# Patient Record
Sex: Female | Born: 1950 | Race: White | Hispanic: No | Smoking: Former smoker
Health system: Southern US, Community
[De-identification: ages and names within clinical notes are randomized; demographics above are authoritative.]

## PROBLEM LIST (undated history)

## (undated) DIAGNOSIS — K219 Gastro-esophageal reflux disease without esophagitis: Secondary | ICD-10-CM

## (undated) DIAGNOSIS — R569 Unspecified convulsions: Secondary | ICD-10-CM

## (undated) DIAGNOSIS — M519 Unspecified thoracic, thoracolumbar and lumbosacral intervertebral disc disorder: Secondary | ICD-10-CM

## (undated) DIAGNOSIS — E039 Hypothyroidism, unspecified: Secondary | ICD-10-CM

## (undated) DIAGNOSIS — Z8669 Personal history of other diseases of the nervous system and sense organs: Secondary | ICD-10-CM

## (undated) DIAGNOSIS — E785 Hyperlipidemia, unspecified: Secondary | ICD-10-CM

## (undated) DIAGNOSIS — R748 Abnormal levels of other serum enzymes: Secondary | ICD-10-CM

## (undated) DIAGNOSIS — M810 Age-related osteoporosis without current pathological fracture: Secondary | ICD-10-CM

## (undated) DIAGNOSIS — F17200 Nicotine dependence, unspecified, uncomplicated: Secondary | ICD-10-CM

## (undated) HISTORY — DX: Gastro-esophageal reflux disease without esophagitis: K21.9

## (undated) HISTORY — DX: Abnormal levels of other serum enzymes: R74.8

## (undated) HISTORY — DX: Hyperlipidemia, unspecified: E78.5

## (undated) HISTORY — DX: Age-related osteoporosis without current pathological fracture: M81.0

## (undated) HISTORY — DX: Unspecified thoracic, thoracolumbar and lumbosacral intervertebral disc disorder: M51.9

## (undated) HISTORY — PX: BREAST EXCISIONAL BIOPSY: SUR124

## (undated) HISTORY — PX: EYE SURGERY: SHX253

## (undated) HISTORY — DX: Hypothyroidism, unspecified: E03.9

## (undated) HISTORY — DX: Nicotine dependence, unspecified, uncomplicated: F17.200

## (undated) HISTORY — PX: REDUCTION MAMMAPLASTY: SUR839

## (undated) HISTORY — DX: Unspecified convulsions: R56.9

## (undated) HISTORY — PX: COLONOSCOPY: SHX174

## (undated) HISTORY — DX: Personal history of other diseases of the nervous system and sense organs: Z86.69

---

## 1998-07-24 ENCOUNTER — Emergency Department (HOSPITAL_COMMUNITY): Admission: EM | Admit: 1998-07-24 | Discharge: 1998-07-24 | Payer: Self-pay | Admitting: Emergency Medicine

## 2002-02-09 ENCOUNTER — Encounter: Payer: Self-pay | Admitting: Orthopedic Surgery

## 2002-02-09 ENCOUNTER — Ambulatory Visit (HOSPITAL_COMMUNITY): Admission: RE | Admit: 2002-02-09 | Discharge: 2002-02-09 | Payer: Self-pay | Admitting: Orthopedic Surgery

## 2002-02-15 ENCOUNTER — Emergency Department (HOSPITAL_COMMUNITY): Admission: EM | Admit: 2002-02-15 | Discharge: 2002-02-15 | Payer: Self-pay | Admitting: Emergency Medicine

## 2006-06-14 ENCOUNTER — Emergency Department (HOSPITAL_COMMUNITY): Admission: EM | Admit: 2006-06-14 | Discharge: 2006-06-15 | Payer: Self-pay | Admitting: Emergency Medicine

## 2009-11-20 HISTORY — PX: CHOLECYSTECTOMY: SHX55

## 2010-11-12 ENCOUNTER — Observation Stay (HOSPITAL_COMMUNITY): Admission: EM | Admit: 2010-11-12 | Discharge: 2010-11-12 | Payer: Self-pay | Source: Home / Self Care

## 2010-11-12 ENCOUNTER — Encounter (INDEPENDENT_AMBULATORY_CARE_PROVIDER_SITE_OTHER): Payer: Self-pay

## 2011-01-30 LAB — URINALYSIS, ROUTINE W REFLEX MICROSCOPIC
Bilirubin Urine: NEGATIVE
Glucose, UA: NEGATIVE mg/dL
Ketones, ur: NEGATIVE mg/dL
Leukocytes, UA: NEGATIVE
Nitrite: NEGATIVE
Protein, ur: NEGATIVE mg/dL
Specific Gravity, Urine: 1.013 (ref 1.005–1.030)
Urobilinogen, UA: 0.2 mg/dL (ref 0.0–1.0)
pH: 6 (ref 5.0–8.0)

## 2011-01-30 LAB — COMPREHENSIVE METABOLIC PANEL
AST: 29 U/L (ref 0–37)
Albumin: 4.2 g/dL (ref 3.5–5.2)
Alkaline Phosphatase: 113 U/L (ref 39–117)
BUN: 7 mg/dL (ref 6–23)
GFR calc Af Amer: >60 mL/min (ref >60)
Potassium: 3.8 mEq/L (ref 3.5–5.1)
Total Protein: 7.3 g/dL (ref 6.0–8.3)

## 2011-01-30 LAB — DIFFERENTIAL
Basophils Relative: 0 % (ref 0–1)
Eosinophils Relative: 4 % (ref 0–5)
Lymphocytes Relative: 22 % (ref 12–46)
Monocytes Absolute: 1.2 10*3/uL — ABNORMAL HIGH (ref 0.1–1.0)
Monocytes Relative: 11 % (ref 3–12)
Neutro Abs: 6.8 10*3/uL (ref 1.7–7.7)

## 2011-01-30 LAB — CBC
MCV: 93.4 fL (ref 78.0–100.0)
Platelets: 265 10*3/uL (ref 150–400)
RBC: 4.38 MIL/uL (ref 3.87–5.11)
RDW: 13.6 % (ref 11.5–15.5)
WBC: 10.9 10*3/uL — ABNORMAL HIGH (ref 4.0–10.5)

## 2011-01-30 LAB — URINE MICROSCOPIC-ADD ON

## 2011-02-21 NOTE — Discharge Summary (Signed)
  NAME:  Lori Montoya, BUCHANAN                 ACCOUNT NO.:  [id number]  MEDICAL RECORD NO.:  [id number]          PATIENT TYPE:  OBV  LOCATION:  5120                         FACILITY:  [facility]  PHYSICIAN:  Abigail Miyamoto, M.D. DATE OF BIRTH:  04/24/1951  DATE OF ADMISSION:  11/12/2010 DATE OF DISCHARGE:  11/13/2010                              DISCHARGE SUMMARY   DISCHARGE DIAGNOSIS:  Acute cholecystitis with cholelithiasis.  SUMMARY OF HISTORY:  This is a 60 year old female who presents with right upper quadrant abdominal pain.  She was found on ultrasound to have findings consistent with acute cholecystitis and cholelithiasis. Decision was made to admit to the hospital.  HOSPITAL COURSE:  The patient was admitted and taken to the operating room on November 12, 2010.  She underwent a laparoscopic cholecystectomy.  She tolerated this well and was taken to the regular surgical floor.  She had an uneventful postoperative recovery and on postoperative day #1, was doing quite well.  Her incisions were healing well and the decision was made to discharge the patient home.  DISCHARGE DIET:  Regular.  DISCHARGE ACTIVITY:  As tolerated.  She is to do no heavy lifting for 2 weeks after discharge.  She may shower.  She will follow up with [facility] in 2 weeks post discharge.     Abigail Miyamoto, M.D.     DB/MEDQ  D:  12/22/2010  T:  12/23/2010  Job:  [id number]  Electronically Signed by Abigail Miyamoto M.D. on 01/04/2011 07:28:35 PM

## 2014-05-18 ENCOUNTER — Institutional Professional Consult (permissible substitution): Payer: Self-pay | Admitting: Cardiovascular Disease

## 2014-05-20 ENCOUNTER — Encounter: Payer: Self-pay | Admitting: Physician Assistant

## 2014-06-01 LAB — HM COLONOSCOPY

## 2014-06-12 ENCOUNTER — Other Ambulatory Visit: Payer: Self-pay | Admitting: Physician Assistant

## 2014-06-12 DIAGNOSIS — N951 Menopausal and female climacteric states: Secondary | ICD-10-CM

## 2014-06-12 DIAGNOSIS — Z1231 Encounter for screening mammogram for malignant neoplasm of breast: Secondary | ICD-10-CM

## 2014-07-02 ENCOUNTER — Ambulatory Visit
Admission: RE | Admit: 2014-07-02 | Discharge: 2014-07-02 | Disposition: A | Discharge status: Home / Self Care | Payer: BC Managed Care – PPO | Source: Ambulatory Visit | Attending: Physician Assistant | Admitting: Physician Assistant

## 2014-07-02 DIAGNOSIS — Z1231 Encounter for screening mammogram for malignant neoplasm of breast: Secondary | ICD-10-CM

## 2014-07-02 DIAGNOSIS — N951 Menopausal and female climacteric states: Secondary | ICD-10-CM

## 2014-07-02 LAB — HM DEXA SCAN: HM DEXA SCAN: ABNORMAL

## 2014-07-20 ENCOUNTER — Encounter: Payer: Self-pay | Admitting: Cardiology

## 2014-07-20 ENCOUNTER — Ambulatory Visit (INDEPENDENT_AMBULATORY_CARE_PROVIDER_SITE_OTHER): Payer: BC Managed Care – PPO | Admitting: Cardiology

## 2014-07-20 VITALS — BP 118/80 | HR 66 | Ht 67.0 in | Wt 140.0 lb

## 2014-07-20 DIAGNOSIS — I4949 Other premature depolarization: Secondary | ICD-10-CM

## 2014-07-20 DIAGNOSIS — R002 Palpitations: Secondary | ICD-10-CM | POA: Insufficient documentation

## 2014-07-20 DIAGNOSIS — I493 Ventricular premature depolarization: Secondary | ICD-10-CM

## 2014-07-20 NOTE — Patient Instructions (Signed)
Your physician recommends that you continue on your current medications as directed. Please refer to the Current Medication list given to you today.  Your physician has requested that you have an echocardiogram. Echocardiography is a painless test that uses sound waves to create images of your heart. It provides your doctor with information about the size and shape of your heart and how well your heart's chambers and valves are working. This procedure takes approximately one hour. There are no restrictions for this procedure.  Your physician has recommended that you wear a holter monitor. Holter monitors are medical devices that record the heart's electrical activity. Doctors most often use these monitors to diagnose arrhythmias. Arrhythmias are problems with the speed or rhythm of the heartbeat. The monitor is a small, portable device. You can wear one while you do your normal daily activities. This is usually used to diagnose what is causing palpitations/syncope (passing out). 48 hour  Follow up as needed

## 2014-07-20 NOTE — Progress Notes (Signed)
Lori Montoya Date of Birth:  1951-08-28 [place] [address] [place], [place]  [address] [phone]        Fax   [phone]   History of Present Illness: This pleasant 63 year old woman is seen at the request of Dr. Lisbeth Ply for evaluation of an irregular pulse.  The patient has no history of any known heart disease.  On the morning of June 29 the patient was aware of her heart missing multiple beats.  She called her PCP and obtained an appointment for that afternoon.  By the time she was seen in the office however her heart rhythm had returned to normal and her EKG was unremarkable.  The patient had a colonoscopy on July 13.  She was being monitored for her heart during the procedure.  The monitor showed normal sinus rhythm with bigeminy PVCs.  Since then the patient has not been aware of any subsequent cardiac arrhythmias.  She has been evaluated for hoarseness by Dr. Wilburn Cornelia of the ENT Department who has advised her to cut way back on caffeine intake.  He also suspected acid reflux as a cause of her hoarseness and recommended that she elevate the head of her bed.  Prior to his advice, the patient states that she was drinking coffee "all day long".  Now she has just one large cup of regular coffee in the morning and thereafter drinks decaf the rest of the day. The patient has not been experiencing any chest pain or shortness of breath.  She has not had any dizziness or syncope. Her social history reveals that she has been a widow since 2010.  Her husband was a Software engineer.  The patient is busy taking care of her mother who is an invalid and she also helps to look after her mother-in-law who lives next door. The patient does not drink alcohol.  She quit smoking 3 weeks ago when she began to have problems with hoarseness.  Current Outpatient Prescriptions  Medication Sig Dispense Refill  . Calcium Carbonate ([employer] CALCIUM) 1500 MG TABS Take by mouth.      .  Cholecalciferol (VITAMIN D3) 1000 UNITS CAPS Take by mouth 2 (two) times daily.      Marland Kitchen levothyroxine (SYNTHROID, LEVOTHROID) 88 MCG tablet       . omeprazole (PRILOSEC) 40 MG capsule       . alendronate (FOSAMAX) 70 MG tablet        No current facility-administered medications for this visit.    Not on File  Patient Active Problem List   Diagnosis Date Noted  . Intermittent palpitations 07/20/2014  . 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 Current Some Day Smoker  Smokeless tobacco  . Not on file    Comment: havent smoked in three weeks    History  Alcohol Use: Not on file    Family History  Problem Relation Age of Onset  . Hyperlipidemia Mother   . Hypertension Mother   . CVA Mother   . Cancer - Other Father     Review of Systems: Constitutional: no fever chills diaphoresis or fatigue or change in weight.  Head and neck: no hearing loss, no epistaxis, no photophobia or visual disturbance. Respiratory: No cough, shortness of breath or wheezing. Cardiovascular: No chest pain peripheral edema, palpitations. Gastrointestinal: No abdominal distention, no abdominal pain, no change in bowel habits hematochezia or melena. Genitourinary: No dysuria, no frequency, no urgency, no  nocturia. Musculoskeletal:No arthralgias, no back pain, no gait disturbance or myalgias. Neurological: No dizziness, no headaches, no numbness, no seizures, no syncope, no weakness, no tremors. Hematologic: No lymphadenopathy, no easy bruising. Psychiatric: No confusion, no hallucinations, no sleep disturbance.    Physical Exam: Filed Vitals:   07/20/14 1447  BP: 118/80  Pulse: 66   this is a well-developed well-nourished healthy-appearing woman in no distress.  Voice is slightly hoarse.  Head and neck exam reveals that the pupils are equal and reactive.  The extraocular movements are full.  There is no scleral icterus.  Mouth and pharynx are benign.  No lymphadenopathy.   No carotid bruits.  The jugular venous pressure is normal.  Thyroid is not enlarged or tender.  Chest is clear to percussion and auscultation.  No rales or rhonchi.  Expansion of the chest is symmetrical.  Heart reveals no abnormal lift or heave.  First and second heart sounds are normal.  There is no murmur gallop rub or click.  The abdomen is soft and nontender.  Bowel sounds are normoactive.  There is no hepatosplenomegaly or mass.  There are no abdominal bruits.  Extremities reveal no phlebitis or edema.  Pedal pulses are good.  There is no cyanosis or clubbing.  Neurologic exam is normal strength and no lateralizing weakness.  No sensory deficits.  Integument reveals no rash  EKG today shows normal sinus rhythm with sinus arrhythmia and left axis deviation and incomplete right bundle branch block. Review of her rhythm strips which she brought with her from her July 13 colonoscopy showed normal sinus rhythm with long runs of bigeminy PVCs  Assessment / Plan: 1. palpitations secondary to probable bigeminy PVCs.  Her PVCs have decreased since she cut back on caffeine intake. 2.  Hypothyroidism 3. tobacco abuse.  Nonsmoker for the past 3 weeks. 4. hoarseness possibly secondary to esophageal reflux  Recommendation: We will have the patient return for a two-dimensional echocardiogram.  We will also have her wear a 48 hour Holter monitor to assure that she is not having any worse arrhythmias than just the bigeminy PVCs. No medications were prescribed today. Many thanks for the opportunity to see this pleasant woman with you.  I will be in touch regarding the results of her studies.

## 2014-07-21 ENCOUNTER — Other Ambulatory Visit: Payer: Self-pay | Admitting: *Deleted

## 2014-07-22 LAB — HEPATIC FUNCTION PANEL
ALT: 38 U/L — AB (ref 7–35)
AST: 38 U/L — AB (ref 13–35)
Alkaline Phosphatase: 201 U/L — AB (ref 25–125)
BILIRUBIN, TOTAL: 0.3 mg/dL

## 2014-07-22 LAB — TSH: TSH: 0.49 u[IU]/mL (ref <=5.90)

## 2014-07-22 LAB — HM PAP SMEAR

## 2014-07-22 LAB — CBC AND DIFFERENTIAL
HCT: 42 % (ref 36–46)
HEMOGLOBIN: 13.2 g/dL (ref 12.0–16.0)
Platelets: 266 10*3/uL (ref 150–399)
WBC: 7.7 10^3/mL

## 2014-07-22 LAB — BASIC METABOLIC PANEL
BUN: 8 mg/dL (ref 4–21)
Creatinine: 0.6 mg/dL (ref <=1.1)
GLUCOSE: 84 mg/dL
POTASSIUM: 4.5 mmol/L (ref 3.4–5.3)
Sodium: 138 mmol/L (ref 137–147)

## 2014-07-22 LAB — LIPID PANEL
Cholesterol: 232 mg/dL — AB (ref 0–200)
HDL: 34 mg/dL — AB (ref 35–70)
LDL Cholesterol: 165 mg/dL
TRIGLYCERIDES: 163 mg/dL — AB (ref 40–160)

## 2014-07-22 LAB — VITAMIN D 25 HYDROXY (VIT D DEFICIENCY, FRACTURES): Vit D, 25-Hydroxy: 32.4

## 2014-08-10 ENCOUNTER — Ambulatory Visit (HOSPITAL_COMMUNITY): Payer: BC Managed Care – PPO | Attending: Cardiology

## 2014-08-10 ENCOUNTER — Encounter: Payer: Self-pay | Admitting: *Deleted

## 2014-08-10 ENCOUNTER — Encounter (INDEPENDENT_AMBULATORY_CARE_PROVIDER_SITE_OTHER): Payer: BC Managed Care – PPO

## 2014-08-10 DIAGNOSIS — I080 Rheumatic disorders of both mitral and aortic valves: Secondary | ICD-10-CM | POA: Diagnosis not present

## 2014-08-10 DIAGNOSIS — I4949 Other premature depolarization: Secondary | ICD-10-CM

## 2014-08-10 DIAGNOSIS — I493 Ventricular premature depolarization: Secondary | ICD-10-CM

## 2014-08-10 DIAGNOSIS — Z87891 Personal history of nicotine dependence: Secondary | ICD-10-CM | POA: Insufficient documentation

## 2014-08-10 DIAGNOSIS — R002 Palpitations: Secondary | ICD-10-CM

## 2014-08-10 NOTE — Progress Notes (Signed)
2D Echo completed. 08/10/2014

## 2014-08-10 NOTE — Progress Notes (Signed)
Patient ID: Lori Montoya, female   DOB: April 24, 1951, 63 y.o.   MRN: [id number] [employer] 48 hour holter monitor applied to patient.

## 2014-08-19 ENCOUNTER — Telehealth: Payer: Self-pay | Admitting: *Deleted

## 2014-08-19 NOTE — Telephone Encounter (Signed)
Advised patient of results.  

## 2014-08-19 NOTE — Telephone Encounter (Signed)
Left message to call back   48 hour monitored reviewed by  Dr. Mare Ferrari, interpretation:  Rare PVC's and PAC's, no dangerous arrhythmia

## 2014-08-31 ENCOUNTER — Other Ambulatory Visit: Payer: Self-pay | Admitting: Family Medicine

## 2014-08-31 DIAGNOSIS — R1011 Right upper quadrant pain: Secondary | ICD-10-CM

## 2014-09-08 ENCOUNTER — Ambulatory Visit
Admission: RE | Admit: 2014-09-08 | Discharge: 2014-09-08 | Disposition: A | Discharge status: Home / Self Care | Payer: BC Managed Care – PPO | Source: Ambulatory Visit | Attending: Family Medicine | Admitting: Family Medicine

## 2014-09-08 DIAGNOSIS — R1011 Right upper quadrant pain: Secondary | ICD-10-CM

## 2014-12-30 ENCOUNTER — Encounter (HOSPITAL_BASED_OUTPATIENT_CLINIC_OR_DEPARTMENT_OTHER): Payer: Self-pay | Admitting: *Deleted

## 2014-12-30 NOTE — Progress Notes (Signed)
No labs needed Saw dr Mare Ferrari 9/15 for pvc-no meds started

## 2015-01-01 ENCOUNTER — Encounter (HOSPITAL_BASED_OUTPATIENT_CLINIC_OR_DEPARTMENT_OTHER): Payer: Self-pay | Admitting: *Deleted

## 2015-01-01 ENCOUNTER — Ambulatory Visit (HOSPITAL_BASED_OUTPATIENT_CLINIC_OR_DEPARTMENT_OTHER): Payer: BLUE CROSS/BLUE SHIELD | Admitting: Anesthesiology

## 2015-01-01 ENCOUNTER — Encounter (HOSPITAL_BASED_OUTPATIENT_CLINIC_OR_DEPARTMENT_OTHER)
Admission: RE | Disposition: A | Discharge status: Home / Self Care | Payer: Self-pay | Source: Ambulatory Visit | Attending: Otolaryngology

## 2015-01-01 ENCOUNTER — Ambulatory Visit (HOSPITAL_BASED_OUTPATIENT_CLINIC_OR_DEPARTMENT_OTHER)
Admission: RE | Admit: 2015-01-01 | Discharge: 2015-01-01 | Disposition: A | Discharge status: Home / Self Care | Payer: BLUE CROSS/BLUE SHIELD | Source: Ambulatory Visit | Attending: Otolaryngology | Admitting: Otolaryngology

## 2015-01-01 DIAGNOSIS — J382 Nodules of vocal cords: Secondary | ICD-10-CM

## 2015-01-01 DIAGNOSIS — D141 Benign neoplasm of larynx: Secondary | ICD-10-CM | POA: Diagnosis not present

## 2015-01-01 DIAGNOSIS — J449 Chronic obstructive pulmonary disease, unspecified: Secondary | ICD-10-CM | POA: Diagnosis not present

## 2015-01-01 DIAGNOSIS — E039 Hypothyroidism, unspecified: Secondary | ICD-10-CM | POA: Insufficient documentation

## 2015-01-01 DIAGNOSIS — Z87891 Personal history of nicotine dependence: Secondary | ICD-10-CM | POA: Diagnosis not present

## 2015-01-01 HISTORY — PX: MICROLARYNGOSCOPY: SHX5208

## 2015-01-01 LAB — POCT HEMOGLOBIN-HEMACUE: HEMOGLOBIN: 10.6 g/dL — AB (ref 12.0–15.0)

## 2015-01-01 SURGERY — MICROLARYNGOSCOPY
Anesthesia: General | Site: Mouth | Laterality: Left

## 2015-01-01 MED ORDER — FENTANYL CITRATE 0.05 MG/ML IJ SOLN
INTRAMUSCULAR | Status: DC | PRN
  Administered 2015-01-01 (×2): 50 ug via INTRAVENOUS
  Administered 2015-01-01: 25 ug via INTRAVENOUS

## 2015-01-01 MED ORDER — MIDAZOLAM HCL 2 MG/2ML IJ SOLN: 1.0000 mg | INTRAMUSCULAR | Status: DC | PRN

## 2015-01-01 MED ORDER — LIDOCAINE-EPINEPHRINE 1 %-1:100000 IJ SOLN
INTRAMUSCULAR | Status: AC
  Filled 2015-01-01: qty 1

## 2015-01-01 MED ORDER — DEXAMETHASONE SODIUM PHOSPHATE 4 MG/ML IJ SOLN
INTRAMUSCULAR | Status: DC | PRN
  Administered 2015-01-01: 10 mg via INTRAVENOUS

## 2015-01-01 MED ORDER — MIDAZOLAM HCL 5 MG/5ML IJ SOLN
INTRAMUSCULAR | Status: DC | PRN
  Administered 2015-01-01: 2 mg via INTRAVENOUS

## 2015-01-01 MED ORDER — LIDOCAINE HCL (CARDIAC) 10 MG/ML IV SOLN
INTRAVENOUS | Status: DC | PRN
  Administered 2015-01-01: 60 mg via INTRAVENOUS

## 2015-01-01 MED ORDER — EPINEPHRINE HCL 1 MG/ML IJ SOLN
INTRAMUSCULAR | Status: DC | PRN
  Administered 2015-01-01: 1 mg

## 2015-01-01 MED ORDER — SUCCINYLCHOLINE CHLORIDE 20 MG/ML IJ SOLN
INTRAMUSCULAR | Status: DC | PRN
  Administered 2015-01-01: 100 mg via INTRAVENOUS

## 2015-01-01 MED ORDER — ACETAMINOPHEN 325 MG PO TABS
ORAL_TABLET | ORAL | Status: AC
  Filled 2015-01-01: qty 2

## 2015-01-01 MED ORDER — ACETAMINOPHEN 325 MG PO TABS
650.0000 mg | ORAL_TABLET | Freq: Once | ORAL | Status: AC
  Administered 2015-01-01: 650 mg via ORAL

## 2015-01-01 MED ORDER — LIDOCAINE HCL 4 % MT SOLN
OROMUCOSAL | Status: DC | PRN
  Administered 2015-01-01: 3 mL via TOPICAL

## 2015-01-01 MED ORDER — MIDAZOLAM HCL 2 MG/2ML IJ SOLN
INTRAMUSCULAR | Status: AC
  Filled 2015-01-01: qty 2

## 2015-01-01 MED ORDER — HYDROMORPHONE HCL 1 MG/ML IJ SOLN: 0.2500 mg | INTRAMUSCULAR | Status: DC | PRN

## 2015-01-01 MED ORDER — FENTANYL CITRATE 0.05 MG/ML IJ SOLN
INTRAMUSCULAR | Status: AC
  Filled 2015-01-01: qty 6

## 2015-01-01 MED ORDER — LACTATED RINGERS IV SOLN
INTRAVENOUS | Status: DC
  Administered 2015-01-01: 08:00:00 via INTRAVENOUS

## 2015-01-01 MED ORDER — ONDANSETRON HCL 4 MG/2ML IJ SOLN: 4.0000 mg | Freq: Once | INTRAMUSCULAR | Status: DC | PRN

## 2015-01-01 MED ORDER — ONDANSETRON HCL 4 MG/2ML IJ SOLN
INTRAMUSCULAR | Status: DC | PRN
  Administered 2015-01-01: 4 mg via INTRAVENOUS

## 2015-01-01 MED ORDER — OXYCODONE HCL 5 MG PO TABS: 5.0000 mg | ORAL_TABLET | Freq: Once | ORAL | Status: DC | PRN

## 2015-01-01 MED ORDER — SCOPOLAMINE 1 MG/3DAYS TD PT72
1.0000 | MEDICATED_PATCH | TRANSDERMAL | Status: DC
  Administered 2015-01-01 (×2): 1.5 mg via TRANSDERMAL

## 2015-01-01 MED ORDER — PROPOFOL 10 MG/ML IV BOLUS
INTRAVENOUS | Status: DC | PRN
  Administered 2015-01-01: 200 mg via INTRAVENOUS

## 2015-01-01 MED ORDER — EPINEPHRINE HCL 1 MG/ML IJ SOLN
INTRAMUSCULAR | Status: AC
  Filled 2015-01-01: qty 1

## 2015-01-01 MED ORDER — OXYCODONE HCL 5 MG/5ML PO SOLN: 5.0000 mg | Freq: Once | ORAL | Status: DC | PRN

## 2015-01-01 MED ORDER — FENTANYL CITRATE 0.05 MG/ML IJ SOLN: 50.0000 ug | INTRAMUSCULAR | Status: DC | PRN

## 2015-01-01 MED ORDER — SCOPOLAMINE 1 MG/3DAYS TD PT72
MEDICATED_PATCH | TRANSDERMAL | Status: AC
  Filled 2015-01-01: qty 1

## 2015-01-01 SURGICAL SUPPLY — 22 items
CANISTER SUCT 1200ML W/VALVE (MISCELLANEOUS) ×2 IMPLANT
CONT SPEC 4OZ CLIKSEAL STRL BL (MISCELLANEOUS) IMPLANT
GLOVE BIOGEL M 7.0 STRL (GLOVE) ×2 IMPLANT
GLOVE SURG SS PI 7.0 STRL IVOR (GLOVE) ×1 IMPLANT
GOWN STRL REUS W/ TWL LRG LVL3 (GOWN DISPOSABLE) IMPLANT
GOWN STRL REUS W/TWL LRG LVL3 (GOWN DISPOSABLE) ×2
GUARD TEETH (MISCELLANEOUS) IMPLANT
MARKER SKIN DUAL TIP RULER LAB (MISCELLANEOUS) IMPLANT
NDL SAFETY ECLIPSE 18X1.5 (NEEDLE) IMPLANT
NDL SPNL 22GX7 QUINCKE BK (NEEDLE) IMPLANT
NEEDLE HYPO 18GX1.5 SHARP (NEEDLE) ×2
NEEDLE SPNL 22GX7 QUINCKE BK (NEEDLE) IMPLANT
PAD ALCOHOL SWAB (MISCELLANEOUS) IMPLANT
PATTIES SURGICAL .5 X3 (DISPOSABLE) ×2 IMPLANT
SHEET MEDIUM DRAPE 40X70 STRL (DRAPES) ×2 IMPLANT
SOLUTION BUTLER CLEAR DIP (MISCELLANEOUS) ×1 IMPLANT
SPONGE GAUZE 4X4 12PLY STER LF (GAUZE/BANDAGES/DRESSINGS) ×2 IMPLANT
SURGILUBE 2OZ TUBE FLIPTOP (MISCELLANEOUS) IMPLANT
SYR 5ML LL (SYRINGE) IMPLANT
SYR CONTROL 10ML LL (SYRINGE) IMPLANT
TOWEL OR 17X24 6PK STRL BLUE (TOWEL DISPOSABLE) ×2 IMPLANT
TUBE CONNECTING 20X1/4 (TUBING) ×2 IMPLANT

## 2015-01-01 NOTE — Anesthesia Preprocedure Evaluation (Signed)
Anesthesia Evaluation  Patient identified by MRN, date of birth, ID band Patient awake    Reviewed: Allergy & Precautions, NPO status , Patient's Chart, lab work & pertinent test results  Airway Mallampati: II  TM Distance: >3 FB Neck ROM: Full  Mouth opening: Limited Mouth Opening  Dental  (+) Teeth Intact, Dental Advisory Given   Pulmonary COPDformer smoker,  breath sounds clear to auscultation        Cardiovascular Rhythm:Regular Rate:Normal     Neuro/Psych    GI/Hepatic   Endo/Other  Hypothyroidism   Renal/GU      Musculoskeletal   Abdominal   Peds  Hematology   Anesthesia Other Findings   Reproductive/Obstetrics                             Anesthesia Physical Anesthesia Plan  ASA: II  Anesthesia Plan: General   Post-op Pain Management:    Induction: Intravenous  Airway Management Planned: Oral ETT  Additional Equipment:   Intra-op Plan:   Post-operative Plan: Extubation in OR  Informed Consent: I have reviewed the patients History and Physical, chart, labs and discussed the procedure including the risks, benefits and alternatives for the proposed anesthesia with the patient or authorized representative who has indicated his/her understanding and acceptance.   Dental advisory given  Plan Discussed with: CRNA, Anesthesiologist and Surgeon  Anesthesia Plan Comments:         Anesthesia Quick Evaluation

## 2015-01-01 NOTE — H&P (Signed)
Lori Montoya is an 64 y.o. female.   Chief Complaint: Chronic hoarseness HPI: Vocal cord nodule, Chronic hoarseness  Past Medical History  Diagnosis Date  . Personal history of other disorders of nervous system and sense organs   . Unspecified hypothyroidism   . Other and unspecified disc disorder of lumbar region   . Tobacco use disorder   . COPD (chronic obstructive pulmonary disease)   . Dysrhythmia     hx pvc pac    Past Surgical History  Procedure Laterality Date  . Cholecystectomy  2011    lap choli  . Colonoscopy      Family History  Problem Relation Age of Onset  . Hyperlipidemia Mother   . Hypertension Mother   . CVA Mother   . Cancer - Other Father    Social History:  reports that she quit smoking about 2 months ago. She does not have any smokeless tobacco history on file. She reports that she does not drink alcohol or use illicit drugs.  Allergies: No Known Allergies  Medications Prior to Admission  Medication Sig Dispense Refill  . alendronate (FOSAMAX) 70 MG tablet     . Calcium Carbonate ([employer] CALCIUM) 1500 MG TABS Take by mouth.    . Cholecalciferol (VITAMIN D3) 1000 UNITS CAPS Take by mouth 2 (two) times daily.    Marland Kitchen levothyroxine (SYNTHROID, LEVOTHROID) 88 MCG tablet     . omeprazole (PRILOSEC) 40 MG capsule     . Ox Bile Extract POWD by Does not apply route.      Results for orders placed or performed during the hospital encounter of 01/01/15 (from the past 48 hour(s))  Hemoglobin-hemacue, POC     Status: Abnormal   Collection Time: 01/01/15  7:13 AM  Result Value Ref Range   Hemoglobin 10.6 (L) 12.0 - 15.0 g/dL   No results found.  Review of Systems  Constitutional: Negative.   HENT: Negative.        Chronic hoarseness  Respiratory: Negative.   Cardiovascular: Negative.   Gastrointestinal: Negative.     Blood pressure 126/53, pulse 71, temperature 97.5 F (36.4 C), temperature source Oral, resp. rate 18, height 5\' 7"  (1.702 m), weight  63.504 kg (140 lb), SpO2 100 %. Physical Exam  Constitutional: She is oriented to person, place, and time. She appears well-developed and well-nourished.  HENT:  Vocal cord nodule  Neck: Normal range of motion. Neck supple.  Cardiovascular: Normal rate.   Respiratory: Effort normal.  GI: Soft.  Musculoskeletal: Normal range of motion.  Neurological: She is alert and oriented to person, place, and time.     Assessment/Plan Adm for OP Micro-DL and exc bx  Lori Montoya 01/01/2015, 7:31 AM

## 2015-01-01 NOTE — Anesthesia Postprocedure Evaluation (Signed)
  Anesthesia Post-op Note  Patient: Lori Montoya  Procedure(s) Performed: Procedure(s): MICROLARYNGOSCOPY WITH EXCISION OF LEFT VOCAL CORD NODULE  (Left)  Patient Location: PACU  Anesthesia Type: General   Level of Consciousness: awake, alert  and oriented  Airway and Oxygen Therapy: Patient Spontanous Breathing  Post-op Pain: none  Post-op Assessment: Post-op Vital signs reviewed  Post-op Vital Signs: Reviewed  Last Vitals:  Filed Vitals:   01/01/15 0939  BP: 143/64  Pulse: 73  Temp: 36.5 C  Resp: 16    Complications: No apparent anesthesia complications

## 2015-01-01 NOTE — Transfer of Care (Signed)
Immediate Anesthesia Transfer of Care Note  Patient: Lori Montoya  Procedure(s) Performed: Procedure(s): MICROLARYNGOSCOPY WITH EXCISION OF LEFT VOCAL CORD NODULE  (Left)  Patient Location: PACU  Anesthesia Type:General  Level of Consciousness: awake, sedated and patient cooperative  Airway & Oxygen Therapy: Patient Spontanous Breathing and Patient connected to face mask oxygen  Post-op Assessment: Report given to RN and Post -op Vital signs reviewed and stable  Post vital signs: Reviewed and stable  Last Vitals:  Filed Vitals:   01/01/15 0615  BP: 126/53  Pulse: 71  Temp: 36.4 C  Resp: 18    Complications: No apparent anesthesia complications

## 2015-01-01 NOTE — Discharge Instructions (Signed)

## 2015-01-01 NOTE — Anesthesia Procedure Notes (Signed)
Procedure Name: Intubation Date/Time: 01/01/2015 7:41 AM Performed by: Lyndee Leo Pre-anesthesia Checklist: Patient identified, Emergency Drugs available, Suction available and Patient being monitored Patient Re-evaluated:Patient Re-evaluated prior to inductionOxygen Delivery Method: Circle System Utilized Preoxygenation: Pre-oxygenation with 100% oxygen Intubation Type: IV induction Ventilation: Mask ventilation without difficulty Tube type: Oral Tube size: 6.0 mm Number of attempts: 1 Airway Equipment and Method: Stylet,  Oral airway and Video-laryngoscopy Placement Confirmation: ETT inserted through vocal cords under direct vision,  positive ETCO2 and breath sounds checked- equal and bilateral Secured at: 20 cm Tube secured with: Tape Dental Injury: Teeth and Oropharynx as per pre-operative assessment  Comments: Small mouth

## 2015-01-01 NOTE — Brief Op Note (Signed)
01/01/2015  8:12 AM  PATIENT:  Lori Montoya  64 y.o. female  PRE-OPERATIVE DIAGNOSIS:  VOCAL CORD NODULE  POST-OPERATIVE DIAGNOSIS:  VOCAL CORD NODULE  PROCEDURE:  Procedure(s): MICROLARYNGOSCOPY WITH EXCISION OF LEFT VOCAL CORD NODULE  (Left)  SURGEON:  Surgeon(s) and Role:    * Jerrell Belfast, MD - Primary  PHYSICIAN ASSISTANT:   ASSISTANTS: none   ANESTHESIA:   general  EBL:   None  BLOOD ADMINISTERED:none  DRAINS: none   LOCAL MEDICATIONS USED:  LIDOCAINE TOPICAL  and Amount: 1.5 ml  SPECIMEN:  Source of Specimen:  Left VC  DISPOSITION OF SPECIMEN:  PATHOLOGY  COUNTS:  YES  TOURNIQUET:  * No tourniquets in log *  DICTATION: .Other Dictation: Dictation Number [id number]  PLAN OF CARE: Discharge to home after PACU  PATIENT DISPOSITION:  PACU - hemodynamically stable.   Delay start of Pharmacological VTE agent (>24hrs) due to surgical blood loss or risk of bleeding: not applicable

## 2015-01-01 NOTE — Progress Notes (Signed)
VS done @ 0910, documented @ 1939

## 2015-01-05 ENCOUNTER — Encounter (HOSPITAL_BASED_OUTPATIENT_CLINIC_OR_DEPARTMENT_OTHER): Payer: Self-pay | Admitting: Otolaryngology

## 2015-01-05 NOTE — Op Note (Signed)
NAME:  Lori Montoya, Lori Montoya                 ACCOUNT NO.:  [id number]  MEDICAL RECORD NO.:  [id number]  LOCATION:                               FACILITY:  [facility]  PHYSICIAN:  Early Chars. Wilburn Cornelia, M.D.DATE OF BIRTH:  01-Aug-1951  DATE OF PROCEDURE:  01/01/2015 DATE OF DISCHARGE:  01/01/2015                              OPERATIVE REPORT   PREOPERATIVE DIAGNOSES: 1. Chronic hoarseness. 2. Left vocal cord cyst.  POSTOPERATIVE DIAGNOSES: 1. Chronic hoarseness. 2. Left vocal cord cyst.  INDICATION FOR SURGERY: 1. Chronic hoarseness. 2. Left vocal cord cyst.  PROCEDURE:  Microlaryngoscopy with excision of left vocal cord cyst.  SURGEON:  Early Chars. Wilburn Cornelia, MD  ANESTHESIA:  General endotracheal.  COMPLICATIONS:  None.  BLOOD LOSS:  None.  The patient was transferred from operating room to recovery room in stable condition.  BRIEF HISTORY:  The patient is a 64 year old white female that has been followed with a history of chronic hoarseness.  She is a long-term smoker and had noted gradual worsening in her vocal quality.  She was examined in the office with flexible laryngoscopy and found to have significant post glottic erythema consistent with reflux and a soft tissue nodule involving the mid aspect of the left vocal cord.  As her reflux symptoms were treated, her voice gradually improved but she continued to have low-grade hoarseness.  Reexamination revealed persistent left vocal cord nodule and given her history and findings, I recommended that we undertake microlaryngoscopy and excision with biopsy.  The risks and benefits of the procedure were discussed in detail with the patient and her family and they understood and concurred with our plan for surgery which is scheduled on elective basis under general anesthesia.  DESCRIPTION OF PROCEDURE:  The patient was brought to the operating room on January 01, 2015, placed in supine position on the operating table. General endotracheal  anesthesia was established without difficulty. When the patient was adequately anesthetized, she was positioned, prepped and draped.  The patient's airway was examined using a Dedo laryngoscope which was gently inserted after placement of a tooth guard. There were no loose or broken teeth and no evidence of mucosal ulcer, mass or lesion.  The laryngoscope was inserted full-length with excellent visualization of the larynx and vocal cords in post glottic region.  The microscope was moved into position, and the microlaryngoscopy suspension was placed without difficulty.  With the patient in excellent position and good visualization of the vocal cords, micro laryngoscopy was undertaken.  Visualizing through the microscope, the patient was found to have a soft tissue submucosal mass involving the mid aspect of the left vocal cord. Findings were consistent with a vocal cord cyst, no evidence of infiltrative mass or tumor, no ulcer, and no bleeding.  Using a cup forceps and a microlaryngoscopy scissors, the overlying mucosa was incised from the superior surface of the mass.  There was a gelatinous mucoid material which was suctioned.  Multiple biopsies were taken and sent to pathology for gross microscopic evaluation.  The cyst was completely evacuated, and the mucosa was redraped over the vocal cord itself. There was minimal bleeding, and topical adrenalin patty was placed on the vocal  cords.  There was no further bleeding.  The patty was removed.  The patient's larynx was then sprayed with 2% topical liquid lidocaine, a total of 1.5 mL was applied and then suctioned. The patient had no further bleeding, and no evidence of additional mucosal mass or tumor.  The laryngoscope was carefully withdrawn, again no loose or broken teeth.  The oral cavity was intact.  The patient was awakened from anesthetic, extubated, and then transferred from the operating room to recovery room in stable condition.   There were no complications.  The blood loss was minimal.          ______________________________ Early Chars. Wilburn Cornelia, M.D.     DLS/MEDQ  D:  81/85/6314  T:  01/02/2015  Job:  [id number]

## 2015-07-29 ENCOUNTER — Other Ambulatory Visit: Payer: Self-pay | Admitting: Family Medicine

## 2015-07-29 DIAGNOSIS — Z1231 Encounter for screening mammogram for malignant neoplasm of breast: Secondary | ICD-10-CM

## 2015-09-02 ENCOUNTER — Ambulatory Visit
Admission: RE | Admit: 2015-09-02 | Discharge: 2015-09-02 | Disposition: A | Discharge status: Home / Self Care | Payer: BLUE CROSS/BLUE SHIELD | Source: Ambulatory Visit | Attending: Family Medicine | Admitting: Family Medicine

## 2015-09-02 DIAGNOSIS — Z1231 Encounter for screening mammogram for malignant neoplasm of breast: Secondary | ICD-10-CM

## 2015-09-07 ENCOUNTER — Other Ambulatory Visit: Payer: Self-pay | Admitting: Family Medicine

## 2015-09-07 DIAGNOSIS — N644 Mastodynia: Secondary | ICD-10-CM

## 2016-03-08 DIAGNOSIS — E039 Hypothyroidism, unspecified: Secondary | ICD-10-CM | POA: Diagnosis not present

## 2016-07-31 ENCOUNTER — Other Ambulatory Visit: Payer: Self-pay | Admitting: Family Medicine

## 2016-07-31 DIAGNOSIS — M810 Age-related osteoporosis without current pathological fracture: Secondary | ICD-10-CM | POA: Diagnosis not present

## 2016-07-31 DIAGNOSIS — Z9181 History of falling: Secondary | ICD-10-CM | POA: Diagnosis not present

## 2016-07-31 DIAGNOSIS — Z1159 Encounter for screening for other viral diseases: Secondary | ICD-10-CM | POA: Diagnosis not present

## 2016-07-31 DIAGNOSIS — Z0000 Encounter for general adult medical examination without abnormal findings: Secondary | ICD-10-CM | POA: Diagnosis not present

## 2016-07-31 DIAGNOSIS — E7800 Pure hypercholesterolemia, unspecified: Secondary | ICD-10-CM | POA: Diagnosis not present

## 2016-07-31 DIAGNOSIS — E559 Vitamin D deficiency, unspecified: Secondary | ICD-10-CM | POA: Diagnosis not present

## 2016-07-31 DIAGNOSIS — Z23 Encounter for immunization: Secondary | ICD-10-CM | POA: Diagnosis not present

## 2016-07-31 DIAGNOSIS — Z6823 Body mass index (BMI) 23.0-23.9, adult: Secondary | ICD-10-CM | POA: Diagnosis not present

## 2016-07-31 DIAGNOSIS — E039 Hypothyroidism, unspecified: Secondary | ICD-10-CM | POA: Diagnosis not present

## 2016-07-31 DIAGNOSIS — R7989 Other specified abnormal findings of blood chemistry: Secondary | ICD-10-CM | POA: Diagnosis not present

## 2016-08-01 LAB — HM HEPATITIS C SCREENING LAB: HM Hepatitis Screen: NEGATIVE

## 2016-08-08 ENCOUNTER — Ambulatory Visit: Payer: BLUE CROSS/BLUE SHIELD

## 2016-08-09 ENCOUNTER — Ambulatory Visit
Admission: RE | Admit: 2016-08-09 | Discharge: 2016-08-09 | Disposition: A | Discharge status: Home / Self Care | Payer: Medicare Other | Source: Ambulatory Visit | Attending: Family Medicine | Admitting: Family Medicine

## 2016-08-09 DIAGNOSIS — Z1231 Encounter for screening mammogram for malignant neoplasm of breast: Secondary | ICD-10-CM | POA: Diagnosis not present

## 2016-08-09 DIAGNOSIS — M810 Age-related osteoporosis without current pathological fracture: Secondary | ICD-10-CM | POA: Diagnosis not present

## 2016-08-09 DIAGNOSIS — Z780 Asymptomatic menopausal state: Secondary | ICD-10-CM | POA: Diagnosis not present

## 2016-09-04 ENCOUNTER — Ambulatory Visit: Payer: Medicare Other | Admitting: Family Medicine

## 2016-09-12 ENCOUNTER — Ambulatory Visit (INDEPENDENT_AMBULATORY_CARE_PROVIDER_SITE_OTHER): Payer: Medicare Other | Admitting: Family Medicine

## 2016-09-12 ENCOUNTER — Encounter: Payer: Self-pay | Admitting: Family Medicine

## 2016-09-12 VITALS — BP 142/84 | HR 82 | Ht 67.0 in | Wt 153.5 lb

## 2016-09-12 DIAGNOSIS — E039 Hypothyroidism, unspecified: Secondary | ICD-10-CM | POA: Insufficient documentation

## 2016-09-12 DIAGNOSIS — M51369 Other intervertebral disc degeneration, lumbar region without mention of lumbar back pain or lower extremity pain: Secondary | ICD-10-CM

## 2016-09-12 DIAGNOSIS — J449 Chronic obstructive pulmonary disease, unspecified: Secondary | ICD-10-CM

## 2016-09-12 DIAGNOSIS — R002 Palpitations: Secondary | ICD-10-CM

## 2016-09-12 DIAGNOSIS — K219 Gastro-esophageal reflux disease without esophagitis: Secondary | ICD-10-CM | POA: Insufficient documentation

## 2016-09-12 DIAGNOSIS — M5136 Other intervertebral disc degeneration, lumbar region: Secondary | ICD-10-CM

## 2016-09-12 DIAGNOSIS — E782 Mixed hyperlipidemia: Secondary | ICD-10-CM

## 2016-09-12 DIAGNOSIS — Z23 Encounter for immunization: Secondary | ICD-10-CM | POA: Diagnosis not present

## 2016-09-12 DIAGNOSIS — F17210 Nicotine dependence, cigarettes, uncomplicated: Secondary | ICD-10-CM | POA: Diagnosis not present

## 2016-09-12 DIAGNOSIS — L719 Rosacea, unspecified: Secondary | ICD-10-CM

## 2016-09-12 DIAGNOSIS — E785 Hyperlipidemia, unspecified: Secondary | ICD-10-CM | POA: Insufficient documentation

## 2016-09-12 DIAGNOSIS — R748 Abnormal levels of other serum enzymes: Secondary | ICD-10-CM

## 2016-09-12 DIAGNOSIS — M810 Age-related osteoporosis without current pathological fracture: Secondary | ICD-10-CM | POA: Insufficient documentation

## 2016-09-12 DIAGNOSIS — J382 Nodules of vocal cords: Secondary | ICD-10-CM

## 2016-09-12 NOTE — Assessment & Plan Note (Signed)
Declines spirometry or PFTs.

## 2016-09-12 NOTE — Assessment & Plan Note (Signed)
On metronidazole gel per dermatology

## 2016-09-12 NOTE — Assessment & Plan Note (Signed)
Continue Zetia. We will await FLP/ LFTs.

## 2016-09-12 NOTE — Assessment & Plan Note (Signed)
Takes calcium and only 1000 vitamin D daily per patient.    Last DEXA 9\20\17.   PCP put her on calcium and vitamin D- was told to stop her bisphosphonate medicine.Marland Kitchen

## 2016-09-12 NOTE — Assessment & Plan Note (Signed)
Nodule was Removed- benign; they also stretched her esophagus

## 2016-09-12 NOTE — Assessment & Plan Note (Signed)
Chronic in nature-levels have varied over the past 10 years or so. Patient showed me from her own personal health notebook.

## 2016-09-12 NOTE — Patient Instructions (Addendum)
Please get me your lab results from your recent physical exam in Sept 11th.  Bring me a copy from your mychart  USPSTF website and look up chest CTscans for older folks and screening for lung cancer and we can discuss positives / negatives next OV     Please realize, EXERCISE IS MEDICINE!  -  American Heart Association [facility]) guidelines for exercise : If you are in good health, without any medical conditions, you should engage in 150 minutes of moderate intensity aerobic activity per week.  This means you should be huffing and puffing throughout your workout.   Engaging in regular exercise will improve brain function and memory, as well as improve mood, boost immune system and help with weight management.  As well as the other, more well-known effects of exercise such as decreasing blood sugar levels, decreasing blood pressure,  and decreasing bad cholesterol levels/ increasing good cholesterol levels.     -  The AHA strongly endorses consumption of a diet that contains a variety of foods from all the food categories with an emphasis on fruits and vegetables; fat-free and low-fat dairy products; cereal and grain products; legumes and nuts; and fish, poultry, and/or extra lean meats.    Excessive food intake, especially of foods high in saturated and trans fats, sugar, and salt, should be avoided.    Adequate water intake of roughly 1/2 of your weight in pounds, should equal the ounces of water per day you should drink.  So for instance, if you're 200 pounds, that would be 100 ounces of water per day.         Mediterranean Diet  Why follow it? Research shows. . Those who follow the Mediterranean diet have a reduced risk of heart disease  . The diet is associated with a reduced incidence of Parkinson's and Alzheimer's diseases . People following the diet may have longer life expectancies and lower rates of chronic diseases  . The Dietary Guidelines for Americans recommends the Mediterranean diet  as an eating plan to promote health and prevent disease  What Is the Mediterranean Diet?  . Healthy eating plan based on typical foods and recipes of Mediterranean-style cooking . The diet is primarily a plant based diet; these foods should make up a majority of meals   Starches - Plant based foods should make up a majority of meals - They are an important sources of vitamins, minerals, energy, antioxidants, and fiber - Choose whole grains, foods high in fiber and minimally processed items  - Typical grain sources include wheat, oats, barley, corn, brown rice, bulgar, farro, millet, polenta, couscous  - Various types of beans include chickpeas, lentils, fava beans, black beans, white beans   Fruits  Veggies - Large quantities of antioxidant rich fruits & veggies; 6 or more servings  - Vegetables can be eaten raw or lightly drizzled with oil and cooked  - Vegetables common to the traditional Mediterranean Diet include: artichokes, arugula, beets, broccoli, brussel sprouts, cabbage, carrots, celery, collard greens, cucumbers, eggplant, kale, leeks, lemons, lettuce, mushrooms, okra, onions, peas, peppers, potatoes, pumpkin, radishes, rutabaga, shallots, spinach, sweet potatoes, turnips, zucchini - Fruits common to the Mediterranean Diet include: apples, apricots, avocados, cherries, clementines, dates, figs, grapefruits, grapes, melons, nectarines, oranges, peaches, pears, pomegranates, strawberries, tangerines  Fats - Replace butter and margarine with healthy oils, such as olive oil, canola oil, and tahini  - Limit nuts to no more than a handful a day  - Nuts include walnuts, almonds, pecans,  pistachios, pine nuts  - Limit or avoid candied, honey roasted or heavily salted nuts - Olives are central to the Mediterranean diet - can be eaten whole or used in a variety of dishes   Meats Protein - Limiting red meat: no more than a few times a month - When eating red meat: choose lean cuts and keep the  portion to the size of deck of cards - Eggs: approx. 0 to 4 times a week  - Fish and lean poultry: at least 2 a week  - Healthy protein sources include, chicken, [place], lean beef, lamb - Increase intake of seafood such as tuna, salmon, trout, mackerel, shrimp, scallops - Avoid or limit high fat processed meats such as sausage and bacon  Dairy - Include moderate amounts of low fat dairy products  - Focus on healthy dairy such as fat free yogurt, skim milk, low or reduced fat cheese - Limit dairy products higher in fat such as whole or 2% milk, cheese, ice cream  Alcohol - Moderate amounts of red wine is ok  - No more than 5 oz daily for women (all ages) and men older than age 88  - No more than 10 oz of wine daily for men younger than 42  Other - Limit sweets and other desserts  - Use herbs and spices instead of salt to flavor foods  - Herbs and spices common to the traditional Mediterranean Diet include: basil, bay leaves, chives, cloves, cumin, fennel, garlic, lavender, marjoram, mint, oregano, parsley, pepper, rosemary, sage, savory, sumac, tarragon, thyme   It's not just a diet, it's a lifestyle:  . The Mediterranean diet includes lifestyle factors typical of those in the region  . Foods, drinks and meals are best eaten with others and savored . Daily physical activity is important for overall good health . This could be strenuous exercise like running and aerobics . This could also be more leisurely activities such as walking, housework, yard-work, or taking the stairs . Moderation is the key; a balanced and healthy diet accommodates most foods and drinks . Consider portion sizes and frequency of consumption of certain foods   Meal Ideas & Options:  . Breakfast:  o Whole wheat toast or whole wheat English muffins with peanut butter & hard boiled egg o Steel cut oats topped with apples & cinnamon and skim milk  o Fresh fruit: banana, strawberries, melon, berries, peaches   o Smoothies: strawberries, bananas, greek yogurt, peanut butter o Low fat greek yogurt with blueberries and granola  o Egg white omelet with spinach and mushrooms o Breakfast couscous: whole wheat couscous, apricots, skim milk, cranberries  . Sandwiches:  o Hummus and grilled vegetables (peppers, zucchini, squash) on whole wheat bread   o Grilled chicken on whole wheat pita with lettuce, tomatoes, cucumbers or tzatziki  o Tuna salad on whole wheat bread: tuna salad made with greek yogurt, olives, red peppers, capers, green onions o Garlic rosemary lamb pita: lamb sauted with garlic, rosemary, salt & pepper; add lettuce, cucumber, greek yogurt to pita - flavor with lemon juice and black pepper  . Seafood:  o Mediterranean grilled salmon, seasoned with garlic, basil, parsley, lemon juice and black pepper o Shrimp, lemon, and spinach whole-grain pasta salad made with low fat greek yogurt  o Seared scallops with lemon orzo  o Seared tuna steaks seasoned salt, pepper, coriander topped with tomato mixture of olives, tomatoes, olive oil, minced garlic, parsley, green onions and cappers  . Meats:  o Herbed greek chicken salad with kalamata olives, cucumber, feta  o Red bell peppers stuffed with spinach, bulgur, lean ground beef (or lentils) & topped with feta   o Kebabs: skewers of chicken, tomatoes, onions, zucchini, squash  o [place] burgers: made with red onions, mint, dill, lemon juice, feta cheese topped with roasted red peppers . Vegetarian o Cucumber salad: cucumbers, artichoke hearts, celery, red onion, feta cheese, tossed in olive oil & lemon juice  o Hummus and whole grain pita points with a greek salad (lettuce, tomato, feta, olives, cucumbers, red onion) o Lentil soup with celery, carrots made with vegetable broth, garlic, salt and pepper  o Tabouli salad: parsley, bulgur, mint, scallions, cucumbers, tomato, radishes, lemon juice, olive oil, salt and pepper.

## 2016-09-12 NOTE — Assessment & Plan Note (Signed)
Patient takes Tums when necessary.

## 2016-09-12 NOTE — Progress Notes (Signed)
New patient office visit note:  Impression and Recommendations:    1. Mixed hyperlipidemia   2. Smoking greater than 30 pack years   3. Hypothyroidism, unspecified type   4. presumed to have COPD   5. Gastroesophageal reflux disease without esophagitis   6. Need for prophylactic vaccination and inoculation against influenza   7. Vocal cord nodule   8. Degenerative disc disease, lumbar   9. Osteoporosis, unspecified osteoporosis type, unspecified pathological fracture presence   10. Elevated alkaline phosphatase level   11. Intermittent palpitations   12. Rosacea    I asked patient to please get me your lab results from your recent physical exam in Sept 11th.  Bring me a copy from your mychart of all of your labs etc.  Patient declines low dose CT- scan screening for lung cancer:    I directed patient to the USPSTF website and look up chest CTscans for older folks and screening for lung cancer and we can discuss positives / negatives next OV.  Vocal cord nodule Nodule was Removed- benign; they also stretched her esophagus  GERD (gastroesophageal reflux disease) Patient takes Tums when necessary.  Degenerative disc disease, lumbar Found in 2003. Occasionally has flares of her back pain.  Elevated alkaline phosphatase level Chronic in nature-levels have varied over the past 10 years or so. Patient showed me from her own personal health notebook.  Intermittent palpitations Stable. Not current issue or complaint  Smoking greater than 30 pack years Patient understands her increased risk for all cons of cancers, cardiovascular disease, etc.  AHA guidelines discussed with patient of moderate intensity exercise.  Rosacea On metronidazole gel per dermatology  Osteoporosis Takes calcium and only 1000 vitamin D daily per patient.    Last DEXA 9\20\17.   PCP put her on calcium and vitamin D- was told to stop her bisphosphonate medicine..  Hypothyroidism Currently  asymptomatic. We'll await labs from PCP to check for stability. Patient denies symptoms.  Hyperlipidemia Continue Zetia. We will await FLP/ LFTs.  COPD (chronic obstructive pulmonary disease) ? Declines spirometry or PFTs.    Orders Placed This Encounter  Procedures  . Flu vaccine HIGH DOSE PF (Fluzone High dose)    Return in about 4 weeks (around 10/10/2016). We will review all of your labwork from your PCP and recent blood work per your request and please come fasting as we might get additional blood work at that time.  The patient was counseled, risk factors were discussed, anticipatory guidance given.  Gross side effects, risk and benefits, and alternatives of medications discussed with patient.  Patient is aware that all medications have potential side effects and we are unable to predict every side effect or drug-drug interaction that may occur.  Expresses verbal understanding and consents to current therapy plan and treatment regimen.  Please see AVS handed out to patient at the end of our visit for further patient instructions/ counseling done pertaining to today's office visit.    Note: This document was prepared using Dragon voice recognition software and may include unintentional dictation errors.  ----------------------------------------------------------------------------------------------------------------------    Subjective:    Chief Complaint  Patient presents with  . Establish Care    HPI: Lori Montoya is a pleasant 65 y.o. female who presents to [place] today to review their medical history with me and establish care.   I asked the patient to review their chronic problem list with me to ensure everything  was updated and accurate.     Patient is retired area she was widowed in 2010. She has one son. Recently her mother passed in July, 2017 and was very sick for 4 years. Patient says was a blessing.  Patient quit smoking on  10-31-14. Has a 40-pack yr hx--- since 65 years old.    Never tested for COPD, but says she has chronic prod cough - esp am's and occasionally gets wheezy and easily congested in the chest.  -   Drinks 5+ c coffee per day.   Has done this for years and years   Patient Care Team    Relationship Specialty Notifications Start End  Mellody Dance, DO PCP - General Family Medicine  09/12/16   Teena Irani, MD Consulting Physician Gastroenterology  09/12/16   Jerrell Belfast, MD Consulting Physician Otolaryngology  09/12/16   Darlin Coco, MD  Cardiology  09/12/16      Wt Readings from Last 3 Encounters:  09/12/16 153 lb 8 oz (69.6 kg)  01/01/15 140 lb (63.5 kg)  07/20/14 140 lb (63.5 kg)   BP Readings from Last 3 Encounters:  09/12/16 (!) 142/84  01/01/15 (!) 143/64  07/20/14 118/80   Pulse Readings from Last 3 Encounters:  09/12/16 82  01/01/15 73  07/20/14 66   BMI Readings from Last 3 Encounters:  09/12/16 24.04 kg/m  01/01/15 21.93 kg/m  07/20/14 21.93 kg/m   No results found for: HGBA1C  Patient Active Problem List   Diagnosis Date Noted  . Smoking greater than 30 pack years 09/12/2016  . Hypothyroidism 09/12/2016  . Hyperlipidemia 09/12/2016  . COPD (chronic obstructive pulmonary disease) ? 09/12/2016  . Degenerative disc disease, lumbar 09/12/2016  . GERD (gastroesophageal reflux disease) 09/12/2016  . Osteoporosis 09/12/2016  . Elevated alkaline phosphatase level 09/12/2016  . Rosacea 09/12/2016  . 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 Intermittent palpitations 07/20/2014  . 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 COPD (chronic obstructive pulmonary disease) ([place])   . Dysrhythmia    hx pvc pac  . Elevated alkaline phosphatase level   . GERD (gastroesophageal reflux disease)   . Hyperlipidemia   . Osteoporosis   . Other and unspecified disc disorder of lumbar region   . Personal history of  other disorders of nervous system and sense organs   . Seizures ([place])   . Tobacco use disorder   . Unspecified hypothyroidism      Past Surgical History:  Procedure Laterality Date  . CHOLECYSTECTOMY  2011   lap choli  . COLONOSCOPY    . MICROLARYNGOSCOPY Left 01/01/2015   Procedure: MICROLARYNGOSCOPY WITH EXCISION OF LEFT VOCAL CORD NODULE ;  Surgeon: Jerrell Belfast, MD;  Location: [place];  Service: ENT;  Laterality: Left;     Family History  Problem Relation Age of Onset  . Hyperlipidemia Mother   . Hypertension Mother   . CVA Mother   . Alcohol abuse Mother   . Dementia Mother   . Cancer - Other Father     liver  . Alcohol abuse Brother   . ALS Brother   . Dementia Maternal Grandmother   . Stroke Maternal Grandfather      History  Drug Use No    History  Alcohol Use No    History  Smoking Status  . Former Smoker  . Packs/day: 1.00  . Years: 48.00  . Types: Cigarettes  .  Quit date: 10/31/2014  Smokeless Tobacco  . Never Used    Comment: havent smoked in three weeks    Patient's Medications  New Prescriptions   No medications on file  Previous Medications   ASPIRIN EC 81 MG TABLET    Take 81 mg by mouth daily.   CALCIUM CARBONATE ([facility] CALCIUM) 1500 MG TABS    Take by mouth.   CHOLECALCIFEROL (VITAMIN D3) 1000 UNITS CAPS    Take by mouth 2 (two) times daily.   EZETIMIBE (ZETIA) 10 MG TABLET    Take 1 tablet by mouth daily.   LEVOTHYROXINE (SYNTHROID, LEVOTHROID) 100 MCG TABLET    Take 1 tablet by mouth daily.   MAGNESIUM CITRATE 200 MG TABS    Take 1 tablet by mouth daily.   MENAQUINONE-7 (VITAMIN K2 PO)    Take 1 tablet by mouth daily.   METRONIDAZOLE (NORITATE) 1 % CREAM    Apply 1 application topically 2 (two) times daily.  Modified Medications   No medications on file  Discontinued Medications   ALENDRONATE (FOSAMAX) 70 MG TABLET       LEVOTHYROXINE (SYNTHROID, LEVOTHROID) 88 MCG TABLET       OMEPRAZOLE (PRILOSEC) 40 MG  CAPSULE       OX BILE EXTRACT POWD    by Does not apply route.    Allergies: Alendronate  Review of Systems  Constitutional: Negative.  Negative for chills, diaphoresis, fever, malaise/fatigue and weight loss.  HENT: Negative.  Negative for congestion, sore throat and tinnitus.   Eyes: Negative.  Negative for blurred vision, double vision and photophobia.  Respiratory: Negative.  Negative for cough and wheezing.   Cardiovascular: Negative.  Negative for chest pain and palpitations.  Gastrointestinal: Negative.  Negative for blood in stool, diarrhea, nausea and vomiting.  Genitourinary: Negative.  Negative for dysuria, frequency and urgency.  Musculoskeletal: Negative.  Negative for joint pain and myalgias.  Skin: Negative.  Negative for itching and rash.  Neurological: Negative.  Negative for dizziness, focal weakness, weakness and headaches.  Endo/Heme/Allergies: Negative.  Negative for environmental allergies and polydipsia. Does not bruise/bleed easily.  Psychiatric/Behavioral: Negative.  Negative for depression and memory loss. The patient is not nervous/anxious and does not have insomnia.      Objective:   Blood pressure (!) 142/84, pulse 82, height 5\' 7"  (1.702 m), weight 153 lb 8 oz (69.6 kg). Body mass index is 24.04 kg/m. General: Well Developed, well nourished, and in no acute distress.  Neuro: Alert and oriented x3, extra-ocular muscles intact, sensation grossly intact.  HEENT: Normocephalic, atraumatic, pupils equal round reactive to light, neck supple Skin: no gross suspicious lesions or rashes  Cardiac: Regular rate and rhythm, no murmurs rubs or gallops.  Respiratory: Essentially clear to auscultation bilaterally. Not using accessory muscles, speaking in full sentences.  Abdominal: Soft, not grossly distended Musculoskeletal: Ambulates w/o diff, FROM * 4 ext.  Vasc: less 2 sec cap RF, warm and pink  Psych:  No HI/SI, judgement and insight good, Euthymic mood. Full  Affect.

## 2016-09-12 NOTE — Assessment & Plan Note (Addendum)
Patient understands her increased risk for all cons of cancers, cardiovascular disease, etc.  AHA guidelines discussed with patient of moderate intensity exercise.

## 2016-09-12 NOTE — Assessment & Plan Note (Signed)
Stable. Not current issue or complaint

## 2016-09-12 NOTE — Assessment & Plan Note (Signed)
Currently asymptomatic. We'll await labs from PCP to check for stability. Patient denies symptoms.

## 2016-09-12 NOTE — Assessment & Plan Note (Signed)
Found in 2003. Occasionally has flares of her back pain.

## 2016-09-12 NOTE — Assessment & Plan Note (Signed)
>>  ASSESSMENT AND PLAN FOR GERD (GASTROESOPHAGEAL REFLUX DISEASE) WRITTEN ON 09/12/2016 11:39 PM BY OPALSKI, DEBORAH, DO  Patient takes Tums when necessary.

## 2016-09-20 NOTE — Progress Notes (Signed)
a 

## 2016-10-23 ENCOUNTER — Ambulatory Visit: Payer: Medicare Other | Admitting: Family Medicine

## 2016-11-03 ENCOUNTER — Ambulatory Visit (INDEPENDENT_AMBULATORY_CARE_PROVIDER_SITE_OTHER): Payer: Medicare Other | Admitting: Family Medicine

## 2016-11-03 ENCOUNTER — Encounter: Payer: Self-pay | Admitting: Family Medicine

## 2016-11-03 VITALS — BP 145/76 | HR 79 | Ht 67.0 in | Wt 153.4 lb

## 2016-11-03 DIAGNOSIS — F17210 Nicotine dependence, cigarettes, uncomplicated: Secondary | ICD-10-CM | POA: Diagnosis not present

## 2016-11-03 DIAGNOSIS — E559 Vitamin D deficiency, unspecified: Secondary | ICD-10-CM | POA: Insufficient documentation

## 2016-11-03 DIAGNOSIS — E781 Pure hyperglyceridemia: Secondary | ICD-10-CM

## 2016-11-03 DIAGNOSIS — E039 Hypothyroidism, unspecified: Secondary | ICD-10-CM

## 2016-11-03 DIAGNOSIS — R748 Abnormal levels of other serum enzymes: Secondary | ICD-10-CM | POA: Diagnosis not present

## 2016-11-03 DIAGNOSIS — E782 Mixed hyperlipidemia: Secondary | ICD-10-CM

## 2016-11-03 MED ORDER — ATORVASTATIN CALCIUM 40 MG PO TABS: 40.0000 mg | ORAL_TABLET | Freq: Every day | ORAL | 3 refills | Status: DC

## 2016-11-03 MED ORDER — VITAMIN D3 25 MCG (1000 UT) PO CAPS: 5000.0000 [IU] | ORAL_CAPSULE | Freq: Every day | ORAL | Status: AC

## 2016-11-03 NOTE — Assessment & Plan Note (Signed)
Con tmeds---> labs were TSH 1.5 in 07/31/16--> with her yrly CPE w prior PCP.

## 2016-11-03 NOTE — Progress Notes (Signed)
Assessment and plan:  1. Hypothyroidism, unspecified type   2. Vitamin D insufficiency   3. Elevated alkaline phosphatase level   4. Smoking greater than 30 pack years   5. Mixed hyperlipidemia   6. Hypertriglyceridemia     Elevated alkaline phosphatase level - Patient in 2014 had a liver ultrasound done and various other tests- including hepatitis C, which was negative.    Hyperlipidemia Due to her long history of smoking greater than 30 years and quitting 2 years ago, due to her total cholesterol 241, LDL 163, HDL 44, triglycerides 169, VLDL 34,   I recommend we put her on atorvastatin. Recheck ALT AST in 6-8 weeks.    Hypothyroidism Con tmeds---> labs were TSH 1.5 in 07/31/16--> with her yrly CPE w prior PCP.   Long history of smoking - asymptomatic. rec pfts in near future.   Consider bp meds.   New Prescriptions   ATORVASTATIN (LIPITOR) 40 MG TABLET    Take 1 tablet (40 mg total) by mouth at bedtime.    Modified Medications   Modified Medication Previous Medication   CHOLECALCIFEROL (VITAMIN D3) 1000 UNITS CAPS Cholecalciferol (VITAMIN D3) 1000 UNITS CAPS      Take 5 capsules (5,000 Units total) by mouth daily.    Take by mouth 2 (two) times daily.    Discontinued Medications   No medications on file     No Follow-up on file.  Anticipatory guidance and routine counseling done re: condition, txmnt options and need for follow up. All questions of patient's were answered.   Gross side effects, risk and benefits, and alternatives of medications discussed with patient.  Patient is aware that all medications have potential side effects and we are unable to predict every sideeffect or drug-drug interaction that may occur.  Expresses verbal understanding and consents to current therapy plan and treatment regiment.  Please see AVS handed out to patient at the end of our visit for additional patient  instructions/ counseling done pertaining to today's office visit.  Note: This document was prepared using Dragon voice recognition software and may include unintentional dictation errors.   ----------------------------------------------------------------------------------------------------------------------  Subjective:   CC:   Lori Montoya is a 65 y.o. female who presents to [facility] today for review and discussion of recent bloodwork that was done.  1. All recent blood work that we ordered was reviewed with patient today.  Patient was counseled on all abnormalities and we discussed dietary and lifestyle changes that could help those values (also medications when appropriate).  Extensive health counseling performed and all patient's concerns/ questions were addressed.   Last office visit she was told to- Return in about 4 weeks (around 10/10/2016). We will review all of your labwork from your PCP and recent blood work per your request and please come fasting as we might get additional blood work at that time   -  BP at home running- in the 110s to 130s over 60s to 70s for the most part. Occasionally, especially if upset or agitated, will be 152/75 or 144/75 at highest levels- transient elevation.    Patient's problem that she has quit caffeine completely as well. Thinks that this is helped her blood pressures well.   F/u hypothyroidism and hyperlipidemia - energy is good; not on cholesterol lowering medications. Diet changes/recommendations discussed. wieght is up. Wt Readings from Last 3 Encounters:  11/03/16 153 lb 6.4 oz (69.6 kg)  09/12/16 153  lb 8 oz (69.6 kg)  01/01/15 140 lb (63.5 kg)   BP Readings from Last 3 Encounters:  11/03/16 (!) 145/76  09/12/16 (!) 142/84  01/01/15 (!) 143/64   Pulse Readings from Last 3 Encounters:  11/03/16 79  09/12/16 82  01/01/15 73   BMI Readings from Last 3 Encounters:  11/03/16 24.03 kg/m  09/12/16 24.04  kg/m  01/01/15 21.93 kg/m     Patient Care Team    Relationship Specialty Notifications Start End  Mellody Dance, DO PCP - General Family Medicine  09/12/16   Teena Irani, MD Consulting Physician Gastroenterology  09/12/16   Jerrell Belfast, MD Consulting Physician Otolaryngology  09/12/16   Darlin Coco, MD  Cardiology  09/12/16     Full medical history updated and reviewed in the office today  Patient Active Problem List   Diagnosis Date Noted  . Vitamin D insufficiency 11/03/2016  . Smoking greater than 30 pack years 09/12/2016  . Hypothyroidism 09/12/2016  . Hyperlipidemia 09/12/2016  . COPD (chronic obstructive pulmonary disease) ? 09/12/2016  . Degenerative disc disease, lumbar 09/12/2016  . GERD (gastroesophageal reflux disease) 09/12/2016  . Osteoporosis 09/12/2016  . Elevated alkaline phosphatase level 09/12/2016  . Rosacea 09/12/2016  . 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 Intermittent palpitations 07/20/2014  . 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 COPD (chronic obstructive pulmonary disease) ([place])   . Dysrhythmia    hx pvc pac  . Elevated alkaline phosphatase level   . GERD (gastroesophageal reflux disease)   . Hyperlipidemia   . Osteoporosis   . Other and unspecified disc disorder of lumbar region   . Personal history of other disorders of nervous system and sense organs   . Seizures ([place])   . Tobacco use disorder   . Unspecified hypothyroidism     Past Surgical History:  Procedure Laterality Date  . CHOLECYSTECTOMY  2011   lap choli  . COLONOSCOPY    . MICROLARYNGOSCOPY Left 01/01/2015   Procedure: MICROLARYNGOSCOPY WITH EXCISION OF LEFT VOCAL CORD NODULE ;  Surgeon: Jerrell Belfast, MD;  Location: [facility];  Service: ENT;  Laterality: Left;    Social History  Substance Use Topics  . Smoking status: Former Smoker    Packs/day: 1.00    Years: 48.00    Types:  Cigarettes    Quit date: 10/31/2014  . Smokeless tobacco: Never Used     Comment: havent smoked in three weeks  . Alcohol use No    Family Hx: Family History  Problem Relation Age of Onset  . Hyperlipidemia Mother   . Hypertension Mother   . CVA Mother   . Alcohol abuse Mother   . Dementia Mother   . Hypothyroidism Mother   . Cancer - Other Father     liver  . Alcohol abuse Brother   . ALS Brother   . Dementia Maternal Grandmother   . Stroke Maternal Grandfather      Medications: Current Outpatient Prescriptions  Medication Sig Dispense Refill  . aspirin EC 81 MG tablet Take 81 mg by mouth daily.    . Calcium Carbonate ([employer] CALCIUM) 1500 MG TABS Take by mouth.    . Cholecalciferol (VITAMIN D3) 1000 units CAPS Take 5 capsules (5,000 Units total) by mouth daily. 60 capsule   . ezetimibe (ZETIA) 10 MG tablet Take 1 tablet by mouth daily.  4  . levothyroxine (SYNTHROID, LEVOTHROID) 100 MCG tablet  Take 1 tablet by mouth daily.  0  . Magnesium Citrate 200 MG TABS Take 1 tablet by mouth daily.    . Menaquinone-7 (VITAMIN K2 PO) Take 1 tablet by mouth daily.    . metronidazole (NORITATE) 1 % cream Apply 1 application topically 2 (two) times daily.    Marland Kitchen atorvastatin (LIPITOR) 40 MG tablet Take 1 tablet (40 mg total) by mouth at bedtime. 90 tablet 3   No current facility-administered medications for this visit.     Allergies:  Allergies  Allergen Reactions  . Alendronate Other (See Comments)    Bone pain     ROS: Review of Systems  Constitutional: Negative for chills, fever and malaise/fatigue.  HENT: Negative for ear pain and tinnitus.   Eyes: Negative for blurred vision, double vision and photophobia.  Respiratory: Negative for cough and shortness of breath.   Cardiovascular: Negative for chest pain and palpitations.  Gastrointestinal: Negative for abdominal pain, constipation, diarrhea, heartburn, nausea and vomiting.  Musculoskeletal: Positive for back pain.  Negative for joint pain, myalgias and neck pain.       Chronic problem  Neurological: Negative for dizziness and headaches.    Objective:  Blood pressure (!) 145/76, pulse 79, height 5\' 7"  (1.702 m), weight 153 lb 6.4 oz (69.6 kg). Body mass index is 24.03 kg/m. Gen:   Well NAD, A and O *3 HEENT:    [place]/AT, EOMI,  MMM Lungs:   Normal work of breathing. CTA B/L, no Wh, rhonchi Heart:   RRR, S1, S2 WNL's, no MRG Abd:   No gross distention Exts:    warm, pink,  Brisk capillary refill, warm and well perfused.  Psych:    No HI/SI, judgement and insight good, Euthymic mood. Full Affect.   No results found for this or any previous visit (from the past 2160 hour(s)).

## 2016-11-03 NOTE — Assessment & Plan Note (Signed)
-   Patient in 2014 had a liver ultrasound done and various other tests- including hepatitis C, which was negative.

## 2016-11-03 NOTE — Assessment & Plan Note (Signed)
Due to her long history of smoking greater than 30 years and quitting 2 years ago, due to her total cholesterol 241, LDL 163, HDL 44, triglycerides 169, VLDL 34,   I recommend we put her on atorvastatin. Recheck ALT AST in 6-8 weeks.

## 2016-11-03 NOTE — Patient Instructions (Addendum)
ALk phos levels still up-- repeat in couple months for stability. - FLP, ALK PHos, TSH, VIT D, cbc, A1c--> in MArch  Please realize, EXERCISE IS MEDICINE!  -  American Heart Association [facility]) guidelines for exercise : If you are in good health, without any medical conditions, you should engage in 150 minutes of moderate intensity aerobic activity per week.  This means you should be huffing and puffing throughout your workout.   Engaging in regular exercise will improve brain function and memory, as well as improve mood, boost immune system and help with weight management.  As well as the other, more well-known effects of exercise such as decreasing blood sugar levels, decreasing blood pressure,  and decreasing bad cholesterol levels/ increasing good cholesterol levels.     -  The AHA strongly endorses consumption of a diet that contains a variety of foods from all the food categories with an emphasis on fruits and vegetables; fat-free and low-fat dairy products; cereal and grain products; legumes and nuts; and fish, poultry, and/or extra lean meats.    Excessive food intake, especially of foods high in saturated and trans fats, sugar, and salt, should be avoided.    Adequate water intake of roughly 1/2 of your weight in pounds, should equal the ounces of water per day you should drink.  So for instance, if you're 200 pounds, that would be 100 ounces of water per day.        Guidelines for a Low Cholesterol, Low Saturated Fat Diet   Fats - Limit total intake of fats and oils. - Avoid butter, stick margarine, shortening, lard, palm and coconut oils. - Limit mayonnaise, salad dressings, gravies and sauces, unless they are homemade with low-fat ingredients. - Limit chocolate. - Choose low-fat and nonfat products, such as low-fat mayonnaise, low-fat or non-hydrogenated peanut butter, low-fat or fat-free salad dressings and nonfat gravy. - Use vegetable oil, such as canola or olive oil. - Look for  margarine that does not contain trans fatty acids. - Use nuts in moderate amounts. - Read ingredient labels carefully to determine both amount and type of fat present in foods. Limit saturated and trans fats! - Avoid high-fat processed and convenience foods.  Meats and Meat Alternatives - Choose fish, chicken, [place] and lean meats. - Use dried beans, peas, lentils and tofu. - Limit egg yolks to three to four per week. - If you eat red meat, limit to no more than three servings per week and choose loin or round cuts. - Avoid fatty meats, such as bacon, sausage, franks, luncheon meats and ribs. - Avoid all organ meats, including liver.  Dairy - Choose nonfat or low-fat milk, yogurt and cottage cheese. - Most cheeses are high in fat. Choose cheeses made from non-fat milk, such as mozzarella and ricotta cheese. - Choose light or fat-free cream cheese and sour cream. - Avoid cream and sauces made with cream.  Fruits and Vegetables - Eat a wide variety of fruits and vegetables. - Use lemon juice, vinegar or "mist" olive oil on vegetables. - Avoid adding sauces, fat or oil to vegetables.  Breads, Cereals and Grains - Choose whole-grain breads, cereals, pastas and rice. - Avoid high-fat snack foods, such as granola, cookies, pies, pastries, doughnuts and croissants.  Cooking Tips - Avoid deep fried foods. - Trim visible fat off meats and remove skin from poultry before cooking. - Bake, broil, boil, poach or roast poultry, fish and lean meats. - Drain and discard fat that drains  out of meat as you cook it. - Add little or no fat to foods. - Use vegetable oil sprays to grease pans for cooking or baking. - Steam vegetables. - Use herbs or no-oil marinades to flavor foods.

## 2016-12-05 ENCOUNTER — Telehealth: Payer: Self-pay | Admitting: Family Medicine

## 2016-12-05 NOTE — Telephone Encounter (Signed)
Patient said she is having muscles spasms in her back and wants to talk to someone clinically about it what her options.

## 2016-12-05 NOTE — Telephone Encounter (Signed)
She is to make sure she is drinking at least half of your weight in ounces of water per day if this is one of the most important things when it comes to statin medications and preventing myalgias and arthralgias.    I encourage her to walk 20-30 minutes daily -slowly work up to this if she's not artery doing so- as it may take weeks for her to get up to this pace.  - having neck and back pain from cholesterol medicine is possible but it's likely shehas some functional pain because of twisting or moving the wrong way.  I recommend she take Flexeril 10 mg, take 1/2-1 tablet up to 3 times a day when necessary muscle spasm.  Dispense 30.    - Please make sure she does follow up in 6-8 weeks for recheck of her ALT after starting the statin medication which it looks like she probably started it on or around 11/04/2016.   Certainly, if the pains worsen or spread or become more diffuse, please let us know.

## 2016-12-05 NOTE — Telephone Encounter (Signed)
Pt states that since starting statin medication, she has been having a lot of back and neck pain.  Denies muscle pain in other areas.  Pt is requesting an RX for a muscle relaxer.  Should pt stop statin instead and monitor for improvement or does she need OV?  Charyl Bigger, CMA

## 2016-12-08 MED ORDER — CYCLOBENZAPRINE HCL 10 MG PO TABS: ORAL_TABLET | ORAL | 0 refills | Status: DC

## 2016-12-08 NOTE — Addendum Note (Signed)
Addended by: Fonnie Mu on: 12/08/2016 11:46 AM   Modules accepted: Orders

## 2016-12-08 NOTE — Telephone Encounter (Signed)
Pt informed of recommendations and need for follow up of labs end of 1/18 or beginning of 2/18.  Pt transferred to front desk to schedule appt.  Charyl Bigger, CMA

## 2016-12-22 ENCOUNTER — Other Ambulatory Visit (INDEPENDENT_AMBULATORY_CARE_PROVIDER_SITE_OTHER): Payer: Medicare Other

## 2016-12-22 DIAGNOSIS — Z833 Family history of diabetes mellitus: Secondary | ICD-10-CM

## 2016-12-22 DIAGNOSIS — E559 Vitamin D deficiency, unspecified: Secondary | ICD-10-CM

## 2016-12-22 DIAGNOSIS — R748 Abnormal levels of other serum enzymes: Secondary | ICD-10-CM

## 2016-12-22 DIAGNOSIS — F17210 Nicotine dependence, cigarettes, uncomplicated: Secondary | ICD-10-CM

## 2016-12-22 DIAGNOSIS — Z1321 Encounter for screening for nutritional disorder: Secondary | ICD-10-CM | POA: Diagnosis not present

## 2016-12-22 DIAGNOSIS — M818 Other osteoporosis without current pathological fracture: Secondary | ICD-10-CM

## 2016-12-22 DIAGNOSIS — E038 Other specified hypothyroidism: Secondary | ICD-10-CM | POA: Diagnosis not present

## 2016-12-22 DIAGNOSIS — E782 Mixed hyperlipidemia: Secondary | ICD-10-CM

## 2016-12-23 LAB — VITAMIN D 25 HYDROXY (VIT D DEFICIENCY, FRACTURES): VIT D 25 HYDROXY: 54 ng/mL (ref 30.0–100.0)

## 2016-12-23 LAB — CBC WITH DIFFERENTIAL/PLATELET
Basophils Absolute: 0 10*3/uL (ref 0.0–0.2)
Basos: 0 %
EOS (ABSOLUTE): 0.6 10*3/uL — AB (ref 0.0–0.4)
Eos: 8 %
HEMATOCRIT: 40.7 % (ref 34.0–46.6)
Hemoglobin: 13.2 g/dL (ref 11.1–15.9)
Immature Grans (Abs): 0 10*3/uL (ref 0.0–0.1)
Immature Granulocytes: 0 %
Lymphocytes Absolute: 2.2 10*3/uL (ref 0.7–3.1)
Lymphs: 28 %
MCH: 29.9 pg (ref 26.6–33.0)
MCHC: 32.4 g/dL (ref 31.5–35.7)
MCV: 92 fL (ref 79–97)
Monocytes Absolute: 0.7 10*3/uL (ref 0.1–0.9)
Monocytes: 10 %
Neutrophils Absolute: 4.3 10*3/uL (ref 1.4–7.0)
Neutrophils: 54 %
PLATELETS: 293 10*3/uL (ref 150–379)
RBC: 4.41 x10E6/uL (ref 3.77–5.28)
RDW: 14.5 % (ref 12.3–15.4)
WBC: 7.8 10*3/uL (ref 3.4–10.8)

## 2016-12-23 LAB — COMPREHENSIVE METABOLIC PANEL
A/G RATIO: 1.3 (ref 1.2–2.2)
ALT: 75 IU/L — AB (ref 0–32)
AST: 82 IU/L — ABNORMAL HIGH (ref 0–40)
Albumin: 4.3 g/dL (ref 3.6–4.8)
Alkaline Phosphatase: 322 IU/L — ABNORMAL HIGH (ref 39–117)
BUN/Creatinine Ratio: 13 (ref 12–28)
BUN: 11 mg/dL (ref 8–27)
Bilirubin Total: 0.6 mg/dL (ref 0.0–1.2)
CO2: 25 mmol/L (ref 18–29)
Calcium: 10.1 mg/dL (ref 8.7–10.3)
Chloride: 101 mmol/L (ref 96–106)
Creatinine, Ser: 0.83 mg/dL (ref 0.57–1.00)
GFR calc Af Amer: 86 mL/min/{1.73_m2} (ref >59)
GFR calc non Af Amer: 74 mL/min/{1.73_m2} (ref >59)
GLOBULIN, TOTAL: 3.2 g/dL (ref 1.5–4.5)
Glucose: 91 mg/dL (ref 65–99)
POTASSIUM: 5.6 mmol/L — AB (ref 3.5–5.2)
SODIUM: 141 mmol/L (ref 134–144)
Total Protein: 7.5 g/dL (ref 6.0–8.5)

## 2016-12-23 LAB — TSH: TSH: 3.91 u[IU]/mL (ref 0.450–4.500)

## 2016-12-23 LAB — LIPID PANEL
Chol/HDL Ratio: 2.6 ratio units (ref 0.0–4.4)
Cholesterol, Total: 131 mg/dL (ref 100–199)
HDL: 51 mg/dL (ref >39)
LDL CALC: 61 mg/dL (ref 0–99)
Triglycerides: 96 mg/dL (ref 0–149)
VLDL CHOLESTEROL CAL: 19 mg/dL (ref 5–40)

## 2016-12-23 LAB — VITAMIN B12: Vitamin B-12: 639 pg/mL (ref 232–1245)

## 2016-12-23 LAB — HEMOGLOBIN A1C
Est. average glucose Bld gHb Est-mCnc: 105 mg/dL
Hgb A1c MFr Bld: 5.3 % (ref 4.8–5.6)

## 2016-12-27 ENCOUNTER — Other Ambulatory Visit: Payer: Self-pay | Admitting: Adult Health

## 2016-12-27 ENCOUNTER — Other Ambulatory Visit: Payer: Self-pay

## 2016-12-27 DIAGNOSIS — R748 Abnormal levels of other serum enzymes: Secondary | ICD-10-CM

## 2017-01-19 ENCOUNTER — Other Ambulatory Visit (INDEPENDENT_AMBULATORY_CARE_PROVIDER_SITE_OTHER): Payer: Medicare Other

## 2017-01-19 DIAGNOSIS — R748 Abnormal levels of other serum enzymes: Secondary | ICD-10-CM | POA: Diagnosis not present

## 2017-01-20 LAB — HEPATIC FUNCTION PANEL
ALT: 43 IU/L — ABNORMAL HIGH (ref 0–32)
AST: 45 IU/L — ABNORMAL HIGH (ref 0–40)
Albumin: 3.9 g/dL (ref 3.6–4.8)
Alkaline Phosphatase: 243 IU/L — ABNORMAL HIGH (ref 39–117)
BILIRUBIN TOTAL: 0.3 mg/dL (ref 0.0–1.2)
Bilirubin, Direct: 0.09 mg/dL (ref 0.00–0.40)
Total Protein: 7 g/dL (ref 6.0–8.5)

## 2017-01-22 ENCOUNTER — Telehealth: Payer: Self-pay | Admitting: Family Medicine

## 2017-01-22 NOTE — Telephone Encounter (Signed)
Pt cld back states she got Tonya's msg regarding (protein levels)--- but thought it was her (high Potassium levels the doctor was concerned about and wanted them tested -- Pls cll her back to confirm what was tested and explain why she was told to monitor and cut ou high Potassium foods if that isn't what was tested.  Pls call today, pt also states she read the My chart msg sent by nurse. --glh

## 2017-01-22 NOTE — Telephone Encounter (Signed)
Spoke with pt and clarified that we were checking LFTs and that a repeat potassium was not needed, she only needed to cut back on potassium intake.  Pt expressed understanding.  Charyl Bigger, CMA

## 2017-02-08 ENCOUNTER — Ambulatory Visit: Payer: Medicare Other | Admitting: Family Medicine

## 2017-02-14 ENCOUNTER — Telehealth: Payer: Self-pay | Admitting: Family Medicine

## 2017-02-14 NOTE — Telephone Encounter (Signed)
Patient walked in saying that for the past couple month she has been fatigued, muscle aches, and general tiredness. She is wondering if maybe her potassium is at a wrong level. She is requesting a full set of blood work and urine culture to see what may be going on. She would like to also speak with Kenney Houseman to talk a little more with her thoughts on what she is experiencing.

## 2017-02-14 NOTE — Telephone Encounter (Signed)
Pt advised that she must have OV for evaluation.  Pt expressed understanding and is agreeable.  Pt was transferred to front desk to schedule appt.  Charyl Bigger, CMA

## 2017-02-20 ENCOUNTER — Ambulatory Visit (INDEPENDENT_AMBULATORY_CARE_PROVIDER_SITE_OTHER): Payer: Medicare Other | Admitting: Adult Health

## 2017-02-20 ENCOUNTER — Encounter: Payer: Self-pay | Admitting: Adult Health

## 2017-02-20 VITALS — BP 123/70 | HR 73 | Ht 67.0 in | Wt 160.8 lb

## 2017-02-20 DIAGNOSIS — R5383 Other fatigue: Secondary | ICD-10-CM | POA: Insufficient documentation

## 2017-02-20 DIAGNOSIS — M791 Myalgia, unspecified site: Secondary | ICD-10-CM | POA: Insufficient documentation

## 2017-02-20 DIAGNOSIS — Z131 Encounter for screening for diabetes mellitus: Secondary | ICD-10-CM | POA: Diagnosis not present

## 2017-02-20 DIAGNOSIS — E038 Other specified hypothyroidism: Secondary | ICD-10-CM | POA: Diagnosis not present

## 2017-02-20 DIAGNOSIS — E7849 Other hyperlipidemia: Secondary | ICD-10-CM

## 2017-02-20 DIAGNOSIS — E784 Other hyperlipidemia: Secondary | ICD-10-CM | POA: Diagnosis not present

## 2017-02-20 DIAGNOSIS — E559 Vitamin D deficiency, unspecified: Secondary | ICD-10-CM | POA: Diagnosis not present

## 2017-02-20 NOTE — Patient Instructions (Signed)
Muscle Pain, Adult Muscle pain (myalgia) may be mild or severe. In most cases, the pain lasts only a short time and it goes away without treatment. It is normal to feel some muscle pain after starting a workout program. Muscles that have not been used often will be sore at first. Muscle pain may also be caused by many other things, including:  Overuse or muscle strain, especially if you are not in shape. This is the most common cause of muscle pain.  Injury.  Bruises.  Viruses, such as the flu.  Infectious diseases.  A chronic condition that causes muscle tenderness, fatigue, and headache (fibromyalgia).  A condition, such as lupus, in which the body's disease-fighting system attacks other organs in the body (autoimmune or rheumatologic diseases).  Certain drugs, including ACE inhibitors and statins. To diagnose the cause of your muscle pain, your health care provider will do a physical exam and ask questions about the pain and when it began. If you have not had muscle pain for very long, your health care provider may want to wait before doing much testing. If your muscle pain has lasted a long time, your health care provider may want to run tests right away. In some cases, this may include tests to rule out certain conditions or illnesses. Treatment for muscle pain depends on the cause. Home care is often enough to relieve muscle pain. Your health care provider may also prescribe anti-inflammatory medicine. Follow these instructions at home: Activity   If overuse is causing your muscle pain:  Slow down your activities until the pain goes away.  Do regular, gentle exercises if you are not usually active.  Warm up before exercising. Stretch before and after exercising. This can help lower the risk of muscle pain.  Do not continue working out if the pain is very bad. Bad pain could mean that you have injured a muscle. Managing pain and discomfort    If directed, apply ice to the  sore muscle:  Put ice in a plastic bag.  Place a towel between your skin and the bag.  Leave the ice on for 20 minutes, 2-3 times a day.  You may also alternate between applying ice and applying heat as told by your health care provider. To apply heat, use the heat source that your health care provider recommends, such as a moist heat pack or a heating pad.  Place a towel between your skin and the heat source.  Leave the heat on for 20-30 minutes.  Remove the heat if your skin turns bright red. This is especially important if you are unable to feel pain, heat, or cold. You may have a greater risk of getting burned. Medicines   Take over-the-counter and prescription medicines only as told by your health care provider.  Do not drive or use heavy machinery while taking prescription pain medicine. Contact a health care provider if:  Your muscle pain gets worse and medicines do not help.  You have muscle pain that lasts longer than 3 days.  You have a rash or fever along with muscle pain.  You have muscle pain after a tick bite.  You have muscle pain while working out, even though you are in good physical condition.  You have redness, soreness, or swelling along with muscle pain.  You have muscle pain after starting a new medicine or changing the dose of a medicine. Get help right away if:  You have trouble breathing.  You have trouble swallowing.  You have muscle pain along with a stiff neck, fever, and vomiting.  You have severe muscle weakness or cannot move part of your body. This information is not intended to replace advice given to you by your health care provider. Make sure you discuss any questions you have with your health care provider. Document Released: 09/28/2006 Document Revised: 05/26/2016 Document Reviewed: 03/28/2016 Elsevier Interactive Patient Education  2017 [employer] Inc[employer]   Fatigue Fatigue is feeling tired all of the time, a lack of energy, or a lack  of motivation. Occasional or mild fatigue is often a normal response to activity or life in general. However, long-lasting (chronic) or extreme fatigue may indicate an underlying medical condition. Follow these instructions at home: Watch your fatigue for any changes. The following actions may help to lessen any discomfort you are feeling:  Talk to your health care provider about how much sleep you need each night. Try to get the required amount every night.  Take medicines only as directed by your health care provider.  Eat a healthy and nutritious diet. Ask your health care provider if you need help changing your diet.  Drink enough fluid to keep your urine clear or pale yellow.  Practice ways of relaxing, such as yoga, meditation, massage therapy, or acupuncture.  Exercise regularly.  Change situations that cause you stress. Try to keep your work and personal routine reasonable.  Do not abuse illegal drugs.  Limit alcohol intake to no more than 1 drink per day for nonpregnant women and 2 drinks per day for men. One drink equals 12 ounces of beer, 5 ounces of wine, or 1 ounces of hard liquor.  Take a multivitamin, if directed by your health care provider. Contact a health care provider if:  Your fatigue does not get better.  You have a fever.  You have unintentional weight loss or gain.  You have headaches.  You have difficulty:  Falling asleep.  Sleeping throughout the night.  You feel angry, guilty, anxious, or sad.  You are unable to have a bowel movement (constipation).  You skin is dry.  Your legs or another part of your body is swollen. Get help right away if:  You feel confused.  Your vision is blurry.  You feel faint or pass out.  You have a severe headache.  You have severe abdominal, pelvic, or back pain.  You have chest pain, shortness of breath, or an irregular or fast heartbeat.  You are unable to urinate or you urinate less than  normal.  You develop abnormal bleeding, such as bleeding from the rectum, vagina, nose, lungs, or nipples.  You vomit blood.  You have thoughts about harming yourself or committing suicide.  You are worried that you might harm someone else. This information is not intended to replace advice given to you by your health care provider. Make sure you discuss any questions you have with your health care provider. Document Released: 09/03/2007 Document Revised: 04/13/2016 Document Reviewed: 03/10/2014 Elsevier Interactive Patient Education  2017 [place] all medications as directed. Continue excellent water intake, however try not consume liquids after 6pm to reduce nighttime urination. For muscle pain: Epson Salt baths, daily stretching/yoga (ie. YouTube, Pinterest), OTC Acetaminophen per manufacturer's instructions. We will call when lab work results are available. Follow-up as needed.

## 2017-02-20 NOTE — Assessment & Plan Note (Signed)
Continue all medications as directed. Continue excellent water intake, however try not consume liquids after 6pm to reduce nighttime urination. For muscle pain: Epson Salt baths, daily stretching/yoga (ie. YouTube, Pinterest), OTC Acetaminophen per manufacturer's instructions. We will call when lab work results are available. Follow-up as needed.

## 2017-02-20 NOTE — Assessment & Plan Note (Signed)
Labs obtained today, will call when results are available.

## 2017-02-20 NOTE — Progress Notes (Signed)
Subjective:    Patient ID: Lori Montoya, female    DOB: 1951/01/24, 66 y.o.   MRN: [id number]  HPI:  Lori Montoya is here for generalized fatigue and diffuse myalgia that both began mid Feb.   Atorvastatin 40mg  and Ezetimibe 10mg  were both d/c'd due to elevated LFTs around the same time.  Since then she has been walking 25miles most days of the week and drinks approx. 80 ounces water daily.  Her diet is "pretty much heart healthy".  She reports interrupted sleep due to nocturia.  Her myalgia occurs: neck, bil shoulders, bil calves and it rated 4/10, constant and described as soreness.  She denies GI upset.  She denies CP/dyspnea/dizziness/HA/neuropathy/palpitations.   Of Note:  She has been primary caregiver for the following family members with assoc. Year of death: Brother 03/15/05 Husband 03/15/09 Mother March 15, 2016 Mother in law is in her current care at age 78   Patient Care Team    Relationship Specialty Notifications Start End  Mellody Dance, DO PCP - General Family Medicine  09/12/16   Teena Irani, MD Consulting Physician Gastroenterology  09/12/16   Jerrell Belfast, MD Consulting Physician Otolaryngology  09/12/16   Darlin Coco, MD  Cardiology  09/12/16     Patient Active Problem List   Diagnosis Date Noted  . Other fatigue 02/20/2017  . Myalgia 02/20/2017  . Vitamin D insufficiency 11/03/2016  . Smoking greater than 30 pack years 09/12/2016  . Hypothyroidism 09/12/2016  . Hyperlipidemia 09/12/2016  . COPD (chronic obstructive pulmonary disease) ? 09/12/2016  . Degenerative disc disease, lumbar 09/12/2016  . GERD (gastroesophageal reflux disease) 09/12/2016  . Osteoporosis 09/12/2016  . Elevated alkaline phosphatase level 09/12/2016  . Rosacea 09/12/2016  . 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 Intermittent palpitations 07/20/2014  . 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 COPD (chronic obstructive pulmonary disease) ([place])    . Dysrhythmia    hx pvc pac  . Elevated alkaline phosphatase level   . GERD (gastroesophageal reflux disease)   . Hyperlipidemia   . Osteoporosis   . Other and unspecified disc disorder of lumbar region   . Personal history of other disorders of nervous system and sense organs   . Seizures ([place])   . Tobacco use disorder   . Unspecified hypothyroidism      Past Surgical History:  Procedure Laterality Date  . CHOLECYSTECTOMY  03-15-10   lap choli  . COLONOSCOPY    . MICROLARYNGOSCOPY Left 01/01/2015   Procedure: MICROLARYNGOSCOPY WITH EXCISION OF LEFT VOCAL CORD NODULE ;  Surgeon: Jerrell Belfast, MD;  Location: [facility];  Service: ENT;  Laterality: Left;     Family History  Problem Relation Age of Onset  . Hyperlipidemia Mother   . Hypertension Mother   . CVA Mother   . Alcohol abuse Mother   . Dementia Mother   . Hypothyroidism Mother   . Cancer - Other Father     liver  . Alcohol abuse Brother   . ALS Brother   . Dementia Maternal Grandmother   . Stroke Maternal Grandfather      History  Drug Use No     History  Alcohol Use No     History  Smoking Status  . Former Smoker  . Packs/day: 1.00  . Years: 48.00  . Types: Cigarettes  . Quit date: 10/31/2014  Smokeless Tobacco  . Never Used  Comment: havent smoked in three weeks     Outpatient Encounter Prescriptions as of 02/20/2017  Medication Sig Note  . Calcium Carbonate ([facility] CALCIUM) 1500 MG TABS Take by mouth.   . Cholecalciferol (VITAMIN D3) 1000 units CAPS Take 5 capsules (5,000 Units total) by mouth daily.   Marland Kitchen levothyroxine (SYNTHROID, LEVOTHROID) 100 MCG tablet Take 1 tablet by mouth daily. 09/12/2016: Received from: External Pharmacy  . Menaquinone-7 (VITAMIN K2 PO) Take 1 tablet by mouth daily.   . [DISCONTINUED] Magnesium Citrate 200 MG TABS Take 1 tablet by mouth daily.   . [DISCONTINUED] aspirin EC 81 MG tablet Take 81 mg by mouth daily.   . [DISCONTINUED] cyclobenzaprine  (FLEXERIL) 10 MG tablet 1/2 to one tablet up to three times daily as needed for muscle spasms.   . [DISCONTINUED] metronidazole (NORITATE) 1 % cream Apply 1 application topically 2 (two) times daily.    No facility-administered encounter medications on file as of 02/20/2017.     Allergies: Alendronate  Body mass index is 25.18 kg/m.  Blood pressure 123/70, pulse 73, height 5\' 7"  (1.702 m), weight 160 lb 12.8 oz (72.9 kg).     Review of Systems  Constitutional: Positive for fatigue. Negative for activity change, appetite change, chills, diaphoresis, fever and unexpected weight change.  Respiratory: Negative for cough, chest tightness, shortness of breath, wheezing and stridor.   Cardiovascular: Negative for chest pain, palpitations and leg swelling.  Gastrointestinal: Negative for abdominal distention, abdominal pain, blood in stool, constipation, diarrhea, nausea and vomiting.  Endocrine: Positive for polydipsia. Negative for cold intolerance, heat intolerance, polyphagia and polyuria.  Genitourinary: Negative for difficulty urinating, flank pain and hematuria.  Musculoskeletal: Positive for arthralgias, myalgias, neck pain and neck stiffness. Negative for back pain, gait problem and joint swelling.  Skin: Negative for color change, pallor, rash and wound.  Allergic/Immunologic: Negative for immunocompromised state.  Neurological: Negative for dizziness, tremors, weakness, light-headedness and headaches.  Hematological: Does not bruise/bleed easily.  Psychiatric/Behavioral: Positive for sleep disturbance. Negative for agitation, behavioral problems, confusion, decreased concentration, dysphoric mood, hallucinations, self-injury and suicidal ideas. The patient is not nervous/anxious and is not hyperactive.        Sleep disturbance due to nocturia: 3-4 times/night       Objective:   Physical Exam  Constitutional: She is oriented to person, place, and time. She appears well-developed  and well-nourished. No distress.  HENT:  Head: Normocephalic and atraumatic.  Right Ear: External ear normal.  Left Ear: External ear normal.  Eyes: Conjunctivae are normal. Pupils are equal, round, and reactive to light.  Neck: Normal range of motion. Neck supple.  Cardiovascular: Normal rate, regular rhythm, normal heart sounds and intact distal pulses.   No murmur heard. Pulmonary/Chest: Effort normal and breath sounds normal. No respiratory distress. She has no wheezes. She has no rales. She exhibits no tenderness.  Abdominal: Soft. Bowel sounds are normal. She exhibits no distension and no mass. There is no tenderness. There is no rebound and no guarding.  Musculoskeletal: Normal range of motion. She exhibits tenderness.  Lymphadenopathy:    She has no cervical adenopathy.  Neurological: She is alert and oriented to person, place, and time. Coordination normal.  Skin: Skin is warm and dry. No rash noted. She is not diaphoretic. No erythema. No pallor.  Psychiatric: She has a normal mood and affect. Her behavior is normal. Judgment and thought content normal.          Assessment & Plan:   1. Other  fatigue   2. Other specified hypothyroidism   3. Vitamin D insufficiency   4. Other hyperlipidemia   5. Screening for diabetes mellitus   6. Myalgia     Myalgia Continue all medications as directed. Continue excellent water intake, however try not consume liquids after 6pm to reduce nighttime urination. For muscle pain: Epson Salt baths, daily stretching/yoga (ie. YouTube, Pinterest), OTC Acetaminophen per manufacturer's instructions. We will call when lab work results are available. Follow-up as needed.   Other fatigue Labs obtained today, will call when results are available.     FOLLOW-UP:  Return if symptoms worsen or fail to improve.

## 2017-02-21 ENCOUNTER — Other Ambulatory Visit: Payer: Self-pay | Admitting: Adult Health

## 2017-02-21 DIAGNOSIS — E7849 Other hyperlipidemia: Secondary | ICD-10-CM

## 2017-02-21 LAB — LIPID PANEL
Chol/HDL Ratio: 6.1 ratio — ABNORMAL HIGH (ref 0.0–4.4)
Cholesterol, Total: 252 mg/dL — ABNORMAL HIGH (ref 100–199)
HDL: 41 mg/dL (ref >39)
LDL CALC: 172 mg/dL — AB (ref 0–99)
TRIGLYCERIDES: 195 mg/dL — AB (ref 0–149)
VLDL CHOLESTEROL CAL: 39 mg/dL (ref 5–40)

## 2017-02-21 LAB — CBC WITH DIFFERENTIAL/PLATELET
BASOS: 1 %
Basophils Absolute: 0 10*3/uL (ref 0.0–0.2)
EOS (ABSOLUTE): 0.3 10*3/uL (ref 0.0–0.4)
Eos: 5 %
Hematocrit: 40.4 % (ref 34.0–46.6)
Hemoglobin: 13.2 g/dL (ref 11.1–15.9)
IMMATURE GRANULOCYTES: 0 %
Immature Grans (Abs): 0 10*3/uL (ref 0.0–0.1)
Lymphocytes Absolute: 1.8 10*3/uL (ref 0.7–3.1)
Lymphs: 29 %
MCH: 29.5 pg (ref 26.6–33.0)
MCHC: 32.7 g/dL (ref 31.5–35.7)
MCV: 90 fL (ref 79–97)
MONOCYTES: 12 %
Monocytes Absolute: 0.7 10*3/uL (ref 0.1–0.9)
NEUTROS PCT: 53 %
Neutrophils Absolute: 3.4 10*3/uL (ref 1.4–7.0)
PLATELETS: 275 10*3/uL (ref 150–379)
RBC: 4.48 x10E6/uL (ref 3.77–5.28)
RDW: 13.9 % (ref 12.3–15.4)
WBC: 6.3 10*3/uL (ref 3.4–10.8)

## 2017-02-21 LAB — HEMOGLOBIN A1C
Est. average glucose Bld gHb Est-mCnc: 103 mg/dL
Hgb A1c MFr Bld: 5.2 % (ref 4.8–5.6)

## 2017-02-21 LAB — COMPREHENSIVE METABOLIC PANEL
A/G RATIO: 1.4 (ref 1.2–2.2)
ALT: 36 IU/L — ABNORMAL HIGH (ref 0–32)
AST: 47 IU/L — ABNORMAL HIGH (ref 0–40)
Albumin: 4.1 g/dL (ref 3.6–4.8)
Alkaline Phosphatase: 181 IU/L — ABNORMAL HIGH (ref 39–117)
BUN/Creatinine Ratio: 17 (ref 12–28)
BUN: 13 mg/dL (ref 8–27)
Bilirubin Total: 0.3 mg/dL (ref 0.0–1.2)
CO2: 27 mmol/L (ref 18–29)
Calcium: 9.6 mg/dL (ref 8.7–10.3)
Chloride: 97 mmol/L (ref 96–106)
Creatinine, Ser: 0.75 mg/dL (ref 0.57–1.00)
GFR calc Af Amer: 97 mL/min/{1.73_m2} (ref >59)
GFR, EST NON AFRICAN AMERICAN: 84 mL/min/{1.73_m2} (ref >59)
Globulin, Total: 3 g/dL (ref 1.5–4.5)
Glucose: 90 mg/dL (ref 65–99)
POTASSIUM: 5.4 mmol/L — AB (ref 3.5–5.2)
Sodium: 139 mmol/L (ref 134–144)
TOTAL PROTEIN: 7.1 g/dL (ref 6.0–8.5)

## 2017-02-21 LAB — VITAMIN B12: Vitamin B-12: 456 pg/mL (ref 232–1245)

## 2017-02-21 LAB — TSH: TSH: 3.07 u[IU]/mL (ref 0.450–4.500)

## 2017-02-21 LAB — VITAMIN D 25 HYDROXY (VIT D DEFICIENCY, FRACTURES): Vit D, 25-Hydroxy: 44.1 ng/mL (ref 30.0–100.0)

## 2017-02-21 MED ORDER — EZETIMIBE 10 MG PO TABS: 10.0000 mg | ORAL_TABLET | Freq: Every day | ORAL | 2 refills | Status: DC

## 2017-02-21 NOTE — Progress Notes (Unsigned)
30

## 2017-03-13 ENCOUNTER — Other Ambulatory Visit: Payer: Self-pay

## 2017-03-13 MED ORDER — LEVOTHYROXINE SODIUM 100 MCG PO TABS: 100.0000 ug | ORAL_TABLET | Freq: Every day | ORAL | 1 refills | Status: DC

## 2017-03-13 NOTE — Telephone Encounter (Signed)
We have not prescribed these medications for the patient previously.  Please review and refill if appropriate.  Pt had TSH done 02/20/17.   Charyl Bigger, CMA

## 2017-03-19 ENCOUNTER — Ambulatory Visit: Payer: Medicare Other | Admitting: Family Medicine

## 2017-03-22 ENCOUNTER — Telehealth: Payer: Self-pay | Admitting: Adult Health

## 2017-03-22 ENCOUNTER — Other Ambulatory Visit: Payer: Self-pay | Admitting: Adult Health

## 2017-03-22 DIAGNOSIS — W57XXXA Bitten or stung by nonvenomous insect and other nonvenomous arthropods, initial encounter: Secondary | ICD-10-CM

## 2017-03-22 MED ORDER — DOXYCYCLINE HYCLATE 100 MG PO TABS: 100.0000 mg | ORAL_TABLET | Freq: Two times a day (BID) | ORAL | 0 refills | Status: DC

## 2017-03-22 NOTE — Telephone Encounter (Signed)
Spoke with Lori Montoya.  She removed a tick from her back approx 2-3 days ago.  She reports that there are Deer ticks in the area.  She reports redness, itching, and warmth at site of bite. She reports a large, red, ring of redness around bite site, estimates 8cm in diameter. She denies fever/night/sweats/malaise/joint pain.  Due to known deer ticks in area and size of red ring-will put her on Doxcycline 100mg  BID for 10 d. She has f/u OV 04/03/17

## 2017-03-22 NOTE — Telephone Encounter (Signed)
Patient states she was bit by a tick on her back and needs clinical advice on what to do.

## 2017-03-22 NOTE — Telephone Encounter (Signed)
Morning Dorothea Ogle, Can you please call Ms. Hutmacher and tell her I sent in her Doxy Rx.

## 2017-03-26 DIAGNOSIS — H40033 Anatomical narrow angle, bilateral: Secondary | ICD-10-CM | POA: Diagnosis not present

## 2017-03-26 DIAGNOSIS — H2513 Age-related nuclear cataract, bilateral: Secondary | ICD-10-CM | POA: Diagnosis not present

## 2017-04-04 ENCOUNTER — Other Ambulatory Visit (INDEPENDENT_AMBULATORY_CARE_PROVIDER_SITE_OTHER): Payer: Medicare Other

## 2017-04-04 DIAGNOSIS — E7849 Other hyperlipidemia: Secondary | ICD-10-CM

## 2017-04-04 DIAGNOSIS — E784 Other hyperlipidemia: Secondary | ICD-10-CM | POA: Diagnosis not present

## 2017-04-05 LAB — LIPID PANEL
CHOLESTEROL TOTAL: 196 mg/dL (ref 100–199)
Chol/HDL Ratio: 4.6 ratio — ABNORMAL HIGH (ref 0.0–4.4)
HDL: 43 mg/dL (ref >39)
LDL Calculated: 123 mg/dL — ABNORMAL HIGH (ref 0–99)
TRIGLYCERIDES: 152 mg/dL — AB (ref 0–149)
VLDL Cholesterol Cal: 30 mg/dL (ref 5–40)

## 2017-04-05 LAB — HEPATIC FUNCTION PANEL
ALBUMIN: 4.3 g/dL (ref 3.6–4.8)
ALT: 25 IU/L (ref 0–32)
AST: 35 IU/L (ref 0–40)
Alkaline Phosphatase: 152 IU/L — ABNORMAL HIGH (ref 39–117)
BILIRUBIN TOTAL: 0.3 mg/dL (ref 0.0–1.2)
BILIRUBIN, DIRECT: 0.1 mg/dL (ref 0.00–0.40)
Total Protein: 7.1 g/dL (ref 6.0–8.5)

## 2017-05-16 ENCOUNTER — Ambulatory Visit (INDEPENDENT_AMBULATORY_CARE_PROVIDER_SITE_OTHER): Payer: Medicare Other | Admitting: Family Medicine

## 2017-05-16 ENCOUNTER — Encounter: Payer: Self-pay | Admitting: Family Medicine

## 2017-05-16 VITALS — BP 132/78 | HR 67 | Ht 67.0 in | Wt 157.7 lb

## 2017-05-16 DIAGNOSIS — F17210 Nicotine dependence, cigarettes, uncomplicated: Secondary | ICD-10-CM

## 2017-05-16 DIAGNOSIS — Z122 Encounter for screening for malignant neoplasm of respiratory organs: Secondary | ICD-10-CM | POA: Diagnosis not present

## 2017-05-16 DIAGNOSIS — Z0000 Encounter for general adult medical examination without abnormal findings: Secondary | ICD-10-CM | POA: Diagnosis not present

## 2017-05-16 DIAGNOSIS — J449 Chronic obstructive pulmonary disease, unspecified: Secondary | ICD-10-CM

## 2017-05-16 NOTE — Progress Notes (Addendum)
Impression and Recommendations:    1. Encounter for Medicare annual wellness exam   2. Chronic obstructive pulmonary disease, unspecified COPD type ([place]) Chronic  3. Smoking greater than 30 pack years   4. Encounter for screening for lung cancer    Please see orders section below for further details of actions taken during this office visit.  1) Anticipatory Guidance: Discussed importance of wearing a seatbelt while driving, not texting while driving; sunscreen when outside along with yearly skin surveillance; eating a well balanced and modest diet; physical activity at least 25 minutes per day or 150 min/ week of moderate to intense activity.  2) Immunizations / Screenings / Labs:  All immunizations and screenings that patient agrees to, are up-to-date per recommendations or will be updated today.  Patient understands the needs for q 42mo dental and yearly vision screens which pt will schedule independently. Obtain CBC, CMP, HgA1c, Lipid panel, TSH and vit D when fasting if not already done recently.   3) Weight:   Discussed goal of losing even 5-10% of current body weight which would improve overall feelings of well being and improve objective health data significantly.   Improve nutrient density of diet through increasing intake of fruits and vegetables and decreasing saturated/trans fats, white flour products and refined sugar products.   F-up preventative CPE in 1 year. F/up sooner for chronic care management as discussed and/or prn.   Orders Placed This Encounter  Procedures  . CT CHEST LUNG CANCER SCREENING LOW DOSE WO CONTRAST    Please see orders placed and AVS handed out to patient at the end of our visit for further patient instructions/ counseling done pertaining to today's office visit.     Subjective:    Chief Complaint  Patient presents with  . Medicare Wellness   CC: yrly medicare  HPI: Lori Montoya is a 66 y.o. female who presents to [facility] today a yearly health maintenance exam.  Health Maintenance Summary Reviewed and updated, unless pt declines services.  See scanned in sheet from today's visit of medicare wellness questionnaire that was r/w pt in full.   The 6CIT Dementia Test   How the test works Question Score range Weighting Weighted score        What Year is it 0-1 x4   What month is it 0-1 x3   Give the memory phrase e.g. (John/[place]/[address]/[place])     About what time is it 0-1 x3   Count back from 20-1 0-2 x2   Say months in reverse 0-2 x2   Repeat the memory phrase 0-5 x2   Total score for 6CIT 0-28       0-7 = normal - referral not necessary at present  8- 9 = mild cognitive impairment - probably refer  10-28 = significant cognitive impairment - refer       Health Maintenance  Topic Date Due  . Pneumonia vaccines (2 of 2 - PPSV23) 07/31/2017  .  Hepatitis C: One time screening is recommended by Center for Disease Control  (CDC) for  adults born from [birth year] through 1965.   11/20/2017*  . Mammogram  08/09/2018  . Tetanus Vaccine  06/05/2023  . Colon Cancer Screening  06/01/2024  . Flu Shot  Completed  . DEXA scan (bone density measurement)  Completed  *Topic was postponed. The date shown is not the original due date.     Immunization History  Administered Date(s) Administered  .  Influenza, High Dose Seasonal PF 09/12/2016, 08/27/2017  . Pneumococcal Conjugate-13 07/31/2016  . Pneumococcal Polysaccharide-23 08/14/2009  . Tdap 06/04/2013  . Zoster 12/07/2010    Wt Readings from Last 3 Encounters:  05/16/17 157 lb 11.2 oz (71.5 kg)  02/20/17 160 lb 12.8 oz (72.9 kg)  11/03/16 153 lb 6.4 oz (69.6 kg)   BP Readings from Last 3 Encounters:  05/16/17 132/78  02/20/17 123/70  11/03/16 (!) 145/76   Pulse Readings from Last 3 Encounters:  05/16/17 67  02/20/17 73  11/03/16 79     Past Medical History:  Diagnosis Date  . COPD (chronic obstructive pulmonary  disease) ([place])   . Dysrhythmia    hx pvc pac  . Elevated alkaline phosphatase level   . GERD (gastroesophageal reflux disease)   . Hyperlipidemia   . Osteoporosis   . Other and unspecified disc disorder of lumbar region   . Personal history of other disorders of nervous system and sense organs   . Seizures ([place])   . Tobacco use disorder   . Unspecified hypothyroidism       Past Surgical History:  Procedure Laterality Date  . CHOLECYSTECTOMY  2011   lap choli  . COLONOSCOPY    . MICROLARYNGOSCOPY Left 01/01/2015   Procedure: MICROLARYNGOSCOPY WITH EXCISION OF LEFT VOCAL CORD NODULE ;  Surgeon: Jerrell Belfast, MD;  Location: [facility];  Service: ENT;  Laterality: Left;      Family History  Problem Relation Age of Onset  . Hyperlipidemia Mother   . Hypertension Mother   . CVA Mother   . Alcohol abuse Mother   . Dementia Mother   . Hypothyroidism Mother   . Cancer - Other Father        liver  . Alcohol abuse Brother   . ALS Brother   . Dementia Maternal Grandmother   . Stroke Maternal Grandfather       History  Drug Use No  ,   History  Alcohol Use No  ,   History  Smoking Status  . Former Smoker  . Packs/day: 1.00  . Years: 48.00  . Types: Cigarettes  . Quit date: 10/31/2014  Smokeless Tobacco  . Never Used    Comment: havent smoked in three weeks  ,   History  Sexual Activity  . Sexual activity: No    Comment: last cig 10/31/14    Current Outpatient Prescriptions on File Prior to Visit  Medication Sig Dispense Refill  . Calcium Carbonate ([facility] CALCIUM) 1500 MG TABS Take by mouth.    . Cholecalciferol (VITAMIN D3) 1000 units CAPS Take 5 capsules (5,000 Units total) by mouth daily. 60 capsule   . Menaquinone-7 (VITAMIN K2 PO) Take 1 tablet by mouth daily.     No current facility-administered medications on file prior to visit.     Allergies: Alendronate  Review of Systems: General:   Denies fever, chills, unexplained weight  loss.  Optho/Auditory:   Denies visual changes, blurred vision/LOV Respiratory:   Denies SOB, DOE more than baseline levels.  Cardiovascular:   Denies chest pain, palpitations, new onset peripheral edema  Gastrointestinal:   Denies nausea, vomiting, diarrhea.  Genitourinary: Denies dysuria, freq/ urgency, flank pain or discharge from genitals.  Endocrine:     Denies hot or cold intolerance, polyuria, polydipsia. Musculoskeletal:   Denies unexplained myalgias, joint swelling, unexplained arthralgias, gait problems.  Skin:  Denies rash, suspicious lesions Neurological:     Denies dizziness, unexplained weakness, numbness  Psychiatric/Behavioral:  Denies mood changes, suicidal or homicidal ideations, hallucinations  Objective:   Blood pressure 132/78, pulse 67, height 5\' 7"  (1.702 m), weight 157 lb 11.2 oz (71.5 kg). Body mass index is 24.7 kg/m.  General: Well nourished, in no apparent distress. Eyes: PERRLA, EOMs, conjunctiva clr Resp: Respiratory effort- normal, ECTA B/L w/o W/R/R  Cardio: RRR w/o MRGs. Abdomen: no gross distention. Lymphatics:  less 2 sec cap RF M-sk: Full ROM, 5/5 strength, normal gait.  Skin: Warm, dry  Neuro: Alert, Oriented Psych: Normal affect, Insight and Judgment appropriate.

## 2017-05-16 NOTE — Patient Instructions (Addendum)
Please try to exercise to a gola of 29mn daily of mod intensity aerobic activity  Please realize, EXERCISE IS MEDICINE! -  American Heart Association ([facility] guidelines for exercise : If you are in good health, without any medical conditions, you should engage in 150 minutes of moderate intensity aerobic activity per week.  This means you should be huffing and puffing throughout your workout.   Engaging in regular exercise will improve brain function and memory, as well as improve mood, boost immune system and help with weight management.  As well as the other, more well-known effects of exercise such as decreasing blood sugar levels, decreasing blood pressure,  and decreasing bad cholesterol levels/ increasing good cholesterol levels.   -  The AHA strongly endorses consumption of a diet that contains a variety of foods from all the food categories with an emphasis on fruits and vegetables; fat-free and low-fat dairy products; cereal and grain products; legumes and nuts; and fish, poultry, and/or extra lean meats.    Excessive food intake, especially of foods high in saturated and trans fats, sugar, and salt, should be avoided.    Adequate water intake of roughly 1/2 of your weight in pounds, should equal the ounces of water per day you should drink.  So for instance, if you're 200 pounds, that would be 100 ounces of water per day.  Nine ways to increase your "good" HDL cholesterol High-density lipoprotein (HDL) is often referred to as the "good" cholesterol. Having high HDL levels helps carry cholesterol from your arteries to your liver, where it can be used or excreted.  Having high levels of HDL also has antioxidant and anti-inflammatory effects, and is linked to a reduced risk of heart disease (1, 2).  Most health experts recommend minimum blood levels of 40 mg/dl in men and 50 mg/dl in women.  While genetics definitely play a role, there are several other factors that affect HDL levels.  Here  are nine healthy ways to raise your "good" HDL cholesterol.  1. Consume olive oil  two pieces of salmon on a plate olive oil being poured into a small dish Extra virgin olive oil may be more healthful than processed olive oils. Olive oil is one of the healthiest fats around.  A large analysis of 42 studies with more than 800,000 participants found that olive oil was the only source of monounsaturated fat that seemed to reduce heart disease risk (3).  Research has shown that one of olive oil's heart-healthy effects is an increase in HDL cholesterol. This effect is thought to be caused by antioxidants it contains called polyphenols (4, 5, 6, 7).  Extra virgin olive oil has more polyphenols than more processed olive oils, although the amount can still vary among different types and brands.  One study gave 200 healthy young men about 2 tablespoons (25 ml) of different olive oils per day for three weeks.  The researchers found that participants' HDL levels increased significantly more after they consumed the olive oil with the highest polyphenol content (6).  In another study, when 654older adults consumed about 4 tablespoons (50 ml) of high-polyphenol extra virgin olive oil every day for six weeks, their HDL cholesterol increased by 6.5 mg/dl, on average (7).  In addition to raising HDL levels, olive oil has been found to boost HDL's anti-inflammatory and antioxidant function in studies of older people and individuals with high cholesterol levels ( 7, 8, 9).  Whenever possible, select high-quality, certified extra virgin olive oils,  which tend to be highest in polyphenols.  Bottom line: Extra virgin olive oil with a high polyphenol content has been shown to increase HDL levels in healthy people, the elderly and individuals with high cholesterol.  2. Follow a low-carb or ketogenic diet  Low-carb and ketogenic diets provide a number of health benefits, including weight loss and reduced blood  sugar levels.  They have also been shown to increase HDL cholesterol in people who tend to have lower levels.  This includes those who are obese, insulin resistant or diabetic (10, 11, 12, 13, 14, 15, 16, 17).  In one study, people with type 2 diabetes were split into two groups.  One followed a diet consuming less than 50 grams of carbs per day. The other followed a high-carb diet.  Although both groups lost weight, the low-carb group's HDL cholesterol increased almost twice as much as the high-carb group's did (14).  In another study, obese people who followed a low-carb diet experienced an increase in HDL cholesterol of 5 mg/dl overall.  Meanwhile, in the same study, the participants who ate a low-fat, high-carb diet showed a decrease in HDL cholesterol (15).  This response may partially be due to the higher levels of fat people typically consume on low-carb diets.  One study in overweight women found that diets high in meat and cheese increased HDL levels by 5-8%, compared to a higher-carb diet (18).  What's more, in addition to raising HDL cholesterol, very-low-carb diets have been shown to decrease triglycerides and improve several other risk factors for heart disease (13, 14, 16, 17).  Bottom line: Low-carb and ketogenic diets typically increase HDL cholesterol levels in people with diabetes, metabolic syndrome and obesity.  3. Exercise regularly  Being physically active is important for heart health.  Studies have shown that many different types of exercise are effective at raising HDL cholesterol, including strength training, high-intensity exercise and aerobic exercise (19, 20, 21, 22, 23, 24).  However, the biggest increases in HDL are typically seen with high-intensity exercise.  One small study followed women who were living with polycystic ovary syndrome (PCOS), which is linked to a higher risk of insulin resistance. The study required them to perform high-intensity  exercise three times a week.  The exercise led to an increase in HDL cholesterol of 8 mg/dL after 10 weeks. The women also showed improvements in other health markers, including decreased insulin resistance and improved arterial function (23).  In a 12-week study, overweight men who performed high-intensity exercise experienced a 10% increase in HDL cholesterol.  In contrast, the low-intensity exercise group showed only a 2% increase and the endurance training group experienced no change (24).  However, even lower-intensity exercise seems to increase HDL's anti-inflammatory and antioxidant capacities, whether or not HDL levels change (20, 21, 25).  Overall, high-intensity exercise such as high-intensity interval training (HIIT) and high-intensity circuit training (HICT) may boost HDL cholesterol levels the most.  Bottom line: Exercising several times per week can help raise HDL cholesterol and enhance its anti-inflammatory and antioxidant effects. High-intensity forms of exercise may be especially effective.  4. Add coconut oil to your diet  Studies have shown that coconut oil may reduce appetite, increase metabolic rate and help protect brain health, among other benefits.  Some people may be concerned about coconut oil's effects on heart health due to its high saturated fat content.  However, it appears that coconut oil is actually quite heart healthy.  Coconut oil tends to raise HDL cholesterol  more than many other types of fat.  In addition, it may improve the ratio of low-density-lipoprotein (LDL) cholesterol, the "bad" cholesterol, to HDL cholesterol. Improving this ratio reduces heart disease risk (26, 27, 28, 29).  One study examined the health effects of coconut oil on 74 women with excess belly fat. The researchers found that participants who took coconut oil daily experienced increased HDL cholesterol and a lower LDL-to-HDL ratio.  In contrast, the group who took soybean oil  daily had a decrease in HDL cholesterol and an increase in the LDL-to-HDL ratio (29).  Most studies have found these health benefits occur at a dosage of about 2 tablespoons (30 ml) of coconut oil per day. It's best to incorporate this into cooking rather than eating spoonfuls of coconut oil on their own.  Bottom line: Consuming 2 tablespoons (30 ml) of coconut oil per day may help increase HDL cholesterol levels.  5. Stop smoking  cigarette butt Quitting smoking can reduce the risk of heart disease and lung cancer. Smoking increases the risk of many health problems, including heart disease and lung cancer (30).  One of its negative effects is a suppression of HDL cholesterol.  Some studies have found that quitting smoking can increase HDL levels. Indeed, one study found no significant differences in HDL levels between former smokers and people who had never smoked (31, 32, 33, 34, 35).  In a one-year study of more than 1,500 people, those who quit smoking had twice the increase in HDL as those who resumed smoking within the year. The number of large HDL particles also increased, which further reduced heart disease risk (32).  One study followed smokers who switched from traditional cigarettes to electronic cigarettes for one year. They found that the switch was associated with an increase in HDL cholesterol of 5 mg/dl, on average (33).  When it comes to the effect of nicotine replacement patches on HDL levels, research results have been mixed.  One study found that nicotine replacement therapy led to higher HDL cholesterol. However, other research suggests that people who use nicotine patches likely won't see increases in HDL levels until after replacement therapy is completed (34, 36).  Even in studies where HDL cholesterol levels didn't increase after people quit smoking, HDL function improved, resulting in less inflammation and other beneficial effects on heart health (37).  Bottom  line: Quitting smoking can increase HDL levels, improve HDL function and help protect heart health.  6. Lose weight  When overweight and obese people lose weight, their HDL cholesterol levels usually increase.  What's more, this benefit seems to occur whether weight loss is achieved by calorie counting, carb restriction, intermittent fasting, weight loss surgery or a combination of diet and exercise (16, 38, 39, 40, 41, 42).  One study examined HDL levels in more than 3,000 overweight and obese [place] adults who followed a lifestyle modification program for one year.  The researchers found that losing at least 6.6 lbs (3 kg) led to an increase in HDL cholesterol of 4 mg/dl, on average (41).  In another study, when obese people with type 2 diabetes consumed calorie-restricted diets that provided 20-30% of calories from protein, they experienced significant increases in HDL cholesterol levels (42).  The key to achieving and maintaining healthy HDL cholesterol levels is choosing the type of diet that makes it easiest for you to lose weight and keep it off.  Bottom Line: Several methods of weight loss have been shown to increase HDL cholesterol levels in  people who are overweight or obese.  7. Choose purple produce  Consuming purple-colored fruits and vegetables is a delicious way to potentially increase HDL cholesterol.  Purple produce contains antioxidants known as anthocyanins.  Studies using anthocyanin extracts have shown that they help fight inflammation, protect your cells from damaging free radicals and may also raise HDL cholesterol levels (43, 44, 45, 46).  In a 24-week study of 15 people with diabetes, those who took an anthocyanin supplement twice a day experienced a 19% increase in HDL cholesterol, on average, along with other improvements in heart health markers (45).  In another study, when people with cholesterol issues took anthocyanin extract for 12 weeks, their HDL  cholesterol levels increased by 13.7% (46).  Although these studies used extracts instead of foods, there are several fruits and vegetables that are very high in anthocyanins. These include eggplant, purple corn, red cabbage, blueberries, blackberries and black raspberries.  Bottom line: Consuming fruits and vegetables rich in anthocyanins may help increase HDL cholesterol levels.  8. Eat fatty fish often  The omega-3 fats in fatty fish provide major benefits to heart health, including a reduction in inflammation and better functioning of the cells that line your arteries (47, 48).  There's some research showing that eating fatty fish or taking fish oil may also help raise low levels of HDL cholesterol (49, 50, 51, 52, 53).  In a study of 33 heart disease patients, participants that consumed fatty fish four times per week experienced an increase in HDL cholesterol levels. The particle size of their HDL also increased (52).  In another study, overweight men who consumed herring five days a week for six weeks had a 5% increase in HDL cholesterol, compared with their levels after eating lean pork and chicken five days a week (53).  However, there are a few studies that found no increase in HDL cholesterol in response to increased fish or omega-3 supplement intake (54, 55).  In addition to herring, other types of fatty fish that may help raise HDL cholesterol include salmon, sardines, mackerel and anchovies.  Bottom line: Eating fatty fish several times per week may help increase HDL cholesterol levels and provide other benefits to heart health.  9. Avoid artificial trans fats  Artificial trans fats have many negative health effects due to their inflammatory properties (56, 57).  There are two types of trans fats. One kind occurs naturally in animal products, including full-fat dairy.  In contrast, the artificial trans fats found in margarines and processed foods are created by adding  hydrogen to unsaturated vegetable and seed oils. These fats are also known as industrial trans fats or partially hydrogenated fats.  Research has shown that, in addition to increasing inflammation and contributing to several health problems, these artificial trans fats may lower HDL cholesterol levels.  In one study, researchers compared how people's HDL levels responded when they consumed different margarines.  The study found that participants' HDL cholesterol levels were 10% lower after consuming margarine containing partially hydrogenated soybean oil, compared to their levels after consuming palm oil (58).  Another controlled study followed 40 adults who had diets high in different types of trans fats.  They found that HDL cholesterol levels in women were significantly lower after they consumed the diet high in industrial trans fats, compared to the diet containing naturally occurring trans fats (59).  To protect heart health and keep HDL cholesterol in the healthy range, it's best to avoid artificial trans fats altogether.  Bottom line:  Artificial trans fats have been shown to lower HDL levels and increase inflammation, compared to other fats.  Take home message  Although your HDL cholesterol levels are partly determined by your genetics, there are many things you can do to naturally increase your own levels.  Fortunately, the practices that raise HDL cholesterol often provide other health benefits as well.    Guidelines for a Low Cholesterol, Low Saturated Fat Diet Fats - Limit total intake of fats and oils. - Avoid butter, stick margarine, shortening, lard, palm and coconut oils. - Limit mayonnaise, salad dressings, gravies and sauces, unless they are homemade with low-fat ingredients. - Limit chocolate. - Choose low-fat and nonfat products, such as low-fat mayonnaise, low-fat or non-hydrogenated peanut butter, low-fat or fat-free salad dressings and nonfat gravy. - Use  vegetable oil, such as canola or olive oil. - Look for margarine that does not contain trans fatty acids. - Use nuts in moderate amounts. - Read ingredient labels carefully to determine both amount and type of fat present in foods. Limit saturated and trans fats! - Avoid high-fat processed and convenience foods.  Meats and Meat Alternatives - Choose fish, chicken, [place] and lean meats. - Use dried beans, peas, lentils and tofu. - Limit egg yolks to three to four per week. - If you eat red meat, limit to no more than three servings per week and choose loin or round cuts. - Avoid fatty meats, such as bacon, sausage, franks, luncheon meats and ribs. - Avoid all organ meats, including liver.  Dairy - Choose nonfat or low-fat milk, yogurt and cottage cheese. - Most cheeses are high in fat. Choose cheeses made from non-fat milk, such as mozzarella and ricotta cheese. - Choose light or fat-free cream cheese and sour cream. - Avoid cream and sauces made with cream.  Fruits and Vegetables - Eat a wide variety of fruits and vegetables. - Use lemon juice, vinegar or "mist" olive oil on vegetables. - Avoid adding sauces, fat or oil to vegetables.  Breads, Cereals and Grains - Choose whole-grain breads, cereals, pastas and rice. - Avoid high-fat snack foods, such as granola, cookies, pies, pastries, doughnuts and croissants.  Cooking Tips - Avoid deep fried foods. - Trim visible fat off meats and remove skin from poultry before cooking. - Bake, broil, boil, poach or roast poultry, fish and lean meats. - Drain and discard fat that drains out of meat as you cook it. - Add little or no fat to foods. - Use vegetable oil sprays to grease pans for cooking or baking. - Steam vegetables. - Use herbs or no-oil marinades to flavor foods.   Preventive Care for Adults, Female  A healthy lifestyle and preventive care can promote health and wellness. Preventive health guidelines for women include the  following key practices.   A routine yearly physical is a good way to check with your health care provider about your health and preventive screening. It is a chance to share any concerns and updates on your health and to receive a thorough exam.   Visit your dentist for a routine exam and preventive care every 6 months. Brush your teeth twice a day and floss once a day. Good oral hygiene prevents tooth decay and gum disease.   The frequency of eye exams is based on your age, health, family medical history, use of contact lenses, and other factors. Follow your health care provider's recommendations for frequency of eye exams.   Eat a healthy diet. Foods like vegetables,  fruits, whole grains, low-fat dairy products, and lean protein foods contain the nutrients you need without too many calories. Decrease your intake of foods high in solid fats, added sugars, and salt. Eat the right amount of calories for you.Get information about a proper diet from your health care provider, if necessary.   Regular physical exercise is one of the most important things you can do for your health. Most adults should get at least 150 minutes of moderate-intensity exercise (any activity that increases your heart rate and causes you to sweat) each week. In addition, most adults need muscle-strengthening exercises on 2 or more days a week.   Maintain a healthy weight. The body mass index (BMI) is a screening tool to identify possible weight problems. It provides an estimate of body fat based on height and weight. Your health care provider can find your BMI, and can help you achieve or maintain a healthy weight.For adults 20 years and older:   - A BMI below 18.5 is considered underweight.   - A BMI of 18.5 to 24.9 is normal.   - A BMI of 25 to 29.9 is considered overweight.   - A BMI of 30 and above is considered obese.   Maintain normal blood lipids and cholesterol levels by exercising and minimizing your intake  of trans and saturated fats.  Eat a balanced diet with plenty of fruit and vegetables. Blood tests for lipids and cholesterol should begin at age 53 and be repeated every 5 years minimum.  If your lipid or cholesterol levels are high, you are over 40, or you are at high risk for heart disease, you may need your cholesterol levels checked more frequently.Ongoing high lipid and cholesterol levels should be treated with medicines if diet and exercise are not working.   If you smoke, find out from your health care provider how to quit. If you do not use tobacco, do not start.   Lung cancer screening is recommended for adults aged 36-80 years who are at high risk for developing lung cancer because of a history of smoking. A yearly low-dose CT scan of the lungs is recommended for people who have at least a 30-pack-year history of smoking and are a current smoker or have quit within the past 15 years. A pack year of smoking is smoking an average of 1 pack of cigarettes a day for 1 year (for example: 1 pack a day for 30 years or 2 packs a day for 15 years). Yearly screening should continue until the smoker has stopped smoking for at least 15 years. Yearly screening should be stopped for people who develop a health problem that would prevent them from having lung cancer treatment.   If you are pregnant, do not drink alcohol. If you are breastfeeding, be very cautious about drinking alcohol. If you are not pregnant and choose to drink alcohol, do not have more than 1 drink per day. One drink is considered to be 12 ounces (355 mL) of beer, 5 ounces (148 mL) of wine, or 1.5 ounces (44 mL) of liquor.   Avoid use of street drugs. Do not share needles with anyone. Ask for help if you need support or instructions about stopping the use of drugs.   High blood pressure causes heart disease and increases the risk of stroke. Your blood pressure should be checked at least yearly.  Ongoing high blood pressure should be  treated with medicines if weight loss and exercise do not work.  If you are 27-79 years old, ask your health care provider if you should take aspirin to prevent strokes.   Diabetes screening involves taking a blood sample to check your fasting blood sugar level. This should be done once every 3 years, after age 12, if you are within normal weight and without risk factors for diabetes. Testing should be considered at a younger age or be carried out more frequently if you are overweight and have at least 1 risk factor for diabetes.   Breast cancer screening is essential preventive care for women. You should practice "breast self-awareness."  This means understanding the normal appearance and feel of your breasts and may include breast self-examination.  Any changes detected, no matter how small, should be reported to a health care provider.  Women in their 60s and 30s should have a clinical breast exam (CBE) by a health care provider as part of a regular health exam every 1 to 3 years.  After age 90, women should have a CBE every year.  Starting at age 90, women should consider having a mammogram (breast X-ray test) every year.  Women who have a family history of breast cancer should talk to their health care provider about genetic screening.  Women at a high risk of breast cancer should talk to their health care providers about having an MRI and a mammogram every year.   -Breast cancer gene (BRCA)-related cancer risk assessment is recommended for women who have family members with BRCA-related cancers. BRCA-related cancers include breast, ovarian, tubal, and peritoneal cancers. Having family members with these cancers may be associated with an increased risk for harmful changes (mutations) in the breast cancer genes BRCA1 and BRCA2. Results of the assessment will determine the need for genetic counseling and BRCA1 and BRCA2 testing.   The Pap test is a screening test for cervical cancer. A Pap test can  show cell changes on the cervix that might become cervical cancer if left untreated. A Pap test is a procedure in which cells are obtained and examined from the lower end of the uterus (cervix).   - Women should have a Pap test starting at age 23.   - Between ages 17 and 13, Pap tests should be repeated every 2 years.   - Beginning at age 92, you should have a Pap test every 3 years as long as the past 3 Pap tests have been normal.   - Some women have medical problems that increase the chance of getting cervical cancer. Talk to your health care provider about these problems. It is especially important to talk to your health care provider if a new problem develops soon after your last Pap test. In these cases, your health care provider may recommend more frequent screening and Pap tests.   - The above recommendations are the same for women who have or have not gotten the vaccine for human papillomavirus (HPV).   - If you had a hysterectomy for a problem that was not cancer or a condition that could lead to cancer, then you no longer need Pap tests. Even if you no longer need a Pap test, a regular exam is a good idea to make sure no other problems are starting.   - If you are between ages 75 and 65 years, and you have had normal Pap tests going back 10 years, you no longer need Pap tests. Even if you no longer need a Pap test, a regular exam is a good idea to  make sure no other problems are starting.   - If you have had past treatment for cervical cancer or a condition that could lead to cancer, you need Pap tests and screening for cancer for at least 20 years after your treatment.   - If Pap tests have been discontinued, risk factors (such as a new sexual partner) need to be reassessed to determine if screening should be resumed.   - The HPV test is an additional test that may be used for cervical cancer screening. The HPV test looks for the virus that can cause the cell changes on the cervix. The  cells collected during the Pap test can be tested for HPV. The HPV test could be used to screen women aged 30 years and older, and should be used in women of any age who have unclear Pap test results. After the age of 74, women should have HPV testing at the same frequency as a Pap test.   Colorectal cancer can be detected and often prevented. Most routine colorectal cancer screening begins at the age of 65 years and continues through age 84 years. However, your health care provider may recommend screening at an earlier age if you have risk factors for colon cancer. On a yearly basis, your health care provider may provide home test kits to check for hidden blood in the stool.  Use of a small camera at the end of a tube, to directly examine the colon (sigmoidoscopy or colonoscopy), can detect the earliest forms of colorectal cancer. Talk to your health care provider about this at age 67, when routine screening begins. Direct exam of the colon should be repeated every 5 -10 years through age 36 years, unless early forms of pre-cancerous polyps or small growths are found.   People who are at an increased risk for hepatitis B should be screened for this virus. You are considered at high risk for hepatitis B if:  -You were born in a country where hepatitis B occurs often. Talk with your health care provider about which countries are considered high risk.  - Your parents were born in a high-risk country and you have not received a shot to protect against hepatitis B (hepatitis B vaccine).  - You have HIV or AIDS.  - You use needles to inject street drugs.  - You live with, or have sex with, someone who has Hepatitis B.  - You get hemodialysis treatment.  - You take certain medicines for conditions like cancer, organ transplantation, and autoimmune conditions.   Hepatitis C blood testing is recommended for all people born from [birth year] through 1965 and any individual with known risks for hepatitis  C.   Practice safe sex. Use condoms and avoid high-risk sexual practices to reduce the spread of sexually transmitted infections (STIs). STIs include gonorrhea, chlamydia, syphilis, trichomonas, herpes, HPV, and human immunodeficiency virus (HIV). Herpes, HIV, and HPV are viral illnesses that have no cure. They can result in disability, cancer, and death. Sexually active women aged 80 years and younger should be checked for chlamydia. Older women with new or multiple partners should also be tested for chlamydia. Testing for other STIs is recommended if you are sexually active and at increased risk.   Osteoporosis is a disease in which the bones lose minerals and strength with aging. This can result in serious bone fractures or breaks. The risk of osteoporosis can be identified using a bone density scan. Women ages 56 years and over and women at risk  for fractures or osteoporosis should discuss screening with their health care providers. Ask your health care provider whether you should take a calcium supplement or vitamin D to There are also several preventive steps women can take to avoid osteoporosis and resulting fractures or to keep osteoporosis from worsening. -->Recommendations include:  Eat a balanced diet high in fruits, vegetables, calcium, and vitamins.  Get enough calcium. The recommended total intake of is 1,200 mg daily; for best absorption, if taking supplements, divide doses into 250-500 mg doses throughout the day. Of the two types of calcium, calcium carbonate is best absorbed when taken with food but calcium citrate can be taken on an empty stomach.  Get enough vitamin D. NAMS and the [place] recommend at least 1,000 IU per day for women age 39 and over who are at risk of vitamin D deficiency. Vitamin D deficiency can be caused by inadequate sun exposure (for example, those who live in [place]).  Avoid alcohol and smoking. Heavy alcohol intake (more  than 7 drinks per week) increases the risk of falls and hip fracture and women smokers tend to lose bone more rapidly and have lower bone mass than nonsmokers. Stopping smoking is one of the most important changes women can make to improve their health and decrease risk for disease.  Be physically active every day. Weight-bearing exercise (for example, fast walking, hiking, jogging, and weight training) may strengthen bones or slow the rate of bone loss that comes with aging. Balancing and muscle-strengthening exercises can reduce the risk of falling and fracture.  Consider therapeutic medications. Currently, several types of effective drugs are available. Healthcare providers can recommend the type most appropriate for each woman.  Eliminate environmental factors that may contribute to accidents. Falls cause nearly 90% of all osteoporotic fractures, so reducing this risk is an important bone-health strategy. Measures include ample lighting, removing obstructions to walking, using nonskid rugs on floors, and placing mats and/or grab bars in showers.  Be aware of medication side effects. Some common medicines make bones weaker. These include a type of steroid drug called glucocorticoids used for arthritis and asthma, some antiseizure drugs, certain sleeping pills, treatments for endometriosis, and some cancer drugs. An overactive thyroid gland or using too much thyroid hormone for an underactive thyroid can also be a problem. If you are taking these medicines, talk to your doctor about what you can do to help protect your bones.reduce the rate of osteoporosis.    Menopause can be associated with physical symptoms and risks. Hormone replacement therapy is available to decrease symptoms and risks. You should talk to your health care provider about whether hormone replacement therapy is right for you.   Use sunscreen. Apply sunscreen liberally and repeatedly throughout the day. You should seek shade when  your shadow is shorter than you. Protect yourself by wearing long sleeves, pants, a wide-brimmed hat, and sunglasses year round, whenever you are outdoors.   Once a month, do a whole body skin exam, using a mirror to look at the skin on your back. Tell your health care provider of new moles, moles that have irregular borders, moles that are larger than a pencil eraser, or moles that have changed in shape or color.   -Stay current with required vaccines (immunizations).   Influenza vaccine. All adults should be immunized every year.  Tetanus, diphtheria, and acellular pertussis (Td, Tdap) vaccine. Pregnant women should receive 1 dose of Tdap vaccine during each pregnancy. The dose should be obtained  regardless of the length of time since the last dose. Immunization is preferred during the 27th 36th week of gestation. An adult who has not previously received Tdap or who does not know her vaccine status should receive 1 dose of Tdap. This initial dose should be followed by tetanus and diphtheria toxoids (Td) booster doses every 10 years. Adults with an unknown or incomplete history of completing a 3-dose immunization series with Td-containing vaccines should begin or complete a primary immunization series including a Tdap dose. Adults should receive a Td booster every 10 years.  Varicella vaccine. An adult without evidence of immunity to varicella should receive 2 doses or a second dose if she has previously received 1 dose. Pregnant females who do not have evidence of immunity should receive the first dose after pregnancy. This first dose should be obtained before leaving the health care facility. The second dose should be obtained 4 8 weeks after the first dose.  Human papillomavirus (HPV) vaccine. Females aged 58 26 years who have not received the vaccine previously should obtain the 3-dose series. The vaccine is not recommended for use in pregnant females. However, pregnancy testing is not needed  before receiving a dose. If a female is found to be pregnant after receiving a dose, no treatment is needed. In that case, the remaining doses should be delayed until after the pregnancy. Immunization is recommended for any person with an immunocompromised condition through the age of 6 years if she did not get any or all doses earlier. During the 3-dose series, the second dose should be obtained 4 8 weeks after the first dose. The third dose should be obtained 24 weeks after the first dose and 16 weeks after the second dose.  Zoster vaccine. One dose is recommended for adults aged 33 years or older unless certain conditions are present.  Measles, mumps, and rubella (MMR) vaccine. Adults born before [birth year] generally are considered immune to measles and mumps. Adults born in [birth year] or later should have 1 or more doses of MMR vaccine unless there is a contraindication to the vaccine or there is laboratory evidence of immunity to each of the three diseases. A routine second dose of MMR vaccine should be obtained at least 28 days after the first dose for students attending postsecondary schools, health care workers, or international travelers. People who received inactivated measles vaccine or an unknown type of measles vaccine during 1963 1967 should receive 2 doses of MMR vaccine. People who received inactivated mumps vaccine or an unknown type of mumps vaccine before 1979 and are at high risk for mumps infection should consider immunization with 2 doses of MMR vaccine. For females of childbearing age, rubella immunity should be determined. If there is no evidence of immunity, females who are not pregnant should be vaccinated. If there is no evidence of immunity, females who are pregnant should delay immunization until after pregnancy. Unvaccinated health care workers born before [birth year] who lack laboratory evidence of measles, mumps, or rubella immunity or laboratory confirmation of disease should consider measles and  mumps immunization with 2 doses of MMR vaccine or rubella immunization with 1 dose of MMR vaccine.  Pneumococcal 13-valent conjugate (PCV13) vaccine. When indicated, a person who is uncertain of her immunization history and has no record of immunization should receive the PCV13 vaccine. An adult aged 26 years or older who has certain medical conditions and has not been previously immunized should receive 1 dose of PCV13 vaccine. This PCV13 should be followed with  a dose of pneumococcal polysaccharide (PPSV23) vaccine. The PPSV23 vaccine dose should be obtained at least 8 weeks after the dose of PCV13 vaccine. An adult aged 34 years or older who has certain medical conditions and previously received 1 or more doses of PPSV23 vaccine should receive 1 dose of PCV13. The PCV13 vaccine dose should be obtained 1 or more years after the last PPSV23 vaccine dose.  Pneumococcal polysaccharide (PPSV23) vaccine. When PCV13 is also indicated, PCV13 should be obtained first. All adults aged 83 years and older should be immunized. An adult younger than age 59 years who has certain medical conditions should be immunized. Any person who resides in a nursing home or long-term care facility should be immunized. An adult smoker should be immunized. People with an immunocompromised condition and certain other conditions should receive both PCV13 and PPSV23 vaccines. People with human immunodeficiency virus (HIV) infection should be immunized as soon as possible after diagnosis. Immunization during chemotherapy or radiation therapy should be avoided. Routine use of PPSV23 vaccine is not recommended for American Indians, [place] Natives, or people younger than 65 years unless there are medical conditions that require PPSV23 vaccine. When indicated, people who have unknown immunization and have no record of immunization should receive PPSV23 vaccine. One-time revaccination 5 years after the first dose of PPSV23 is recommended for  people aged 101 64 years who have chronic kidney failure, nephrotic syndrome, asplenia, or immunocompromised conditions. People who received 1 2 doses of PPSV23 before age 31 years should receive another dose of PPSV23 vaccine at age 34 years or later if at least 5 years have passed since the previous dose. Doses of PPSV23 are not needed for people immunized with PPSV23 at or after age 63 years.  Meningococcal vaccine. Adults with asplenia or persistent complement component deficiencies should receive 2 doses of quadrivalent meningococcal conjugate (MenACWY-D) vaccine. The doses should be obtained at least 2 months apart. Microbiologists working with certain meningococcal bacteria, [place] recruits, people at risk during an outbreak, and people who travel to or live in countries with a high rate of meningitis should be immunized. A first-year college student up through age 67 years who is living in a residence hall should receive a dose if she did not receive a dose on or after her 16th birthday. Adults who have certain high-risk conditions should receive one or more doses of vaccine.  Hepatitis A vaccine. Adults who wish to be protected from this disease, have certain high-risk conditions, work with hepatitis A-infected animals, work in hepatitis A research labs, or travel to or work in countries with a high rate of hepatitis A should be immunized. Adults who were previously unvaccinated and who anticipate close contact with an international adoptee during the first 60 days after arrival in the [place] from a country with a high rate of hepatitis A should be immunized.  Hepatitis B vaccine.  Adults who wish to be protected from this disease, have certain high-risk conditions, may be exposed to blood or other infectious body fluids, are household contacts or sex partners of hepatitis B positive people, are clients or workers in certain care facilities, or travel to or work in countries with a high rate  of hepatitis B should be immunized.  Haemophilus influenzae type b (Hib) vaccine. A previously unvaccinated person with asplenia or sickle cell disease or having a scheduled splenectomy should receive 1 dose of Hib vaccine. Regardless of previous immunization, a recipient of a hematopoietic stem cell transplant should receive  a 3-dose series 6 12 months after her successful transplant. Hib vaccine is not recommended for adults with HIV infection.   Ages 71 years and over  Blood pressure check.** / Every 1 to 2 years.  Lipid and cholesterol check.** / Every 5 years beginning at age 83 years.  Lung cancer screening. / Every year if you are aged 20 80 years and have a 30-pack-year history of smoking and currently smoke or have quit within the past 15 years. Yearly screening is stopped once you have quit smoking for at least 15 years or develop a health problem that would prevent you from having lung cancer treatment.  Clinical breast exam.** / Every year after age 12 years.  BRCA-related cancer risk assessment.** / For women who have family members with a BRCA-related cancer (breast, ovarian, tubal, or peritoneal cancers).  Mammogram.** / Every year beginning at age 33 years and continuing for as long as you are in good health. Consult with your health care provider.  Pap test.** / Every 3 years starting at age 43 years through age 7 or 10 years with 3 consecutive normal Pap tests. Testing can be stopped between 65 and 70 years with 3 consecutive normal Pap tests and no abnormal Pap or HPV tests in the past 10 years.  HPV screening.** / Every 3 years from ages 63 years through ages 3 or 84 years with a history of 3 consecutive normal Pap tests. Testing can be stopped between 65 and 70 years with 3 consecutive normal Pap tests and no abnormal Pap or HPV tests in the past 10 years.  Fecal occult blood test (FOBT) of stool. / Every year beginning at age 29 years and continuing until age 4 years.  You may not need to do this test if you get a colonoscopy every 10 years.  Flexible sigmoidoscopy or colonoscopy.** / Every 5 years for a flexible sigmoidoscopy or every 10 years for a colonoscopy beginning at age 17 years and continuing until age 4 years.  Hepatitis C blood test.** / For all people born from [birth year] through 1965 and any individual with known risks for hepatitis C.  Osteoporosis screening.** / A one-time screening for women ages 75 years and over and women at risk for fractures or osteoporosis.  Skin self-exam. / Monthly.  Influenza vaccine. / Every year.  Tetanus, diphtheria, and acellular pertussis (Tdap/Td) vaccine.** / 1 dose of Td every 10 years.  Varicella vaccine.** / Consult your health care provider.  Zoster vaccine.** / 1 dose for adults aged 43 years or older.  Pneumococcal 13-valent conjugate (PCV13) vaccine.** / Consult your health care provider.  Pneumococcal polysaccharide (PPSV23) vaccine.** / 1 dose for all adults aged 77 years and older.  Meningococcal vaccine.** / Consult your health care provider.  Hepatitis A vaccine.** / Consult your health care provider.  Hepatitis B vaccine.** / Consult your health care provider.  Haemophilus influenzae type b (Hib) vaccine.** / Consult your health care provider. ** Family history and personal history of risk and conditions may change your health care provider's recommendations. Document Released: 01/02/2002 Document Revised: 08/27/2013  [facility] Patient Information 2014 [place], [place].   EXERCISE AND DIET:  We recommended that you start or continue a regular exercise program for good health. Regular exercise means any activity that makes your heart beat faster and makes you sweat.  We recommend exercising at least 30 minutes per day at least 3 days a week, preferably 5.  We also recommend a diet low in  fat and sugar / carbohydrates.  Inactivity, poor dietary choices and obesity can cause diabetes, heart  attack, stroke, and kidney damage, among others.     ALCOHOL AND SMOKING:  Women should limit their alcohol intake to no more than 7 drinks/beers/glasses of wine (combined, not each!) per week. Moderation of alcohol intake to this level decreases your risk of breast cancer and liver damage.  ( And of course, no recreational drugs are part of a healthy lifestyle.)  Also, you should not be smoking at all or even being exposed to second hand smoke. Most people know smoking can cause cancer, and various heart and lung diseases, but did you know it also contributes to weakening of your bones?  Aging of your skin?  Yellowing of your teeth and nails?   CALCIUM AND VITAMIN D:  Adequate intake of calcium and Vitamin D are recommended.  The recommendations for exact amounts of these supplements seem to change often, but generally speaking 600 mg of calcium (either carbonate or citrate) and 800 units of Vitamin D per day seems prudent. Certain women may benefit from higher intake of Vitamin D.  If you are among these women, your doctor will have told you during your visit.     PAP SMEARS:  Pap smears, to check for cervical cancer or precancers,  have traditionally been done yearly, although recent scientific advances have shown that most women can have pap smears less often.  However, every woman still should have a physical exam from her gynecologist or primary care physician every year. It will include a breast check, inspection of the vulva and vagina to check for abnormal growths or skin changes, a visual exam of the cervix, and then an exam to evaluate the size and shape of the uterus and ovaries.  And after 66 years of age, a rectal exam is indicated to check for rectal cancers. We will also provide age appropriate advice regarding health maintenance, like when you should have certain vaccines, screening for sexually transmitted diseases, bone density testing, colonoscopy, mammograms, etc.    MAMMOGRAMS:   All women over 10 years old should have a yearly mammogram. Many facilities now offer a "3D" mammogram, which may cost around $50 extra out of pocket. If possible,  we recommend you accept the option to have the 3D mammogram performed.  It both reduces the number of women who will be called back for extra views which then turn out to be normal, and it is better than the routine mammogram at detecting truly abnormal areas.     COLONOSCOPY:  Colonoscopy to screen for colon cancer is recommended for all women at age 70.  We know, you hate the idea of the prep.  We agree, BUT, having colon cancer and not knowing it is worse!!  Colon cancer so often starts as a polyp that can be seen and removed at colonscopy, which can quite literally save your life!  And if your first colonoscopy is normal and you have no family history of colon cancer, most women don't have to have it again for 10 years.  Once every ten years, you can do something that may end up saving your life, right?  We will be happy to help you get it scheduled when you are ready.  Be sure to check your insurance coverage so you understand how much it will cost.  It may be covered as a preventative service at no cost, but you should check your particular policy.

## 2017-05-22 ENCOUNTER — Other Ambulatory Visit: Payer: Self-pay

## 2017-05-22 DIAGNOSIS — Z1211 Encounter for screening for malignant neoplasm of colon: Secondary | ICD-10-CM

## 2017-05-25 ENCOUNTER — Telehealth: Payer: Self-pay | Admitting: Family Medicine

## 2017-05-25 NOTE — Telephone Encounter (Signed)
[facility] called and stated Dr. Jenetta Downer had ordered a CT Lung Screen. Wither insurance she will need a PA on this order.

## 2017-05-28 ENCOUNTER — Ambulatory Visit: Payer: Medicare Other

## 2017-05-28 ENCOUNTER — Telehealth: Payer: Self-pay | Admitting: Family Medicine

## 2017-05-28 NOTE — Telephone Encounter (Signed)
CT not approved by [employer].  See other message.

## 2017-05-28 NOTE — Telephone Encounter (Signed)
Baker Janus from [facility] called states Pt's appt today at 1:00 and they need the authorization No# prior to procedure--- Please call as soon as possible [phone].   -glh

## 2017-05-28 NOTE — Telephone Encounter (Signed)
Called [employer] to get CT authorization, does not meet medical necessity. It has been submitted for further review.  CPT [id number].

## 2017-08-27 ENCOUNTER — Ambulatory Visit (INDEPENDENT_AMBULATORY_CARE_PROVIDER_SITE_OTHER): Payer: Medicare Other

## 2017-08-27 DIAGNOSIS — Z23 Encounter for immunization: Secondary | ICD-10-CM | POA: Diagnosis not present

## 2017-09-10 ENCOUNTER — Other Ambulatory Visit: Payer: Self-pay | Admitting: Family Medicine

## 2017-09-16 NOTE — Progress Notes (Signed)
Impression and Recommendations:    1. Other hyperlipidemia   2. Smoking greater than 30 pack years   3. Other specified hypothyroidism   4. Gastroesophageal reflux disease without esophagitis   5. Vitamin D insufficiency   6. Other osteoporosis without current pathological fracture   7. Need for pneumococcal vaccination   8. Need for zoster vaccination   9. Urinary frequency   10. Rosacea   11. Elevated liver enzymes   12. Other fatigue    -Recheck fasting lipid profile and CMP today.  Patient's been off her cholesterol medicines for approximately 5 months we will see where she is at.  In the past she was on Zetia alone which did not bump up her liver enzymes however it had little effect on her.  -She would try to walk for a goal of 30 minutes every day to help with her stress as well as her cholesterol etc.  -For her increased fatigue lately, which is likely related to stress, we will also add on some thyroid labs today.  -Continue vitamin D supplementation which it was in the 40s when last checked approximately 5-6 months ago.  - -Last mammogram was 9\20\17, please call for appointment for your yearly mammogram.  -Please let me know if you change your mind about the lung cancer screening CT chest.  If you would like this test let us know we will put in this order.  -Patient will let me know if she changes her mind about a urology referral for her urinary frequency, every 2 hours at night which is keeping her up.  UA is clear today.  -We will contact you via my chart in the near future regarding the results of your labs. The patient was counseled, risk factors were discussed, anticipatory guidance given.   New Prescriptions   No medications on file     Meds ordered this encounter  Medications  . Zoster Vaccine Adjuvanted [facility]) injection    Sig: Inject 0.5 mLs into the muscle once.    Dispense:  0.5 mL    Refill:  0     Orders Placed This Encounter    Procedures  . Pneumococcal polysaccharide vaccine 23-valent greater than or equal to 2yo subcutaneous/IM  . Comprehensive metabolic panel  . Lipid panel  . T4, free  . TSH  . T3, free  . POCT urinalysis dipstick     Gross side effects, risk and benefits, and alternatives of medications and treatment plan in general discussed with patient.  Patient is aware that all medications have potential side effects and we are unable to predict every side effect or drug-drug interaction that may occur.   Patient will call with any questions prior to using medication if they have concerns.  Expresses verbal understanding and consents to current therapy and treatment regimen.  No barriers to understanding were identified.  Red flag symptoms and signs discussed in detail.  Patient expressed understanding regarding what to do in case of emergency\urgent symptoms  Please see AVS handed out to patient at the end of our visit for further patient instructions/ counseling done pertaining to today's office visit.   Return for f/up 76mo- .    Pt was in the office today for 25+ minutes, with over 50% time spent in face to face counseling of patient's various medical conditions and in coordination of care  Note: This document was prepared using Dragon voice recognition software and may include unintentional dictation errors.   Subjective:  CC:  Chief Complaint  Patient presents with  . Follow-up  . Urinary Frequency    HPI: Lori Montoya is a 66 y.o. female who presents to [place] [facility] today for issues as discussed below.  Patient was last seen a couple of months ago for a Medicare wellness examination.  Per patient we discussed her chest CT screening for lung cancer.  She has a greater than 30-pack-year history but quit in December 2015.  Per patient her insurance would not pay for it.  She declines to go for it again.  She does not want to get the test and understands  risks.   HLD - unable to tol statins due to elevated liver enxymes.   no red meat.  Lots chicken and salmon.    Been off all zetia and statin at least 4mo or so.   More tired than usual but more stres than usual lately.  NO depression or anxiety but just a lot on her plate. Stress mgt--> walks, prays, crossword puzzles.   Eczyma/rosacea- facial- comes and goes over a yr.    Vit D- taking 5 K IU QD.   BP-at home her blood pressure is 115's to 120s over 70s-85 or less.  She checks it frequently and it is never elevated.  She is completely asymptomatic today.   Peeing all the time- sx for months now- no burning, no bld, no pain.  During daytime some and nighttime.  Little amnts and frequently goes.  Goes every 2 hrs during night.  Ever since 6-32mo ago.   Pat aunt with bladder CA- age onset around 68's.    No problems updated.   Wt Readings from Last 3 Encounters:  09/17/17 156 lb 4.8 oz (70.9 kg)  05/16/17 157 lb 11.2 oz (71.5 kg)  02/20/17 160 lb 12.8 oz (72.9 kg)   BP Readings from Last 3 Encounters:  09/17/17 138/76  05/16/17 132/78  02/20/17 123/70   Pulse Readings from Last 3 Encounters:  09/17/17 65  05/16/17 67  02/20/17 73   BMI Readings from Last 3 Encounters:  09/17/17 24.48 kg/m  05/16/17 24.70 kg/m  02/20/17 25.18 kg/m     Patient Care Team    Relationship Specialty Notifications Start End  Mellody Dance, DO PCP - General Family Medicine  09/12/16   Teena Irani, MD (Inactive) Consulting Physician Gastroenterology  09/12/16   Jerrell Belfast, MD Consulting Physician Otolaryngology  09/12/16   Darlin Coco, MD  Cardiology  09/12/16      Patient Active Problem List   Diagnosis Date Noted  . Smoking greater than 30 pack years 09/12/2016    Priority: High  . Hypothyroidism 09/12/2016    Priority: High  . Hyperlipidemia 09/12/2016    Priority: High  . COPD (chronic obstructive pulmonary disease) ? 09/12/2016    Priority: High  . GERD  (gastroesophageal reflux disease) 09/12/2016    Priority: High  . Vitamin D insufficiency 11/03/2016    Priority: Medium  . Osteoporosis 09/12/2016    Priority: Low  . Elevated liver enzymes 09/17/2017  . Other fatigue 02/20/2017  . Myalgia 02/20/2017  . Degenerative disc disease, lumbar 09/12/2016  . Elevated alkaline phosphatase level 09/12/2016  . Rosacea 09/12/2016  . 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 Intermittent palpitations 07/20/2014  . Frequent unifocal PVCs 07/20/2014    Past Medical history, Surgical history, Family history, Social history, Allergies and Medications have been entered into the medical  record, reviewed and changed as needed.    Current Meds  Medication Sig  . Calcium Carbonate ([facility] CALCIUM) 1500 MG TABS Take by mouth.  . Cholecalciferol (VITAMIN D3) 1000 units CAPS Take 5 capsules (5,000 Units total) by mouth daily.  Marland Kitchen levothyroxine (SYNTHROID, LEVOTHROID) 100 MCG tablet TAKE 1 TABLET BY MOUTH DAILY.  . Menaquinone-7 (VITAMIN K2 PO) Take 1 tablet by mouth daily.    Allergies:  Allergies  Allergen Reactions  . Alendronate Other (See Comments)    Bone pain     Review of Systems: General:   Denies fever, chills, unexplained weight loss.  Optho/Auditory:   Denies visual changes, blurred vision/LOV Respiratory:   Denies wheeze, DOE more than baseline levels.  Cardiovascular:   Denies chest pain, palpitations, new onset peripheral edema  Gastrointestinal:   Denies nausea, vomiting, diarrhea, abd pain.  Genitourinary: Denies dysuria, freq/ urgency, flank pain or discharge from genitals.  Endocrine:     Denies hot or cold intolerance, polyuria, polydipsia. Musculoskeletal:   Denies unexplained myalgias, joint swelling, unexplained arthralgias, gait problems.  Skin:  Denies new onset rash, suspicious lesions Neurological:     Denies dizziness, unexplained weakness, numbness  Psychiatric/Behavioral:   Denies mood changes, suicidal or  homicidal ideations, hallucinations    Objective:   Blood pressure 138/76, pulse 65, height 5\' 7"  (1.702 m), weight 156 lb 4.8 oz (70.9 kg). Body mass index is 24.48 kg/m. General:  Well Developed, well nourished, appropriate for stated age.  Neuro:  Alert and oriented,  extra-ocular muscles intact  HEENT:  Normocephalic, atraumatic, neck supple, no carotid bruits appreciated  Skin:  no gross rash, warm, pink. Cardiac:  RRR, S1 S2 Respiratory:  ECTA B/L and A/P, Not using accessory muscles, speaking in full sentences- unlabored. Vascular:  Ext warm, no cyanosis apprec.; cap RF less 2 sec. Psych:  No HI/SI, judgement and insight good, Euthymic mood. Full Affect.

## 2017-09-17 ENCOUNTER — Encounter: Payer: Self-pay | Admitting: Family Medicine

## 2017-09-17 ENCOUNTER — Ambulatory Visit (INDEPENDENT_AMBULATORY_CARE_PROVIDER_SITE_OTHER): Payer: Medicare Other | Admitting: Family Medicine

## 2017-09-17 VITALS — BP 138/76 | HR 65 | Ht 67.0 in | Wt 156.3 lb

## 2017-09-17 DIAGNOSIS — F17210 Nicotine dependence, cigarettes, uncomplicated: Secondary | ICD-10-CM

## 2017-09-17 DIAGNOSIS — R350 Frequency of micturition: Secondary | ICD-10-CM | POA: Diagnosis not present

## 2017-09-17 DIAGNOSIS — E7849 Other hyperlipidemia: Secondary | ICD-10-CM | POA: Diagnosis not present

## 2017-09-17 DIAGNOSIS — R5383 Other fatigue: Secondary | ICD-10-CM | POA: Diagnosis not present

## 2017-09-17 DIAGNOSIS — E559 Vitamin D deficiency, unspecified: Secondary | ICD-10-CM | POA: Diagnosis not present

## 2017-09-17 DIAGNOSIS — K219 Gastro-esophageal reflux disease without esophagitis: Secondary | ICD-10-CM | POA: Diagnosis not present

## 2017-09-17 DIAGNOSIS — M818 Other osteoporosis without current pathological fracture: Secondary | ICD-10-CM

## 2017-09-17 DIAGNOSIS — Z23 Encounter for immunization: Secondary | ICD-10-CM | POA: Diagnosis not present

## 2017-09-17 DIAGNOSIS — R748 Abnormal levels of other serum enzymes: Secondary | ICD-10-CM | POA: Diagnosis not present

## 2017-09-17 DIAGNOSIS — E038 Other specified hypothyroidism: Secondary | ICD-10-CM

## 2017-09-17 DIAGNOSIS — L719 Rosacea, unspecified: Secondary | ICD-10-CM

## 2017-09-17 LAB — POCT URINALYSIS DIPSTICK
Bilirubin, UA: NEGATIVE
Glucose, UA: NEGATIVE
Ketones, UA: NEGATIVE
Leukocytes, UA: NEGATIVE
NITRITE UA: NEGATIVE
PH UA: 7.5 (ref 5.0–8.0)
PROTEIN UA: NEGATIVE
RBC UA: NEGATIVE
Spec Grav, UA: 1.015 (ref 1.010–1.025)
UROBILINOGEN UA: 0.2 U/dL

## 2017-09-17 MED ORDER — ZOSTER VAC RECOMB ADJUVANTED 50 MCG/0.5ML IM SUSR
0.5000 mL | Freq: Once | INTRAMUSCULAR | 0 refills | Status: AC
Start: 2017-09-17 — End: 2017-09-17

## 2017-09-17 NOTE — Patient Instructions (Addendum)
-Last mammogram was 9\20\17, please call for appointment for your yearly mammogram.  -Please let me know if you change your mind about the lung cancer screening CT chest.  If you would like this test let us know we will put in this order.  -Patient will let me know if she changes her mind about a urology referral for her urinary frequency, every 2 hours at night which is keeping her up.  UA is clear today.  -We will contact you via my chart in the near future regarding the results of your labs.   Please realize, EXERCISE IS MEDICINE!  -  American Heart Association [facility]) guidelines for exercise : If you are in good health, without any medical conditions, you should engage in 150 minutes of moderate intensity aerobic activity per week.  This means you should be huffing and puffing throughout your workout.   Engaging in regular exercise will improve brain function and memory, as well as improve mood, boost immune system and help with weight management.  As well as the other, more well-known effects of exercise such as decreasing blood sugar levels, decreasing blood pressure,  and decreasing bad cholesterol levels/ increasing good cholesterol levels.     -  The AHA strongly endorses consumption of a diet that contains a variety of foods from all the food categories with an emphasis on fruits and vegetables; fat-free and low-fat dairy products; cereal and grain products; legumes and nuts; and fish, poultry, and/or extra lean meats.    Excessive food intake, especially of foods high in saturated and trans fats, sugar, and salt, should be avoided.    Adequate water intake of roughly 1/2 of your weight in pounds, should equal the ounces of water per day you should drink.  So for instance, if you're 200 pounds, that would be 100 ounces of water per day.         Mediterranean Diet  Why follow it? Research shows. . Those who follow the Mediterranean diet have a reduced risk of heart disease  . The diet  is associated with a reduced incidence of Parkinson's and Alzheimer's diseases . People following the diet may have longer life expectancies and lower rates of chronic diseases  . The Dietary Guidelines for Americans recommends the Mediterranean diet as an eating plan to promote health and prevent disease  What Is the Mediterranean Diet?  . Healthy eating plan based on typical foods and recipes of Mediterranean-style cooking . The diet is primarily a plant based diet; these foods should make up a majority of meals   Starches - Plant based foods should make up a majority of meals - They are an important sources of vitamins, minerals, energy, antioxidants, and fiber - Choose whole grains, foods high in fiber and minimally processed items  - Typical grain sources include wheat, oats, barley, corn, brown rice, bulgar, farro, millet, polenta, couscous  - Various types of beans include chickpeas, lentils, fava beans, black beans, white beans   Fruits  Veggies - Large quantities of antioxidant rich fruits & veggies; 6 or more servings  - Vegetables can be eaten raw or lightly drizzled with oil and cooked  - Vegetables common to the traditional Mediterranean Diet include: artichokes, arugula, beets, broccoli, brussel sprouts, cabbage, carrots, celery, collard greens, cucumbers, eggplant, kale, leeks, lemons, lettuce, mushrooms, okra, onions, peas, peppers, potatoes, pumpkin, radishes, rutabaga, shallots, spinach, sweet potatoes, turnips, zucchini - Fruits common to the Mediterranean Diet include: apples, apricots, avocados, cherries, clementines, dates,  figs, grapefruits, grapes, melons, nectarines, oranges, peaches, pears, pomegranates, strawberries, tangerines  Fats - Replace butter and margarine with healthy oils, such as olive oil, canola oil, and tahini  - Limit nuts to no more than a handful a day  - Nuts include walnuts, almonds, pecans, pistachios, pine nuts  - Limit or avoid candied, honey  roasted or heavily salted nuts - Olives are central to the Mediterranean diet - can be eaten whole or used in a variety of dishes   Meats Protein - Limiting red meat: no more than a few times a month - When eating red meat: choose lean cuts and keep the portion to the size of deck of cards - Eggs: approx. 0 to 4 times a week  - Fish and lean poultry: at least 2 a week  - Healthy protein sources include, chicken, [place], lean beef, lamb - Increase intake of seafood such as tuna, salmon, trout, mackerel, shrimp, scallops - Avoid or limit high fat processed meats such as sausage and bacon  Dairy - Include moderate amounts of low fat dairy products  - Focus on healthy dairy such as fat free yogurt, skim milk, low or reduced fat cheese - Limit dairy products higher in fat such as whole or 2% milk, cheese, ice cream  Alcohol - Moderate amounts of red wine is ok  - No more than 5 oz daily for women (all ages) and men older than age 40  - No more than 10 oz of wine daily for men younger than 7  Other - Limit sweets and other desserts  - Use herbs and spices instead of salt to flavor foods  - Herbs and spices common to the traditional Mediterranean Diet include: basil, bay leaves, chives, cloves, cumin, fennel, garlic, lavender, marjoram, mint, oregano, parsley, pepper, rosemary, sage, savory, sumac, tarragon, thyme   It's not just a diet, it's a lifestyle:  . The Mediterranean diet includes lifestyle factors typical of those in the region  . Foods, drinks and meals are best eaten with others and savored . Daily physical activity is important for overall good health . This could be strenuous exercise like running and aerobics . This could also be more leisurely activities such as walking, housework, yard-work, or taking the stairs . Moderation is the key; a balanced and healthy diet accommodates most foods and drinks . Consider portion sizes and frequency of consumption of certain foods   Meal  Ideas & Options:  . Breakfast:  o Whole wheat toast or whole wheat English muffins with peanut butter & hard boiled egg o Steel cut oats topped with apples & cinnamon and skim milk  o Fresh fruit: banana, strawberries, melon, berries, peaches  o Smoothies: strawberries, bananas, greek yogurt, peanut butter o Low fat greek yogurt with blueberries and granola  o Egg white omelet with spinach and mushrooms o Breakfast couscous: whole wheat couscous, apricots, skim milk, cranberries  . Sandwiches:  o Hummus and grilled vegetables (peppers, zucchini, squash) on whole wheat bread   o Grilled chicken on whole wheat pita with lettuce, tomatoes, cucumbers or tzatziki  o Tuna salad on whole wheat bread: tuna salad made with greek yogurt, olives, red peppers, capers, green onions o Garlic rosemary lamb pita: lamb sauted with garlic, rosemary, salt & pepper; add lettuce, cucumber, greek yogurt to pita - flavor with lemon juice and black pepper  . Seafood:  o Mediterranean grilled salmon, seasoned with garlic, basil, parsley, lemon juice and black pepper o Shrimp,  lemon, and spinach whole-grain pasta salad made with low fat greek yogurt  o Seared scallops with lemon orzo  o Seared tuna steaks seasoned salt, pepper, coriander topped with tomato mixture of olives, tomatoes, olive oil, minced garlic, parsley, green onions and cappers  . Meats:  o Herbed greek chicken salad with kalamata olives, cucumber, feta  o Red bell peppers stuffed with spinach, bulgur, lean ground beef (or lentils) & topped with feta   o Kebabs: skewers of chicken, tomatoes, onions, zucchini, squash  o [place] burgers: made with red onions, mint, dill, lemon juice, feta cheese topped with roasted red peppers . Vegetarian o Cucumber salad: cucumbers, artichoke hearts, celery, red onion, feta cheese, tossed in olive oil & lemon juice  o Hummus and whole grain pita points with a greek salad (lettuce, tomato, feta, olives, cucumbers, red  onion) o Lentil soup with celery, carrots made with vegetable broth, garlic, salt and pepper  o Tabouli salad: parsley, bulgur, mint, scallions, cucumbers, tomato, radishes, lemon juice, olive oil, salt and pepper.

## 2017-09-18 LAB — LIPID PANEL
Chol/HDL Ratio: 5.3 ratio — ABNORMAL HIGH (ref 0.0–4.4)
Cholesterol, Total: 265 mg/dL — ABNORMAL HIGH (ref 100–199)
HDL: 50 mg/dL (ref >39)
LDL Calculated: 190 mg/dL — ABNORMAL HIGH (ref 0–99)
Triglycerides: 127 mg/dL (ref 0–149)
VLDL CHOLESTEROL CAL: 25 mg/dL (ref 5–40)

## 2017-09-18 LAB — COMPREHENSIVE METABOLIC PANEL
A/G RATIO: 1.6 (ref 1.2–2.2)
ALBUMIN: 4.5 g/dL (ref 3.6–4.8)
ALT: 28 IU/L (ref 0–32)
AST: 40 IU/L (ref 0–40)
Alkaline Phosphatase: 161 IU/L — ABNORMAL HIGH (ref 39–117)
BILIRUBIN TOTAL: 0.4 mg/dL (ref 0.0–1.2)
BUN / CREAT RATIO: 11 — AB (ref 12–28)
BUN: 9 mg/dL (ref 8–27)
CHLORIDE: 102 mmol/L (ref 96–106)
CO2: 24 mmol/L (ref 20–29)
Calcium: 9.8 mg/dL (ref 8.7–10.3)
Creatinine, Ser: 0.79 mg/dL (ref 0.57–1.00)
GFR calc non Af Amer: 78 mL/min/{1.73_m2} (ref >59)
GFR, EST AFRICAN AMERICAN: 90 mL/min/{1.73_m2} (ref >59)
Globulin, Total: 2.8 g/dL (ref 1.5–4.5)
Glucose: 87 mg/dL (ref 65–99)
Potassium: 5 mmol/L (ref 3.5–5.2)
Sodium: 139 mmol/L (ref 134–144)
TOTAL PROTEIN: 7.3 g/dL (ref 6.0–8.5)

## 2017-09-18 LAB — T4, FREE: FREE T4: 1.49 ng/dL (ref 0.82–1.77)

## 2017-09-18 LAB — T3, FREE: T3, Free: 2.5 pg/mL (ref 2.0–4.4)

## 2017-09-18 LAB — TSH: TSH: 3.61 u[IU]/mL (ref 0.450–4.500)

## 2017-09-19 ENCOUNTER — Other Ambulatory Visit: Payer: Self-pay | Admitting: Family Medicine

## 2017-09-19 DIAGNOSIS — Z1231 Encounter for screening mammogram for malignant neoplasm of breast: Secondary | ICD-10-CM

## 2017-10-16 ENCOUNTER — Ambulatory Visit: Payer: Medicare Other

## 2017-11-23 ENCOUNTER — Ambulatory Visit
Admission: RE | Admit: 2017-11-23 | Discharge: 2017-11-23 | Disposition: A | Discharge status: Home / Self Care | Payer: Medicare Other | Source: Ambulatory Visit | Attending: Family Medicine | Admitting: Family Medicine

## 2017-11-23 DIAGNOSIS — Z1231 Encounter for screening mammogram for malignant neoplasm of breast: Secondary | ICD-10-CM

## 2017-11-26 ENCOUNTER — Other Ambulatory Visit: Payer: Self-pay | Admitting: Family Medicine

## 2017-11-26 DIAGNOSIS — R928 Other abnormal and inconclusive findings on diagnostic imaging of breast: Secondary | ICD-10-CM

## 2017-11-28 ENCOUNTER — Other Ambulatory Visit: Payer: Medicare Other

## 2017-12-12 ENCOUNTER — Other Ambulatory Visit: Payer: Self-pay | Admitting: Family Medicine

## 2017-12-12 ENCOUNTER — Ambulatory Visit
Admission: RE | Admit: 2017-12-12 | Discharge: 2017-12-12 | Disposition: A | Discharge status: Home / Self Care | Payer: Medicare Other | Source: Ambulatory Visit | Attending: Family Medicine | Admitting: Family Medicine

## 2017-12-12 DIAGNOSIS — R928 Other abnormal and inconclusive findings on diagnostic imaging of breast: Secondary | ICD-10-CM

## 2017-12-12 DIAGNOSIS — N6489 Other specified disorders of breast: Secondary | ICD-10-CM

## 2017-12-12 DIAGNOSIS — R922 Inconclusive mammogram: Secondary | ICD-10-CM | POA: Diagnosis not present

## 2017-12-12 DIAGNOSIS — N651 Disproportion of reconstructed breast: Secondary | ICD-10-CM | POA: Diagnosis not present

## 2017-12-17 ENCOUNTER — Ambulatory Visit
Admission: RE | Admit: 2017-12-17 | Discharge: 2017-12-17 | Disposition: A | Discharge status: Home / Self Care | Payer: Medicare Other | Source: Ambulatory Visit | Attending: Family Medicine | Admitting: Family Medicine

## 2017-12-17 ENCOUNTER — Other Ambulatory Visit: Payer: Self-pay | Admitting: Family Medicine

## 2017-12-17 DIAGNOSIS — N6489 Other specified disorders of breast: Secondary | ICD-10-CM | POA: Diagnosis not present

## 2017-12-18 ENCOUNTER — Telehealth: Payer: Self-pay | Admitting: Family Medicine

## 2017-12-18 NOTE — Telephone Encounter (Signed)
Noted and Dr. Raliegh Scarlet was notified. MPulliam, CMA/RT(R)

## 2017-12-18 NOTE — Telephone Encounter (Signed)
Patient's mass declared non cancereous--Wants Dr. Raliegh Scarlet to know per Santiago Glad. --glh

## 2018-01-03 ENCOUNTER — Ambulatory Visit: Payer: Self-pay | Admitting: Surgery

## 2018-01-03 DIAGNOSIS — N6310 Unspecified lump in the right breast, unspecified quadrant: Secondary | ICD-10-CM

## 2018-01-03 DIAGNOSIS — N6482 Hypoplasia of breast: Secondary | ICD-10-CM | POA: Diagnosis not present

## 2018-01-03 NOTE — H&P (Signed)
Lori Montoya Documented: 01/03/2018 10:42 AM Location: [facility] Patient #: [id number] DOB: 23-Sep-1951 Widowed / Language: [name] / Race: White Female  History of Present Illness Lori Montoya A. Lori Hafley MD; 01/03/2018 11:58 AM) Patient words: Patient sent at the request of the [facility] for a right breast mammographic abnormality. In her right medial breast is an area of architectural distortion. Core biopsy was done which shows complex sclerosing lesion. Patient denies any history of breast pain nipple discharge or other problem with either breasts. She does note her left breast as well as been much larger than right. She's had no previous biopsies or surgeries.                      Possible distortion in the outer right breast anteriorly on a recent screening mammogram. EXAM: 2D DIGITAL DIAGNOSTIC RIGHT MAMMOGRAM WITH ADJUNCT TOMO ULTRASOUND RIGHT BREAST COMPARISON: Previous exam(s). ACR Breast Density Category c: The breast tissue is heterogeneously dense, which may obscure small masses. FINDINGS: 2D and 3D spot compression views of the right breast confirm an area of architectural distortion in the outer portion of the breast slightly inferiorly. On physical exam, no mass is palpable in the outer right breast or right axilla. Targeted ultrasound is performed, showing normal appearing breast tissue in the outer right breast. Also demonstrated are normal appearing right axillary lymph nodes. There is one lymph node with questionable eccentric cortical thickening superiorly. At real time, this appeared to be artifactual due to the configuration of the cortex. IMPRESSION: 1. Area of architectural distortion in the lower outer quadrant of the right breast with no sonographic correlate. 2. Right axillary lymph node with borderline eccentric cortical thickening. This most likely represents a normal lymph node. This will not require biopsy unless  biopsy of the architectural distortion in the lower outer right breast demonstrates malignancy. RECOMMENDATION: Stereotactic guided core needle biopsy of the area of architectural distortion in the lower outer quadrant of the right breast. This has been discussed with the patient and scheduled at 1:45 p.m. on 12/17/2017. I have discussed the findings and recommendations with the patient. Results were also provided in writing at the conclusion of the visit. If applicable, a reminder letter will be sent to the patient regarding the next appointment. BI-RADS CATEGORY 4: Suspicious. Electronically Signed By: Claudie Revering M.D. On: 12/12/2017 14:28                    Diagnosis Breast, right, needle core biopsy, LOQ - COMPLEX SCLEROSING LESION (CSL). SEE NOTE - NEGATIVE FOR CARCINOMA.  The patient is a 67 year old female.   Past Surgical History Lori Montoya, [place]; 01/03/2018 10:42 AM) Breast Biopsy Right. Gallbladder Surgery - Laparoscopic Oral Surgery  Diagnostic Studies History Lori Montoya, [place]; 01/03/2018 10:42 AM) Colonoscopy 1-5 years ago Mammogram within last year Pap Smear 1-5 years ago  Allergies Lori Montoya, RMA; 01/03/2018 10:43 AM) No Known Drug Allergies [01/03/2018]: Allergies Reconciled  Medication History Lori Montoya, [place]; 01/03/2018 10:46 AM) Levothyroxine Sodium (100MCG Tablet, Oral) Active. Synthroid (50MCG Tablet, Oral) Active. Vitamin D (1000UNIT Tablet, Oral) Active. Calcium Carbonate (1250 (500 Ca)MG Tablet, Oral) Active. Medications Reconciled  Social History Lori Montoya, [place]; 01/03/2018 10:42 AM) Alcohol use Remotely quit alcohol use. Caffeine use Carbonated beverages, Coffee, Tea. No drug use Tobacco use Former smoker.  Family History Lori Montoya, [place]; 01/03/2018 10:42 AM) Alcohol Abuse Brother, Father, Sister. Cancer Father. Cerebrovascular Accident Mother. Colon Polyps Father,  Sister. Depression  Sister. Migraine Headache Sister. Thyroid problems Mother.  Pregnancy / Birth History Lori Montoya, [place]; 01/03/2018 10:42 AM) Age at menarche 31 years. Age of menopause 11-50 Gravida 2 Length (months) of breastfeeding 3-6 Maternal age 51-20 Para 1  Other Problems Lori Montoya, [place]; 01/03/2018 10:42 AM) Back Pain Cholelithiasis Chronic Obstructive Lung Disease Gastroesophageal Reflux Disease Hemorrhoids Hypercholesterolemia Lump In Breast Other disease, cancer, significant illness Seizure Disorder Thyroid Disease     Review of Systems Lori Montoya RMA; 01/03/2018 10:42 AM) General Not Present- Appetite Loss, Chills, Fatigue, Fever, Night Sweats, Weight Gain and Weight Loss. Skin Not Present- Change in Wart/Mole, Dryness, Hives, Jaundice, New Lesions, Non-Healing Wounds, Rash and Ulcer. HEENT Not Present- Earache, Hearing Loss, Hoarseness, Nose Bleed, Oral Ulcers, Ringing in the Ears, Seasonal Allergies, Sinus Pain, Sore Throat, Visual Disturbances, Wears glasses/contact lenses and Yellow Eyes. Respiratory Present- Snoring. Not Present- Bloody sputum, Chronic Cough, Difficulty Breathing and Wheezing. Breast Present- Breast Mass. Not Present- Breast Pain, Nipple Discharge and Skin Changes. Cardiovascular Not Present- Chest Pain, Difficulty Breathing Lying Down, Leg Cramps, Palpitations, Rapid Heart Rate, Shortness of Breath and Swelling of Extremities. Gastrointestinal Not Present- Abdominal Pain, Bloating, Bloody Stool, Change in Bowel Habits, Chronic diarrhea, Constipation, Difficulty Swallowing, Excessive gas, Gets full quickly at meals, Hemorrhoids, Indigestion, Nausea, Rectal Pain and Vomiting. Female Genitourinary Not Present- Frequency, Nocturia, Painful Urination, Pelvic Pain and Urgency. Musculoskeletal Present- Back Pain. Not Present- Joint Pain, Joint Stiffness, Muscle Pain, Muscle Weakness and Swelling of  Extremities. Neurological Not Present- Decreased Memory, Fainting, Headaches, Numbness, Seizures, Tingling, Tremor, Trouble walking and Weakness. Psychiatric Not Present- Anxiety, Bipolar, Change in Sleep Pattern, Depression, Fearful and Frequent crying. Endocrine Not Present- Cold Intolerance, Excessive Hunger, Hair Changes, Heat Intolerance, Hot flashes and New Diabetes. Hematology Not Present- Blood Thinners, Easy Bruising, Excessive bleeding, Gland problems, HIV and Persistent Infections.  Vitals Mardene Celeste King RMA; 01/03/2018 10:43 AM) 01/03/2018 10:43 AM Weight: 153.6 lb Height: 67in Body Surface Area: 1.81 m Body Mass Index: 24.06 kg/m  Temp.: 97.24F  Pulse: 84 (Regular)  BP: 155/85 (Sitting, Left Arm, Standard)      Physical Exam (Lavin Petteway A. Len Kluver MD; 01/03/2018 12:03 PM)  General Mental Status-Alert. General Appearance-Consistent with stated age. Hydration-Well hydrated. Voice-Normal.  Head and Neck Head-normocephalic, atraumatic with no lesions or palpable masses. Trachea-midline. Thyroid Gland Characteristics - normal size and consistency.  Eye Eyeball - Bilateral-Extraocular movements intact. Sclera/Conjunctiva - Bilateral-No scleral icterus.  Breast Note: Left breast twice the size of right. No masses in either breast. No evidence of bruising from the biopsy. No discharge noted bilaterally.  Cardiovascular Cardiovascular examination reveals -normal heart sounds, regular rate and rhythm with no murmurs and normal pedal pulses bilaterally.  Neurologic Neurologic evaluation reveals -alert and oriented x 3 with no impairment of recent or remote memory. Mental Status-Normal.  Musculoskeletal Normal Exam - Left-Upper Extremity Strength Normal and Lower Extremity Strength Normal. Normal Exam - Right-Upper Extremity Strength Normal and Lower Extremity Strength Normal.  Lymphatic Head & Neck  General Head & Neck  Lymphatics: Bilateral - Description - Normal. Axillary  General Axillary Region: Bilateral - Description - Normal. Tenderness - Non Tender.    Assessment & Plan (Chandni Gagan A. Shalanda Brogden MD; 01/03/2018 12:04 PM)  BREAST MASS, RIGHT (N63.10) Impression: Discussed small risk of malignancy in this setting. Recommend right breast C localized lumpectomy. She has significant asymmetry may benefit from contralateral left breast reduction and/or lift. She will have significant more asymmetry with lumpectomy. Discussed observation as well. Refer to plastic surgery for  consultation about possible left breast reduction versus lift. Risk of lumpectomy include bleeding, infection, seroma, more surgery, use of seed/wire, wound care, cosmetic deformity and the need for other treatments, death , blood clots, death. Pt agrees to proceed.  Current Plans Pt Education - [facility] Breast Biopsy HCI: discussed with patient and provided information. You are being scheduled for surgery- Our schedulers will call you.  You should hear from our office's scheduling department within 5 working days about the location, date, and time of surgery. We try to make accommodations for patient's preferences in scheduling surgery, but sometimes the OR schedule or the surgeon's schedule prevents [place] from making those accommodations.  If you have not heard from our office [phone]) in 5 working days, call the office and ask for your surgeon's nurse.  If you have other questions about your diagnosis, plan, or surgery, call the office and ask for your surgeon's nurse.   [facility] [phone]) Impression: right needs to see plastics

## 2018-01-14 ENCOUNTER — Ambulatory Visit: Payer: Medicare Other | Admitting: Family Medicine

## 2018-01-14 ENCOUNTER — Encounter: Payer: Self-pay | Admitting: Family Medicine

## 2018-01-14 VITALS — BP 136/79 | HR 71 | Ht 67.0 in | Wt 152.5 lb

## 2018-01-14 DIAGNOSIS — E781 Pure hyperglyceridemia: Secondary | ICD-10-CM | POA: Diagnosis not present

## 2018-01-14 DIAGNOSIS — H6980 Other specified disorders of Eustachian tube, unspecified ear: Secondary | ICD-10-CM | POA: Diagnosis not present

## 2018-01-14 DIAGNOSIS — E559 Vitamin D deficiency, unspecified: Secondary | ICD-10-CM | POA: Diagnosis not present

## 2018-01-14 DIAGNOSIS — E7800 Pure hypercholesterolemia, unspecified: Secondary | ICD-10-CM | POA: Diagnosis not present

## 2018-01-14 DIAGNOSIS — F17210 Nicotine dependence, cigarettes, uncomplicated: Secondary | ICD-10-CM

## 2018-01-14 DIAGNOSIS — E038 Other specified hypothyroidism: Secondary | ICD-10-CM

## 2018-01-14 MED ORDER — FLUTICASONE PROPIONATE 50 MCG/ACT NA SUSP: NASAL | 2 refills | Status: DC

## 2018-01-14 NOTE — Patient Instructions (Addendum)
Guidelines for a Low Cholesterol, Low Saturated Fat Diet   Fats - Limit total intake of fats and oils. - Avoid butter, stick margarine, shortening, lard, palm and coconut oils. - Limit mayonnaise, salad dressings, gravies and sauces, unless they are homemade with low-fat ingredients. - Limit chocolate. - Choose low-fat and nonfat products, such as low-fat mayonnaise, low-fat or non-hydrogenated peanut butter, low-fat or fat-free salad dressings and nonfat gravy. - Use vegetable oil, such as canola or olive oil. - Look for margarine that does not contain trans fatty acids. - Use nuts in moderate amounts. - Read ingredient labels carefully to determine both amount and type of fat present in foods. Limit saturated and trans fats! - Avoid high-fat processed and convenience foods.  Meats and Meat Alternatives - Choose fish, chicken, turkey and lean meats. - Use dried beans, peas, lentils and tofu. - Limit egg yolks to three to four per week. - If you eat red meat, limit to no more than three servings per week and choose loin or round cuts. - Avoid fatty meats, such as bacon, sausage, franks, luncheon meats and ribs. - Avoid all organ meats, including liver.  Dairy - Choose nonfat or low-fat milk, yogurt and cottage cheese. - Most cheeses are high in fat. Choose cheeses made from non-fat milk, such as mozzarella and ricotta cheese. - Choose light or fat-free cream cheese and sour cream. - Avoid cream and sauces made with cream.  Fruits and Vegetables - Eat a wide variety of fruits and vegetables. - Use lemon juice, vinegar or "mist" olive oil on vegetables. - Avoid adding sauces, fat or oil to vegetables.  Breads, Cereals and Grains - Choose whole-grain breads, cereals, pastas and rice. - Avoid high-fat snack foods, such as granola, cookies, pies, pastries, doughnuts and croissants.  Cooking Tips - Avoid deep fried foods. - Trim visible fat off meats and remove skin from poultry  before cooking. - Bake, broil, boil, poach or roast poultry, fish and lean meats. - Drain and discard fat that drains out of meat as you cook it. - Add little or no fat to foods. - Use vegetable oil sprays to grease pans for cooking or baking. - Steam vegetables. - Use herbs or no-oil marinades to flavor foods.       The quick and dirty--> lower triglyceride levels more through...  1) - Beware of bad fats: Cutting back on saturated fat (in red meat and full-fat dairy foods) and trans fats (in restaurant fried foods and commercially prepared baked goods) can lower triglycerides.  2) - Go for good carbs: Easily digested carbohydrates (such as white bread, white rice, cornflakes, and sugary sodas) give triglycerides a definite boost.   3) - Eating whole grains and cutting back on soda can help control triglycerides.  4) - Check your alcohol use. In some people, alcohol dramatically boosts triglycerides. The only way to know if this is true for you is to avoid alcohol for a few weeks and have your triglycerides tested again.  5) - Go fish. Omega-3 fats in salmon, tuna, sardines, and other fatty fish can lower triglycerides. Having fish twice a week is fine.  6) - Aim for a healthy weight. If you are overweight, losing just 5% to 10% of your weight can help drive down triglycerides.  7) - Get moving. Exercise lowers triglycerides and boosts heart-healthy HDL cholesterol.  8) - quit smoking if you do  --> for more information, see below; or go to  www.heart.org    and do a search for desired topics   For those diagnosed with high triglycerides, it's important to take action to lower your levels and improve your heart health.  Triglyceride is just a fancy word for fat -- the fat in our bodies is stored in the form of triglycerides. Triglycerides are found in foods and manufactured in our bodies.  Normal triglyceride levels are defined as less than 150 mg/dL; 150 to 199 is considered  borderline high; 200 to 499 is high; and 500 or higher is officially called very high. To me, anything over 150 is a red flag indicating my patient needs to take immediate steps to get the situation under control.   What is the significance of high triglycerides? High triglyceride levels make blood thicker and stickier, which means that it is more likely to form clots. Studies have shown that triglyceride levels are associated with increased risks of cardiovascular disease and stroke -- in both men and women -- alone or in combination with other risk factors (high triglycerides combined with high LDL cholesterol can be a particularly deadly combination). For example, in one ground-breaking study, high triglycerides alone increased the risk of cardiovascular disease by 14 percent in men, and by 37 percent in women. But when the test subjects also had low HDL cholesterol (that's the good cholesterol) and other risk factors, high triglycerides increased the risk of disease by 32 percent in men and 76 percent in women.   Fortunately, triglycerides can sometimes be controlled with several diet and lifestyle changes.    What Factors Can Increase Triglycerides? As with cholesterol, eating too much of the wrong kinds of fats will raise your blood triglycerides.  Therefore, it's important to restrict the amounts of saturated fats and trans fats you allow into your diet.  Triglyceride levels can also shoot up after eating foods that are high in carbohydrates or after drinking alcohol.  That's why triglyceride blood tests require an overnight fast.  If you have elevated triglycerides, it's especially important to avoid sugary and refined carbohydrates, including sugar, honey, and other sweeteners, soda and other sugary drinks, candy, baked goods, and anything made with white (refined or enriched) flour, including white bread, rolls, cereals, buns, pastries, regular pasta, and white rice.  You'll also want to limit  dried fruit and fruit juice since they're dense in simple sugar.  All of these low-quality carbs cause a sudden rise in insulin, which may lead to a spike in triglycerides.  Triglycerides can also become elevated as a reaction to having diabetes, hypothyroidism, or kidney disease. As with most other heart-related factors, being overweight and inactive also contribute to abnormal triglycerides. And unfortunately, some people have a genetic predisposition that causes them to manufacture way too much triglycerides on their own, no matter how carefully they eat.     How Can You Lower Your Triglyceride Levels? If you are diagnosed with high triglycerides, it's important to take action. There are several things you can do to help lower your triglyceride levels and improve your heart health:  --> Lose weight if you are overweight.  There is a clear correlation between obesity and high triglycerides -- the heavier people are, the higher their triglyceride levels are likely to be. The good news is that losing weight can significantly lower triglycerides. In a large study of individuals with type 2 diabetes, those assigned to the "lifestyle intervention group" -- which involved counseling, a low-calorie meal plan, and customized exercise program -- lost 8.6% of their   body weight and lowered their triglyceride levels by more than 16%. If you're overweight, find a weight loss plan that works for you and commit to shedding the pounds and getting healthier.  --> Reduce the amount of saturated fat and trans fat in your diet.  Start by avoiding or dramatically limiting butter, cream cheese, lard, sour cream, doughnuts, cakes, cookies, candy bars, regular ice cream, fried foods, pizza, cheese sauce, cream-based sauces and salad dressings, high-fat meats (including fatty hamburgers, bologna, pepperoni, sausage, bacon, salami, pastrami, spareribs, and hot dogs), high-fat cuts of beef and pork, and whole-milk dairy  products.   Other ways to cut back: Choose lean meats only (including skinless chicken and turkey, lean beef, lean pork), fish, and reduced-fat or fat-free dairy products.   Experiment with adding whole soy foods to your diet. Although soy itself may not reduce risk of heart disease, it replaces hazardous animal fats with healthier proteins. Choose high-quality soy foods, such as tofu, tempeh, soy milk, and edamame (whole soybeans).  Always remove skin from poultry.  Prepare foods by baking, roasting, broiling, boiling, poaching, steaming, grilling, or stir-frying in vegetable oil.  Most stick margarines contain trans fats, and trans fats are also found in some packaged baked goods, potato chips, snack foods, fried foods, and fast food that use or create hydrogenated oils.    (All food labels must now list the amount of trans fats, right after the amount of saturated fats -- good news for consumers. As a result, many food companies have now reformulated their products to be trans fat free.many, but not all! So it's still just as important to read labels and make sure the packaged foods you buy don't contain trans fats.)     If you use margarine, purchase soft-tub margarine spreads that contain 0 grams trans fats and don't list any partially hydrogenated oils in the ingredients list. By substituting olive oil or vegetable oil for trans fats in just 2 percent of your daily calories, you can reduce your risk of heart disease by 53 percent.   There is no safe amount of trans fats, so try to keep them as far from your plate as possible.  -->  Avoid foods that are concentrated in sugar (even dried fruit and fruit juice). Sugary foods can elevate triglyceride levels in the blood, so keep them to a bare minimum.  --> Swap out refined carbohydrates for whole grains.  Refined carbohydrates -- like white rice, regular pasta, and anything made with white or "enriched" flour (including white bread, rolls,  cereals, buns, and crackers) -- raise blood sugar and insulin levels more than fiber-rich whole grains. Higher insulin levels, in turn, can lead to a higher rise in triglycerides after a meal. So, make the switch to whole wheat bread, whole grain pasta, brown or wild rice, and whole grain versions of cereals, crackers, and other bread products. However, it's important to know that individuals with high triglycerides should moderate even their intake of high-quality starches (since all starches raise blood sugar) -- I recommend 1 to 2 servings per meal.  --> Cut way back on alcohol.  If you have high triglycerides, alcohol should be considered a rare treat -- if you indulge at all, since even small amounts of alcohol can dramatically increase triglyceride levels.  --> Incorporate omega-3 fats.  Heart-healthy fish oils are especially rich in omega-3 fatty acids. In multiple studies over the past two decades, people who ate diets high in omega-3s had 30 to 40   percent reductions in heart disease. Although we don't yet know why fish oil works so well, there are several possibilities. Omega-3s seem to reduce inflammation, reduce high blood pressure, decrease triglycerides, raise HDL cholesterol, and make blood thinner and less sticky so it is less likely to clot. It's as close to a food prescription for heart health as it gets. If you have high triglycerides, I recommend eating at least three servings of one of the omega-3-rich fish every week (fatty fish is the most concentrated food form of omega three fats). If you cannot manage to eat that much fish, speak with your physician about taking fish oil capsules, which offer similar benefits.The best foods for omega-3 fatty acids include wild salmon (fresh, canned), herring, mackerel (not king), sardines, anchovies, rainbow trout, and Pacific oysters. Non-fish sources of omega-3 fats include omega-3-fortified eggs, ground flaxseed, chia seeds, walnuts, butternuts  (white walnuts), seaweed, walnut oil, canola oil, and soybeans.  --> Quit smoking.  Smoking causes inflammation, not just in your lungs, but throughout your body. Inflammation can contribute to atherosclerosis, blood clots, and risk of heart attack. Smoking makes all heart health indicators worse. If you have high cholesterol, high triglycerides, or high blood pressure, smoking magnifies the danger.  --> Become more physically active.  Even moderate exercise can help improve cholesterol, triglycerides, and blood pressure. Aerobic exercise seems to be able to stop the sharp rise of triglycerides after eating, perhaps because of a decrease in the amount of triglyceride released by the liver, or because active muscle clears triglycerides out of the blood stream more quickly than inactive muscle. If you haven't exercised regularly (or at all) for years, I recommend starting slowly, by walking at an easy pace for 15 minutes a day. Then, as you feel more comfortable, increase the amount. Your ultimate goal should be at least 30 minutes of moderate physical activity, at least five days a week. 

## 2018-01-14 NOTE — Progress Notes (Signed)
Impression and Recommendations:    1. Elevated LDL cholesterol level   2. Hypertriglyceridemia   3. Smoking greater than 30 pack years   4. Other specified hypothyroidism   5. Vitamin D insufficiency   6. Dysfunction of Eustachian tube, unspecified laterality     1. Elevated LDL cholesterol level- From last OV 09-17-17, Pt's LDL was 190 and total cholesterol 265. Pt is intolerant to statins but had been on zetia in the past. She has been off all meds since 03-2017. She has been working on lifestyle modifications like diet and exercise since 09-17-17.  -Recheck fasting lipid profile in near future when she is fasting. If her cholesterol is still elevated, pt agrees to start zetia again.  2. Hypertriglyceridemia- Dietary and exercise guidelines discussed with patient.  Recommended eating whole-grain cheerios for breakfast.   3. Smoking greater than 30 pack years - recommended pt stop smoking.  4. Other specified hypothyroidism- pt stable at this time. Asymptomatic at this time. Tolerating meds well, continue as listed below.  5. Vitamin D insufficiency- continue taking supplements.  6. Dysfunction of eustachian tube: use a neti pot or sinus rinse, followed by flonase one spray in each nostril.  -Continue exercising!  Orders Placed This Encounter  Procedures  . [facility] SCREENING [facility]  . Lipid panel    Meds ordered this encounter  Medications  . fluticasone (FLONASE) 50 MCG/ACT nasal spray    Sig: 1 spray into each nostril twice daily after your sinus rinse    Dispense:  16 g    Refill:  2    OTC    Gross side effects, risk and benefits, and alternatives of medications and treatment plan in general discussed with patient.  Patient is aware that all medications have potential side effects and we are unable to predict every side effect or drug-drug interaction that may occur.   Patient will call with any questions prior to using medication if they have concerns.   Expresses verbal understanding and consents to current therapy and treatment regimen.  No barriers to understanding were identified.  Red flag symptoms and signs discussed in detail.  Patient expressed understanding regarding what to do in case of emergency\urgent symptoms  Please see AVS handed out to patient at the end of our visit for further patient instructions/ counseling done pertaining to today's office visit.  Return in about 4 months (around 05/14/2018).    Note: This note was prepared with assistance of Dragon voice recognition software. Occasional wrong-word or sound-a-like substitutions may have occurred due to the inherent limitations of voice recognition software.  This document serves as a record of services personally performed by Mellody Dance, DO. It was created on her behalf by Mayer Masker, a trained medical scribe. The creation of this record is based on the scribe's personal observations and the provider's statements to them.   I have reviewed the above medical documentation for accuracy and completeness and I concur.  Mellody Dance 01/14/18 5:38 PM  -------------------------------------------------------------------------------------------------------------------------------------------------------------------------------------------------------------------------------------------- Subjective:    HPI: Lori Montoya is a 67 y.o. female who presents to [place] at [facility] today for issues as discussed below.  Shingrix: getting today at her pharmacist.  Breast: She has to have bilateral breast surgery. There is a mass that is possibly benign in her L breast and is having a lumpectomy. She has concerns because they are uneven, and when they remove the lump from her L breast, she is  going to have the R breast reduced as well.   Ears: She has had ringing in her ears since a recent cold. When she is in the quiet, she had ringing in her ears that she  describes as hearing "crickets in the woods". She also had dizziness. She has taken Sudafed and taken 3-4 pills of doxycycline from a previous prescription. She denies hearing loss.  CHOL HPI:   Last OV 09-17-17: LDL was 190. Pt is interolant to statins and has been on in the past, but this has elevated her liver enzymes in the past. She has taken zetia which has been tolerated well, but had little effect on her. She had been off all meds for 5 months, and has not been on meds since 03-2017.  -  She  is currently managed with:  See med list from today.   Since 1 month, she has been using her treadmill, 30 minutes daily, as well as doing sit ups and crunches. For the last 4 months, she has been eating a lot of salmon and steamed vegetables. She has decreased her red meat intake. She has been eating raisin bran and stopped eating cereal bars in the morning. She denies leg swelling.  No other s-e.  Last lipid panel as follows:  Lab Results  Component Value Date   CHOL 265 (H) 09/17/2017   HDL 50 09/17/2017   LDLCALC 190 (H) 09/17/2017   TRIG 127 09/17/2017   CHOLHDL 5.3 (H) 09/17/2017    Hepatic Function Latest Ref Rng & Units 09/17/2017 04/04/2017 02/20/2017  Total Protein 6.0 - 8.5 g/dL 7.3 7.1 7.1  Albumin 3.6 - 4.8 g/dL 4.5 4.3 4.1  AST 0 - 40 IU/L 40 35 47(H)  ALT 0 - 32 IU/L 28 25 36(H)  Alk Phosphatase 39 - 117 IU/L 161(H) 152(H) 181(H)  Total Bilirubin 0.0 - 1.2 mg/dL 0.4 0.3 0.3  Bilirubin, Direct 0.00 - 0.40 mg/dL - 0.10 -   Wt Readings from Last 3 Encounters:  01/14/18 152 lb 8 oz (69.2 kg)  09/17/17 156 lb 4.8 oz (70.9 kg)  05/16/17 157 lb 11.2 oz (71.5 kg)   BP Readings from Last 3 Encounters:  01/14/18 136/79  09/17/17 138/76  05/16/17 132/78   Pulse Readings from Last 3 Encounters:  01/14/18 71  09/17/17 65  05/16/17 67   BMI Readings from Last 3 Encounters:  01/14/18 23.88 kg/m  09/17/17 24.48 kg/m  05/16/17 24.70 kg/m     Patient Care Team     Relationship Specialty Notifications Start End  Mellody Dance, DO PCP - General Family Medicine  09/12/16   Teena Irani, MD (Inactive) Consulting Physician Gastroenterology  09/12/16   Jerrell Belfast, MD Consulting Physician Otolaryngology  09/12/16   Darlin Coco, MD  Cardiology  09/12/16      Patient Active Problem List   Diagnosis Date Noted  . Smoking greater than 30 pack years 09/12/2016    Priority: High  . Hypothyroidism 09/12/2016    Priority: High  . Hyperlipidemia 09/12/2016    Priority: High  . COPD (chronic obstructive pulmonary disease) ? 09/12/2016    Priority: High  . GERD (gastroesophageal reflux disease) 09/12/2016    Priority: High  . Vitamin D insufficiency 11/03/2016    Priority: Medium  . Osteoporosis 09/12/2016    Priority: Low  . Elevated liver enzymes 09/17/2017  . Other fatigue 02/20/2017  . Myalgia 02/20/2017  . Degenerative disc disease, lumbar 09/12/2016  . Elevated alkaline phosphatase level 09/12/2016  .  Rosacea 09/12/2016  . 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 Intermittent palpitations 07/20/2014  . Frequent unifocal PVCs 07/20/2014    Past Medical history, Surgical history, Family history, Social history, Allergies and Medications have been entered into the medical record, reviewed and changed as needed.    Current Meds  Medication Sig  . Calcium Carbonate ([employer] CALCIUM) 1500 MG TABS Take by mouth.  . Cholecalciferol (VITAMIN D3) 1000 units CAPS Take 5 capsules (5,000 Units total) by mouth daily.  Marland Kitchen levothyroxine (SYNTHROID, LEVOTHROID) 100 MCG tablet TAKE 1 TABLET BY MOUTH DAILY.  . Menaquinone-7 (VITAMIN K2 PO) Take 1 tablet by mouth daily.  . Omega-3 Fatty Acids (FISH OIL) 1200 MG CAPS Take by mouth.    Allergies:  Allergies  Allergen Reactions  . Alendronate Other (See Comments)    Bone pain     Review of Systems:  A fourteen system review of systems was performed and found to be positive as per  HPI.   Objective:   Blood pressure 136/79, pulse 71, height 5' 7" (1.702 m), weight 152 lb 8 oz (69.2 kg), SpO2 98 %. Body mass index is 23.88 kg/m. General:  Well Developed, well nourished, appropriate for stated age.  Neuro:  Alert and oriented,  extra-ocular muscles intact  HEENT:  Normocephalic, atraumatic, neck supple, no carotid bruits appreciated. Ears: R TM retracted with air fluid levels. L TM opaque, tiny air fluid levels, mild bulge, no erythema, EAC normal Skin:  no gross rash, warm, pink. Cardiac:  RRR, S1 S2 Respiratory:  ECTA B/L and A/P, Not using accessory muscles, speaking in full sentences- unlabored. Vascular:  Ext warm, no cyanosis apprec.; cap RF less 2 sec. Psych:  No HI/SI, judgement and insight good, Euthymic mood. Full Affect.

## 2018-01-15 ENCOUNTER — Other Ambulatory Visit (INDEPENDENT_AMBULATORY_CARE_PROVIDER_SITE_OTHER): Payer: Medicare Other

## 2018-01-15 DIAGNOSIS — E7800 Pure hypercholesterolemia, unspecified: Secondary | ICD-10-CM | POA: Diagnosis not present

## 2018-01-16 LAB — LIPID PANEL
Chol/HDL Ratio: 6.5 ratio — ABNORMAL HIGH (ref 0.0–4.4)
Cholesterol, Total: 252 mg/dL — ABNORMAL HIGH (ref 100–199)
HDL: 39 mg/dL — AB (ref >39)
LDL Calculated: 187 mg/dL — ABNORMAL HIGH (ref 0–99)
Triglycerides: 130 mg/dL (ref 0–149)
VLDL CHOLESTEROL CAL: 26 mg/dL (ref 5–40)

## 2018-01-29 DIAGNOSIS — N649 Disorder of breast, unspecified: Secondary | ICD-10-CM | POA: Diagnosis not present

## 2018-02-05 ENCOUNTER — Other Ambulatory Visit: Payer: Self-pay | Admitting: Surgery

## 2018-02-05 DIAGNOSIS — N6011 Diffuse cystic mastopathy of right breast: Secondary | ICD-10-CM

## 2018-02-05 DIAGNOSIS — N6021 Fibroadenosis of right breast: Secondary | ICD-10-CM

## 2018-02-05 DIAGNOSIS — N6310 Unspecified lump in the right breast, unspecified quadrant: Secondary | ICD-10-CM

## 2018-02-05 DIAGNOSIS — N6012 Diffuse cystic mastopathy of left breast: Principal | ICD-10-CM

## 2018-02-19 ENCOUNTER — Encounter (HOSPITAL_BASED_OUTPATIENT_CLINIC_OR_DEPARTMENT_OTHER): Payer: Self-pay | Admitting: *Deleted

## 2018-02-22 ENCOUNTER — Ambulatory Visit: Payer: Self-pay | Admitting: Plastic Surgery

## 2018-02-22 DIAGNOSIS — N62 Hypertrophy of breast: Secondary | ICD-10-CM

## 2018-02-22 DIAGNOSIS — N6489 Other specified disorders of breast: Secondary | ICD-10-CM

## 2018-02-22 DIAGNOSIS — N649 Disorder of breast, unspecified: Secondary | ICD-10-CM

## 2018-02-25 NOTE — Progress Notes (Signed)
Ensure pre surgery drink given with instructions to complete by 0500 dos, surgical soap given with instructions, pt verbalized understanding. 

## 2018-02-27 ENCOUNTER — Ambulatory Visit
Admission: RE | Admit: 2018-02-27 | Discharge: 2018-02-27 | Disposition: A | Discharge status: Home / Self Care | Payer: Medicare Other | Source: Ambulatory Visit | Attending: Surgery | Admitting: Surgery

## 2018-02-27 DIAGNOSIS — N6310 Unspecified lump in the right breast, unspecified quadrant: Secondary | ICD-10-CM

## 2018-02-27 DIAGNOSIS — R928 Other abnormal and inconclusive findings on diagnostic imaging of breast: Secondary | ICD-10-CM | POA: Diagnosis not present

## 2018-02-27 NOTE — H&P (Signed)
Lori Montoya is an 67 y.o. female.   Chief Complaint: breast cancer / asymmetry HPI: The patient is a 67 y.o. female is here for right breast oncoplastic reduction and left breast reduction for symmetry.  She underwent a routine mammogram and was found to have irregularities.  It is not clear if it is cancer (very concerning) but the plan is for a right partial mastectomy.  Her breasts are extremely large and the left is larger than the right by ~ 250 cc.  She is 5 feet 7 inches tall and weighs 151 pounds.   Preoperative bra size = 46DD cup.   Nipple to inframammary fold distance:  14 cm Sternal Noth to nipple distance:  Right = 26 cm, Left = 28 cm Internipple distance:  25 cm Areolar Diameter:  7 cm The estimated excess breast tissue to be removed at the time of surgery = 350 grams on the left and 250 grams on the right.     Past Medical History:  Diagnosis Date  . Elevated alkaline phosphatase level   . GERD (gastroesophageal reflux disease)   . Hyperlipidemia   . Osteoporosis   . Other and unspecified disc disorder of lumbar region   . Personal history of other disorders of nervous system and sense organs   . Seizures ([place])    last seizure in 1977  . Tobacco use disorder   . Unspecified hypothyroidism     Past Surgical History:  Procedure Laterality Date  . CHOLECYSTECTOMY  2011   lap choli  . COLONOSCOPY    . MICROLARYNGOSCOPY Left 01/01/2015   Procedure: MICROLARYNGOSCOPY WITH EXCISION OF LEFT VOCAL CORD NODULE ;  Surgeon: Jerrell Belfast, MD;  Location: [place];  Service: ENT;  Laterality: Left;    Family History  Problem Relation Age of Onset  . Hyperlipidemia Mother   . Hypertension Mother   . CVA Mother   . Alcohol abuse Mother   . Dementia Mother   . Hypothyroidism Mother   . Cancer - Other Father        liver  . Alcohol abuse Brother   . ALS Brother   . Dementia Maternal Grandmother   . Stroke Maternal Grandfather   . Breast cancer  Paternal Aunt        unsure of age  . Breast cancer Paternal Aunt        unsure of age   Social History:  reports that she quit smoking about 3 years ago. Her smoking use included cigarettes. She has a 48.00 pack-year smoking history. She has never used smokeless tobacco. She reports that she does not drink alcohol or use drugs.  Allergies:  Allergies  Allergen Reactions  . Alendronate Other (See Comments)    Bone pain    No medications prior to admission.    No results found for this or any previous visit (from the past 48 hour(s)). No results found.  Review of Systems  Constitutional: Negative.   HENT: Negative.   Eyes: Negative.   Respiratory: Negative.   Cardiovascular: Negative.   Genitourinary: Negative.   Musculoskeletal: Positive for back pain and neck pain.  Skin: Negative.   Neurological: Negative.   Psychiatric/Behavioral: Negative.     Height 5\' 7"  (1.702 m), weight 69.9 kg (154 lb). Physical Exam  Constitutional: She is oriented to person, place, and time. She appears well-developed and well-nourished.  HENT:  Head: Normocephalic and atraumatic.  Eyes: Pupils are equal, round, and reactive to light.  Conjunctivae and EOM are normal.  Cardiovascular: Normal rate.  Respiratory: Effort normal.  Neurological: She is alert and oriented to person, place, and time.  Skin: Skin is warm.  Psychiatric: She has a normal mood and affect. Her behavior is normal. Judgment and thought content normal.     Assessment/Plan Plan for bilateral breast reduction.  [place], DO 02/27/2018, 9:04 AM

## 2018-02-28 ENCOUNTER — Encounter (HOSPITAL_BASED_OUTPATIENT_CLINIC_OR_DEPARTMENT_OTHER)
Admission: RE | Disposition: A | Discharge status: Home / Self Care | Payer: Self-pay | Source: Ambulatory Visit | Attending: Surgery

## 2018-02-28 ENCOUNTER — Other Ambulatory Visit: Payer: Self-pay

## 2018-02-28 ENCOUNTER — Ambulatory Visit
Admission: RE | Admit: 2018-02-28 | Discharge: 2018-02-28 | Disposition: A | Discharge status: Home / Self Care | Payer: Medicare Other | Source: Ambulatory Visit | Attending: Surgery | Admitting: Surgery

## 2018-02-28 ENCOUNTER — Encounter (HOSPITAL_BASED_OUTPATIENT_CLINIC_OR_DEPARTMENT_OTHER): Payer: Self-pay | Admitting: *Deleted

## 2018-02-28 ENCOUNTER — Ambulatory Visit (HOSPITAL_BASED_OUTPATIENT_CLINIC_OR_DEPARTMENT_OTHER): Payer: Medicare Other | Admitting: Anesthesiology

## 2018-02-28 ENCOUNTER — Ambulatory Visit (HOSPITAL_BASED_OUTPATIENT_CLINIC_OR_DEPARTMENT_OTHER)
Admission: RE | Admit: 2018-02-28 | Discharge: 2018-02-28 | Disposition: A | Discharge status: Home / Self Care | Payer: Medicare Other | Source: Ambulatory Visit | Attending: Surgery | Admitting: Surgery

## 2018-02-28 DIAGNOSIS — E039 Hypothyroidism, unspecified: Secondary | ICD-10-CM | POA: Diagnosis not present

## 2018-02-28 DIAGNOSIS — R222 Localized swelling, mass and lump, trunk: Secondary | ICD-10-CM | POA: Diagnosis not present

## 2018-02-28 DIAGNOSIS — Z79899 Other long term (current) drug therapy: Secondary | ICD-10-CM | POA: Diagnosis not present

## 2018-02-28 DIAGNOSIS — Z7951 Long term (current) use of inhaled steroids: Secondary | ICD-10-CM | POA: Insufficient documentation

## 2018-02-28 DIAGNOSIS — Z79890 Hormone replacement therapy (postmenopausal): Secondary | ICD-10-CM | POA: Diagnosis not present

## 2018-02-28 DIAGNOSIS — N6012 Diffuse cystic mastopathy of left breast: Secondary | ICD-10-CM | POA: Insufficient documentation

## 2018-02-28 DIAGNOSIS — N6489 Other specified disorders of breast: Secondary | ICD-10-CM | POA: Insufficient documentation

## 2018-02-28 DIAGNOSIS — N6011 Diffuse cystic mastopathy of right breast: Secondary | ICD-10-CM | POA: Diagnosis not present

## 2018-02-28 DIAGNOSIS — N6091 Unspecified benign mammary dysplasia of right breast: Secondary | ICD-10-CM | POA: Insufficient documentation

## 2018-02-28 DIAGNOSIS — N651 Disproportion of reconstructed breast: Secondary | ICD-10-CM | POA: Diagnosis not present

## 2018-02-28 DIAGNOSIS — R928 Other abnormal and inconclusive findings on diagnostic imaging of breast: Secondary | ICD-10-CM | POA: Diagnosis not present

## 2018-02-28 DIAGNOSIS — Z8249 Family history of ischemic heart disease and other diseases of the circulatory system: Secondary | ICD-10-CM | POA: Insufficient documentation

## 2018-02-28 DIAGNOSIS — N6081 Other benign mammary dysplasias of right breast: Secondary | ICD-10-CM | POA: Diagnosis not present

## 2018-02-28 DIAGNOSIS — N6021 Fibroadenosis of right breast: Secondary | ICD-10-CM | POA: Diagnosis not present

## 2018-02-28 DIAGNOSIS — N62 Hypertrophy of breast: Secondary | ICD-10-CM | POA: Diagnosis not present

## 2018-02-28 DIAGNOSIS — K219 Gastro-esophageal reflux disease without esophagitis: Secondary | ICD-10-CM | POA: Diagnosis not present

## 2018-02-28 DIAGNOSIS — N649 Disorder of breast, unspecified: Secondary | ICD-10-CM

## 2018-02-28 DIAGNOSIS — E785 Hyperlipidemia, unspecified: Secondary | ICD-10-CM | POA: Diagnosis not present

## 2018-02-28 DIAGNOSIS — M542 Cervicalgia: Secondary | ICD-10-CM | POA: Diagnosis not present

## 2018-02-28 DIAGNOSIS — R921 Mammographic calcification found on diagnostic imaging of breast: Secondary | ICD-10-CM | POA: Diagnosis not present

## 2018-02-28 DIAGNOSIS — N6310 Unspecified lump in the right breast, unspecified quadrant: Secondary | ICD-10-CM

## 2018-02-28 DIAGNOSIS — M549 Dorsalgia, unspecified: Secondary | ICD-10-CM | POA: Diagnosis not present

## 2018-02-28 HISTORY — PX: BREAST REDUCTION SURGERY: SHX8

## 2018-02-28 HISTORY — PX: BREAST LUMPECTOMY WITH RADIOACTIVE SEED LOCALIZATION: SHX6424

## 2018-02-28 SURGERY — BREAST LUMPECTOMY WITH RADIOACTIVE SEED LOCALIZATION
Anesthesia: General | Site: Breast | Laterality: Right

## 2018-02-28 MED ORDER — MIDAZOLAM HCL 2 MG/2ML IJ SOLN
INTRAMUSCULAR | Status: AC
  Filled 2018-02-28: qty 2

## 2018-02-28 MED ORDER — BUPIVACAINE-EPINEPHRINE 0.25% -1:200000 IJ SOLN
INTRAMUSCULAR | Status: DC | PRN
  Administered 2018-02-28: 20 mL

## 2018-02-28 MED ORDER — MIDAZOLAM HCL 5 MG/5ML IJ SOLN
INTRAMUSCULAR | Status: DC | PRN
  Administered 2018-02-28: 2 mg via INTRAVENOUS

## 2018-02-28 MED ORDER — FENTANYL CITRATE (PF) 100 MCG/2ML IJ SOLN
50.0000 ug | INTRAMUSCULAR | Status: AC | PRN
  Administered 2018-02-28: 50 ug via INTRAVENOUS
  Administered 2018-02-28: 100 ug via INTRAVENOUS
  Administered 2018-02-28 (×5): 50 ug via INTRAVENOUS

## 2018-02-28 MED ORDER — FENTANYL CITRATE (PF) 100 MCG/2ML IJ SOLN
INTRAMUSCULAR | Status: AC
  Filled 2018-02-28: qty 2

## 2018-02-28 MED ORDER — NITROGLYCERIN 2 % TD OINT
TOPICAL_OINTMENT | TRANSDERMAL | Status: AC
  Filled 2018-02-28: qty 30

## 2018-02-28 MED ORDER — SCOPOLAMINE 1 MG/3DAYS TD PT72: 1.0000 | MEDICATED_PATCH | Freq: Once | TRANSDERMAL | Status: DC | PRN

## 2018-02-28 MED ORDER — ROCURONIUM BROMIDE 100 MG/10ML IV SOLN
INTRAVENOUS | Status: DC | PRN
  Administered 2018-02-28: 30 mg via INTRAVENOUS

## 2018-02-28 MED ORDER — SODIUM CHLORIDE 0.9% FLUSH: 3.0000 mL | INTRAVENOUS | Status: DC | PRN

## 2018-02-28 MED ORDER — SODIUM CHLORIDE 0.9% FLUSH: 3.0000 mL | Freq: Two times a day (BID) | INTRAVENOUS | Status: DC

## 2018-02-28 MED ORDER — SUGAMMADEX SODIUM 200 MG/2ML IV SOLN
INTRAVENOUS | Status: AC
  Filled 2018-02-28: qty 2

## 2018-02-28 MED ORDER — CHLORHEXIDINE GLUCONATE CLOTH 2 % EX PADS: 6.0000 | MEDICATED_PAD | Freq: Once | CUTANEOUS | Status: DC

## 2018-02-28 MED ORDER — SODIUM CHLORIDE 0.9 % IV SOLN: 250.0000 mL | INTRAVENOUS | Status: DC | PRN

## 2018-02-28 MED ORDER — CEFAZOLIN SODIUM-DEXTROSE 2-4 GM/100ML-% IV SOLN
2.0000 g | INTRAVENOUS | Status: AC
  Administered 2018-02-28: 2 g via INTRAVENOUS

## 2018-02-28 MED ORDER — ONDANSETRON HCL 4 MG/2ML IJ SOLN
INTRAMUSCULAR | Status: DC | PRN
  Administered 2018-02-28: 4 mg via INTRAVENOUS

## 2018-02-28 MED ORDER — PROPOFOL 10 MG/ML IV BOLUS
INTRAVENOUS | Status: AC
  Filled 2018-02-28: qty 20

## 2018-02-28 MED ORDER — PROPOFOL 10 MG/ML IV BOLUS
INTRAVENOUS | Status: DC | PRN
  Administered 2018-02-28: 20 mg via INTRAVENOUS
  Administered 2018-02-28: 150 mg via INTRAVENOUS

## 2018-02-28 MED ORDER — ONDANSETRON HCL 4 MG/2ML IJ SOLN: 4.0000 mg | Freq: Once | INTRAMUSCULAR | Status: DC | PRN

## 2018-02-28 MED ORDER — MIDAZOLAM HCL 2 MG/2ML IJ SOLN: 1.0000 mg | INTRAMUSCULAR | Status: DC | PRN

## 2018-02-28 MED ORDER — GABAPENTIN 300 MG PO CAPS
ORAL_CAPSULE | ORAL | Status: AC
  Filled 2018-02-28: qty 1

## 2018-02-28 MED ORDER — NITROGLYCERIN 2 % TD OINT: 0.5000 [in_us] | TOPICAL_OINTMENT | Freq: Four times a day (QID) | TRANSDERMAL | Status: DC

## 2018-02-28 MED ORDER — CEFAZOLIN SODIUM-DEXTROSE 2-4 GM/100ML-% IV SOLN
INTRAVENOUS | Status: AC
  Filled 2018-02-28: qty 100

## 2018-02-28 MED ORDER — DEXAMETHASONE SODIUM PHOSPHATE 4 MG/ML IJ SOLN
INTRAMUSCULAR | Status: DC | PRN
  Administered 2018-02-28: 10 mg via INTRAVENOUS

## 2018-02-28 MED ORDER — EPHEDRINE SULFATE 50 MG/ML IJ SOLN
INTRAMUSCULAR | Status: DC | PRN
  Administered 2018-02-28 (×2): 10 mg via INTRAVENOUS

## 2018-02-28 MED ORDER — OXYCODONE HCL 5 MG PO TABS
ORAL_TABLET | ORAL | Status: AC
  Filled 2018-02-28: qty 1

## 2018-02-28 MED ORDER — FENTANYL CITRATE (PF) 100 MCG/2ML IJ SOLN
25.0000 ug | INTRAMUSCULAR | Status: DC | PRN
  Administered 2018-02-28: 25 ug via INTRAVENOUS
  Administered 2018-02-28: 50 ug via INTRAVENOUS

## 2018-02-28 MED ORDER — GABAPENTIN 300 MG PO CAPS
300.0000 mg | ORAL_CAPSULE | ORAL | Status: AC
  Administered 2018-02-28: 300 mg via ORAL

## 2018-02-28 MED ORDER — DEXTROSE 5 % IV SOLN: 3.0000 g | INTRAVENOUS | Status: DC

## 2018-02-28 MED ORDER — LIDOCAINE HCL (CARDIAC) 20 MG/ML IV SOLN
INTRAVENOUS | Status: DC | PRN
  Administered 2018-02-28: 30 mg via INTRAVENOUS

## 2018-02-28 MED ORDER — PHENYLEPHRINE 40 MCG/ML (10ML) SYRINGE FOR IV PUSH (FOR BLOOD PRESSURE SUPPORT)
PREFILLED_SYRINGE | INTRAVENOUS | Status: AC
  Filled 2018-02-28: qty 10

## 2018-02-28 MED ORDER — ACETAMINOPHEN 500 MG PO TABS
ORAL_TABLET | ORAL | Status: AC
  Filled 2018-02-28: qty 2

## 2018-02-28 MED ORDER — SUGAMMADEX SODIUM 200 MG/2ML IV SOLN
INTRAVENOUS | Status: DC | PRN
  Administered 2018-02-28: 200 mg via INTRAVENOUS

## 2018-02-28 MED ORDER — LACTATED RINGERS IV SOLN
INTRAVENOUS | Status: DC
  Administered 2018-02-28 (×3): via INTRAVENOUS

## 2018-02-28 MED ORDER — SODIUM CHLORIDE 0.9 % IV SOLN
INTRAVENOUS | Status: DC | PRN
  Administered 2018-02-28: 500 mL

## 2018-02-28 MED ORDER — ACETAMINOPHEN 500 MG PO TABS
1000.0000 mg | ORAL_TABLET | ORAL | Status: AC
  Administered 2018-02-28: 1000 mg via ORAL

## 2018-02-28 MED ORDER — PHENYLEPHRINE HCL 10 MG/ML IJ SOLN
INTRAMUSCULAR | Status: DC | PRN
  Administered 2018-02-28 (×2): 80 ug via INTRAVENOUS

## 2018-02-28 MED ORDER — NITROGLYCERIN 2 % TD OINT
TOPICAL_OINTMENT | TRANSDERMAL | Status: DC | PRN
  Administered 2018-02-28: 1 [in_us] via TOPICAL

## 2018-02-28 MED ORDER — OXYCODONE HCL 5 MG PO TABS
5.0000 mg | ORAL_TABLET | ORAL | Status: DC | PRN
  Administered 2018-02-28: 5 mg via ORAL

## 2018-02-28 SURGICAL SUPPLY — 62 items
ADH SKN CLS APL DERMABOND .7 (GAUZE/BANDAGES/DRESSINGS) ×4
BAG DECANTER FOR FLEXI CONT (MISCELLANEOUS) ×3 IMPLANT
BINDER BREAST LRG (GAUZE/BANDAGES/DRESSINGS) ×1 IMPLANT
BLADE HEX COATED 2.75 (ELECTRODE) ×3 IMPLANT
BLADE KNIFE PERSONA 10 (BLADE) ×7 IMPLANT
BLADE SURG 15 STRL LF DISP TIS (BLADE) ×2 IMPLANT
BLADE SURG 15 STRL SS (BLADE) ×6
CANISTER SUCT 1200ML W/VALVE (MISCELLANEOUS) ×3 IMPLANT
CHLORAPREP W/TINT 26ML (MISCELLANEOUS) ×5 IMPLANT
CLIP VESOCCLUDE MED 6/CT (CLIP) ×1 IMPLANT
COVER BACK TABLE 60X90IN (DRAPES) ×5 IMPLANT
COVER MAYO STAND STRL (DRAPES) ×6 IMPLANT
COVER PROBE W GEL 5X96 (DRAPES) ×3 IMPLANT
DERMABOND ADVANCED (GAUZE/BANDAGES/DRESSINGS) ×2
DERMABOND ADVANCED .7 DNX12 (GAUZE/BANDAGES/DRESSINGS) ×2 IMPLANT
DEVICE DUBIN W/COMP PLATE 8390 (MISCELLANEOUS) ×3 IMPLANT
DRAPE LAPAROSCOPIC ABDOMINAL (DRAPES) ×3 IMPLANT
DRAPE UTILITY XL STRL (DRAPES) ×3 IMPLANT
DRSG PAD ABDOMINAL 8X10 ST (GAUZE/BANDAGES/DRESSINGS) ×6 IMPLANT
ELECT BLADE 4.0 EZ CLEAN MEGAD (MISCELLANEOUS) ×3
ELECT COATED BLADE 2.86 ST (ELECTRODE) ×3 IMPLANT
ELECT REM PT RETURN 9FT ADLT (ELECTROSURGICAL) ×3
ELECTRODE BLDE 4.0 EZ CLN MEGD (MISCELLANEOUS) IMPLANT
ELECTRODE REM PT RTRN 9FT ADLT (ELECTROSURGICAL) ×4 IMPLANT
GAUZE SPONGE 4X4 12PLY STRL (GAUZE/BANDAGES/DRESSINGS) ×1 IMPLANT
GAUZE SPONGE 4X4 12PLY STRL LF (GAUZE/BANDAGES/DRESSINGS) ×3 IMPLANT
GLOVE BIO SURGEON STRL SZ 6.5 (GLOVE) ×9 IMPLANT
GLOVE BIOGEL PI IND STRL 7.5 (GLOVE) IMPLANT
GLOVE BIOGEL PI IND STRL 8 (GLOVE) ×2 IMPLANT
GLOVE BIOGEL PI INDICATOR 7.5 (GLOVE) ×2
GLOVE BIOGEL PI INDICATOR 8 (GLOVE) ×1
GLOVE ECLIPSE 8.0 STRL XLNG CF (GLOVE) ×3 IMPLANT
GLOVE SURG SS PI 7.5 STRL IVOR (GLOVE) ×1 IMPLANT
GLOVE SURG SYN 7.5  E (GLOVE) ×1
GLOVE SURG SYN 7.5 E (GLOVE) ×2 IMPLANT
GLOVE SURG SYN 7.5 PF PI (GLOVE) IMPLANT
GOWN STRL REUS W/ TWL LRG LVL3 (GOWN DISPOSABLE) ×10 IMPLANT
GOWN STRL REUS W/TWL LRG LVL3 (GOWN DISPOSABLE) ×15
KIT MARKER MARGIN INK (KITS) ×3 IMPLANT
NDL HYPO 25X1 1.5 SAFETY (NEEDLE) ×4 IMPLANT
NEEDLE HYPO 25X1 1.5 SAFETY (NEEDLE) ×3 IMPLANT
PACK BASIN DAY SURGERY FS (CUSTOM PROCEDURE TRAY) ×6 IMPLANT
PENCIL BUTTON HOLSTER BLD 10FT (ELECTRODE) ×5 IMPLANT
SLEEVE SCD COMPRESS KNEE MED (MISCELLANEOUS) ×5 IMPLANT
SPONGE LAP 18X18 RF (DISPOSABLE) ×7 IMPLANT
SPONGE LAP 4X18 X RAY DECT (DISPOSABLE) ×3 IMPLANT
STRIP SUTURE WOUND CLOSURE 1/2 (SUTURE) ×6 IMPLANT
SUT MNCRL AB 4-0 PS2 18 (SUTURE) ×11 IMPLANT
SUT MON AB 3-0 SH 27 (SUTURE) ×12
SUT MON AB 3-0 SH27 (SUTURE) ×2 IMPLANT
SUT MON AB 5-0 PS2 18 (SUTURE) ×7 IMPLANT
SUT VIC AB 4-0 SH 27 (SUTURE) ×3
SUT VIC AB 4-0 SH 27XANBCTRL (SUTURE) IMPLANT
SUT VICRYL 3-0 CR8 SH (SUTURE) ×3 IMPLANT
SYR BULB IRRIGATION 50ML (SYRINGE) ×3 IMPLANT
SYR CONTROL 10ML LL (SYRINGE) ×5 IMPLANT
TAPE MEASURE VINYL STERILE (MISCELLANEOUS) ×3 IMPLANT
TOWEL OR 17X24 6PK STRL BLUE (TOWEL DISPOSABLE) ×9 IMPLANT
TOWEL OR NON WOVEN STRL DISP B (DISPOSABLE) ×3 IMPLANT
TUBE CONNECTING 20X1/4 (TUBING) ×3 IMPLANT
UNDERPAD 30X30 (UNDERPADS AND DIAPERS) ×6 IMPLANT
YANKAUER SUCT BULB TIP NO VENT (SUCTIONS) ×3 IMPLANT

## 2018-02-28 NOTE — Op Note (Signed)
First Assist Op Note: [facility] I assisted the Surgeon(s) _____Dr. Lyndee Leo Dillingham_ on the procedure(s): _Right oncoplastic breast reduction and left breast reduction___________on Date ___4/11/19______  I provided my assistance on this case as follows:  I was present and acted as first Environmental consultant during this operation. I was present during the patient transport into the operative suite and assisted the OR staff with transferring and positioning of the patient. All extremities were checked and properly cushioned and safety straps in place. I was involved in the prepping and placement of sterile drapes. A time out was performed and all information confirmed to be correct.  I first assisted during the case including retraction for exposure, assisting with closure of surgical wounds and application of sterile dressings. I provided assistance with application of post operative garments/splinting and assisted with patient transfer back to the stretcher as needed.   RAYBURN,SHAWN,PA-C Plastic Surgery [phone]

## 2018-02-28 NOTE — Discharge Instructions (Signed)
May shower tomorrow No heavy lifting Continue with the binder  May Take Pain Medicine again after 7:30pm   Post Anesthesia Home Care Instructions  Activity: Get plenty of rest for the remainder of the day. A responsible individual must stay with you for 24 hours following the procedure.  For the next 24 hours, DO NOT: -Drive a car -Paediatric nurse -Drink alcoholic beverages -Take any medication unless instructed by your physician -Make any legal decisions or sign important papers.  Meals: Start with liquid foods such as gelatin or soup. Progress to regular foods as tolerated. Avoid greasy, spicy, heavy foods. If nausea and/or vomiting occur, drink only clear liquids until the nausea and/or vomiting subsides. Call your physician if vomiting continues.  Special Instructions/Symptoms: Your throat may feel dry or sore from the anesthesia or the breathing tube placed in your throat during surgery. If this causes discomfort, gargle with warm salt water. The discomfort should disappear within 24 hours.  If you had a scopolamine patch placed behind your ear for the management of post- operative nausea and/or vomiting:  1. The medication in the patch is effective for 72 hours, after which it should be removed.  Wrap patch in a tissue and discard in the trash. Wash hands thoroughly with soap and water. 2. You may remove the patch earlier than 72 hours if you experience unpleasant side effects which may include dry mouth, dizziness or visual disturbances. 3. Avoid touching the patch. Wash your hands with soap and water after contact with the patch.

## 2018-02-28 NOTE — Interval H&P Note (Signed)
History and Physical Interval Note:  02/28/2018 8:44 AM  Lori Montoya  has presented today for surgery, with the diagnosis of right breast mass  The various methods of treatment have been discussed with the patient and family. After consideration of risks, benefits and other options for treatment, the patient has consented to  Procedure(s): RIGHT BREAST LUMPECTOMY WITH RADIOACTIVE SEED LOCALIZATION ERAS PATHWAY (Right) BILATERA BREAST REDUCTION FOR SYMMERTY (Bilateral) as a surgical intervention .  The patient's history has been reviewed, patient examined, no change in status, stable for surgery.  I have reviewed the patient's chart and labs.  Questions were answered to the patient's satisfaction.     [place]

## 2018-02-28 NOTE — Op Note (Signed)
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 Marla Roe  IV fluids: Per anesthesia record  Indications for procedure: The patient is a 67 year old female who presents with a mammographic abnormality detected on routine screening mammogram right breast upper outer quadrant.  Core biopsy was done which showed a complex sclerosing lesion.  She also has significant breast asymmetry and was seen by plastic surgery where breast reduction was recommended.  She is here today for excision of the right breast complex sclerosing lesion due to its small but present risk of malignancy.  Risk, benefits and other options were discussed.  She was seen by plastic surgery and the risk of reduction were discussed with her.  She agreed to proceed with concomitant right breast seed lumpectomy and bilateral breast reduction.The procedure has been discussed with the patient. Alternatives to surgery have been discussed with the patient.  Risks of surgery include bleeding,  Infection,  Seroma formation, death,  and the need for further surgery.   The patient understands and wishes to proceed.   Description of procedure: The patient was met in the holding area.  Questions were answered.  She was also seen by plastic surgery where her breasts were marked for reduction.  I discussed the procedure with her and the rationale for doing it.  I discussed the possibilities of outcomes.  I discussed postoperative recovery as well and risk of surgery.  The right side was marked as the correct side.  She was taken back to the operating room.  She was placed supine on the operating room table.  After induction of general anesthesia, both breasts were prepped and draped in sterile fashion.  Of note the plastic surgeon and  marked both breasts.  After timeout was done to verify proper patient and procedure, local anesthetic was infiltrated in the right breast just lateral to the nipple areolar complex.  Curvilinear incision was made and with the help of the neoprobe all tissue around the seed and clip were completely excised.  Hemostasis was achieved.  Radiograph revealed the seed and clip to be in the specimen.  Wound was hemostatic.  This point the case, the plastic surgeon took over and will complete the breast reduction and this will be dictated as a separate report.  All counts were correct at this point.  The patient was overall stable.

## 2018-02-28 NOTE — Anesthesia Postprocedure Evaluation (Signed)
Anesthesia Post Note  Patient: Lori Montoya  Procedure(s) Performed: RIGHT BREAST LUMPECTOMY WITH RADIOACTIVE SEED LOCALIZATION ERAS PATHWAY (Right Breast) BILATERA BREAST REDUCTION FOR SYMMERTY (Bilateral Breast)     Patient location during evaluation: PACU Anesthesia Type: General Level of consciousness: awake and alert Pain management: pain level controlled Vital Signs Assessment: post-procedure vital signs reviewed and stable Respiratory status: spontaneous breathing, nonlabored ventilation, respiratory function stable and patient connected to nasal cannula oxygen Cardiovascular status: blood pressure returned to baseline and stable Postop Assessment: no apparent nausea or vomiting Anesthetic complications: no    Last Vitals:  Vitals:   02/28/18 1315 02/28/18 1335  BP: 135/69 (!) 143/70  Pulse: 84 87  Resp: 13 16  Temp:  36.7 C  SpO2: 98% 94%    Last Pain:  Vitals:   02/28/18 1335  PainSc: 1                  Ryan P Ellender

## 2018-02-28 NOTE — Anesthesia Procedure Notes (Signed)
Procedure Name: Intubation Date/Time: 02/28/2018 9:19 AM Performed by: Maryella Shivers, CRNA Pre-anesthesia Checklist: Patient identified, Emergency Drugs available, Suction available and Patient being monitored Patient Re-evaluated:Patient Re-evaluated prior to induction Oxygen Delivery Method: Circle system utilized Preoxygenation: Pre-oxygenation with 100% oxygen Induction Type: IV induction Ventilation: Mask ventilation without difficulty Laryngoscope Size: Mac and 3 Grade View: Grade I Tube type: Oral Tube size: 7.0 mm Number of attempts: 1 Airway Equipment and Method: Stylet and Oral airway Placement Confirmation: ETT inserted through vocal cords under direct vision,  positive ETCO2 and breath sounds checked- equal and bilateral Secured at: 20 cm Tube secured with: Tape Dental Injury: Teeth and Oropharynx as per pre-operative assessment

## 2018-02-28 NOTE — Transfer of Care (Signed)
Immediate Anesthesia Transfer of Care Note  Patient: Lori Montoya  Procedure(s) Performed: RIGHT BREAST LUMPECTOMY WITH RADIOACTIVE SEED LOCALIZATION ERAS PATHWAY (Right Breast) BILATERA BREAST REDUCTION FOR SYMMERTY (Bilateral Breast)  Patient Location: PACU  Anesthesia Type:General  Level of Consciousness: awake, alert  and oriented  Airway & Oxygen Therapy: Patient Spontanous Breathing and Patient connected to face mask oxygen  Post-op Assessment: Report given to RN and Post -op Vital signs reviewed and stable  Post vital signs: Reviewed and stable  Last Vitals:  Vitals Value Taken Time  BP 146/71 02/28/2018 12:04 PM  Temp    Pulse 90 02/28/2018 12:06 PM  Resp 7 02/28/2018 12:06 PM  SpO2 100 % 02/28/2018 12:06 PM  Vitals shown include unvalidated device data.  Last Pain:  Vitals:   02/28/18 0725  PainSc: 0-No pain         Complications: No apparent anesthesia complications

## 2018-02-28 NOTE — Anesthesia Preprocedure Evaluation (Addendum)
Anesthesia Evaluation  Patient identified by MRN, date of birth, ID band Patient awake    Reviewed: Allergy & Precautions, NPO status , Patient's Chart, lab work & pertinent test results  Airway Mallampati: III  TM Distance: >3 FB Neck ROM: Full    Dental no notable dental hx.    Pulmonary former smoker,    Pulmonary exam normal breath sounds clear to auscultation       Cardiovascular negative cardio ROS Normal cardiovascular exam Rhythm:Regular Rate:Normal     Neuro/Psych Seizures -, Well Controlled,  negative psych ROS   GI/Hepatic Neg liver ROS, GERD  Controlled,  Endo/Other  Hypothyroidism   Renal/GU negative Renal ROS     Musculoskeletal negative musculoskeletal ROS (+)   Abdominal   Peds  Hematology negative hematology ROS (+)   Anesthesia Other Findings right breast mass  Reproductive/Obstetrics                            Anesthesia Physical Anesthesia Plan  ASA: II  Anesthesia Plan: General   Post-op Pain Management:    Induction: Intravenous  PONV Risk Score and Plan: 3 and Scopolamine patch - Pre-op, Midazolam, Dexamethasone, Ondansetron and Treatment may vary due to age or medical condition  Airway Management Planned: LMA and Oral ETT  Additional Equipment:   Intra-op Plan:   Post-operative Plan: Extubation in OR  Informed Consent: I have reviewed the patients History and Physical, chart, labs and discussed the procedure including the risks, benefits and alternatives for the proposed anesthesia with the patient or authorized representative who has indicated his/her understanding and acceptance.   Dental advisory given  Plan Discussed with: CRNA  Anesthesia Plan Comments:         Anesthesia Quick Evaluation

## 2018-02-28 NOTE — H&P (Signed)
Lori Montoya Location: [facility] Patient #: [id number] DOB: 27-Jan-1951 Widowed / Language: [name] / Race: White Female  History of Present Illnes) Patient words: Patient sent at the request of the [facility] [place] for a right breast mammographic abnormality. In her right medial breast is an area of architectural distortion. Core biopsy was done which shows complex sclerosing lesion. Patient denies any history of breast pain nipple discharge or other problem with either breasts. She does note her left breast as well as been much larger than right. She's had no previous biopsies or surgeries.                      Possible distortion in the outer right breast anteriorly on a recent screening mammogram. EXAM: 2D DIGITAL DIAGNOSTIC RIGHT MAMMOGRAM WITH ADJUNCT TOMO ULTRASOUND RIGHT BREAST COMPARISON: Previous exam(s). ACR Breast Density Category c: The breast tissue is heterogeneously dense, which may obscure small masses. FINDINGS: 2D and 3D spot compression views of the right breast confirm an area of architectural distortion in the outer portion of the breast slightly inferiorly. On physical exam, no mass is palpable in the outer right breast or right axilla. Targeted ultrasound is performed, showing normal appearing breast tissue in the outer right breast. Also demonstrated are normal appearing right axillary lymph nodes. There is one lymph node with questionable eccentric cortical thickening superiorly. At real time, this appeared to be artifactual due to the configuration of the cortex. IMPRESSION: 1. Area of architectural distortion in the lower outer quadrant of the right breast with no sonographic correlate. 2. Right axillary lymph node with borderline eccentric cortical thickening. This most likely represents a normal lymph node. This will not require biopsy unless biopsy of the architectural distortion in the lower  outer right breast demonstrates malignancy. RECOMMENDATION: Stereotactic guided core needle biopsy of the area of architectural distortion in the lower outer quadrant of the right breast. This has been discussed with the patient and scheduled at 1:45 p.m. on 12/17/2017. I have discussed the findings and recommendations with the patient. Results were also provided in writing at the conclusion of the visit. If applicable, a reminder letter will be sent to the patient regarding the next appointment. BI-RADS CATEGORY 4: Suspicious. Electronically Signed By: Claudie Revering M.D. On: 12/12/2017 14:28                    Diagnosis Breast, right, needle core biopsy, LOQ - COMPLEX SCLEROSING LESION (CSL). SEE NOTE - NEGATIVE FOR CARCINOMA.  The patient is a 67 year old female.   Past Surgical History  Breast Biopsy Right. Gallbladder Surgery - Laparoscopic Oral Surgery  Diagnostic Studies History  Colonoscopy 1-5 years ago Mammogram within last year Pap Smear 1-5 years ago  Allergies  No Known Drug Allergies [01/03/2018]: Allergies Reconciled  Medication History  Levothyroxine Sodium (100MCG Tablet, Oral) Active. Synthroid (50MCG Tablet, Oral) Active. Vitamin D (1000UNIT Tablet, Oral) Active. Calcium Carbonate (1250 (500 Ca)MG Tablet, Oral) Active. Medications Reconciled  Social History  Alcohol use Remotely quit alcohol use. Caffeine use Carbonated beverages, Coffee, Tea. No drug use Tobacco use Former smoker.  Family History  Alcohol Abuse Brother, Father, Sister. Cancer Father. Cerebrovascular Accident Mother. Colon Polyps Father, Sister. Depression Sister. Migraine Headache Sister. Thyroid problems Mother.  Pregnancy / Birth History Age at menarche 37 years. Age of menopause 5-50 Gravida 2 Length (months) of breastfeeding 3-6 Maternal age 43-20 Para 1  Other Problems  Back  Pain Cholelithiasis Chronic  Obstructive Lung Disease Gastroesophageal Reflux Disease Hemorrhoids Hypercholesterolemia Lump In Breast Other disease, cancer, significant illness Seizure Disorder Thyroid Disease     Review of Systems  General Not Present- Appetite Loss, Chills, Fatigue, Fever, Night Sweats, Weight Gain and Weight Loss. Skin Not Present- Change in Wart/Mole, Dryness, Hives, Jaundice, New Lesions, Non-Healing Wounds, Rash and Ulcer. HEENT Not Present- Earache, Hearing Loss, Hoarseness, Nose Bleed, Oral Ulcers, Ringing in the Ears, Seasonal Allergies, Sinus Pain, Sore Throat, Visual Disturbances, Wears glasses/contact lenses and Yellow Eyes. Respiratory Present- Snoring. Not Present- Bloody sputum, Chronic Cough, Difficulty Breathing and Wheezing. Breast Present- Breast Mass. Not Present- Breast Pain, Nipple Discharge and Skin Changes. Cardiovascular Not Present- Chest Pain, Difficulty Breathing Lying Down, Leg Cramps, Palpitations, Rapid Heart Rate, Shortness of Breath and Swelling of Extremities. Gastrointestinal Not Present- Abdominal Pain, Bloating, Bloody Stool, Change in Bowel Habits, Chronic diarrhea, Constipation, Difficulty Swallowing, Excessive gas, Gets full quickly at meals, Hemorrhoids, Indigestion, Nausea, Rectal Pain and Vomiting. Female Genitourinary Not Present- Frequency, Nocturia, Painful Urination, Pelvic Pain and Urgency. Musculoskeletal Present- Back Pain. Not Present- Joint Pain, Joint Stiffness, Muscle Pain, Muscle Weakness and Swelling of Extremities. Neurological Not Present- Decreased Memory, Fainting, Headaches, Numbness, Seizures, Tingling, Tremor, Trouble walking and Weakness. Psychiatric Not Present- Anxiety, Bipolar, Change in Sleep Pattern, Depression, Fearful and Frequent crying. Endocrine Not Present- Cold Intolerance, Excessive Hunger, Hair Changes, Heat Intolerance, Hot flashes and New Diabetes. Hematology Not Present- Blood  Thinners, Easy Bruising, Excessive bleeding, Gland problems, HIV and Persistent Infections.  Vitals   Weight: 153.6 lb Height: 67in Body Surface Area: 1.81 m Body Mass Index: 24.06 kg/m  Temp.: 97.42F  Pulse: 84 (Regular)  BP: 155/85 (Sitting, Left Arm, Standard)      Physical Exam  General Mental Status-Alert. General Appearance-Consistent with stated age. Hydration-Well hydrated. Voice-Normal.  Head and Neck Head-normocephalic, atraumatic with no lesions or palpable masses. Trachea-midline. Thyroid Gland Characteristics - normal size and consistency.  Eye Eyeball - Bilateral-Extraocular movements intact. Sclera/Conjunctiva - Bilateral-No scleral icterus.  Breast Note: Left breast twice the size of right. No masses in either breast. No evidence of bruising from the biopsy. No discharge noted bilaterally.  Cardiovascular Cardiovascular examination reveals -normal heart sounds, regular rate and rhythm with no murmurs and normal pedal pulses bilaterally.  Neurologic Neurologic evaluation reveals -alert and oriented x 3 with no impairment of recent or remote memory. Mental Status-Normal.  Musculoskeletal Normal Exam - Left-Upper Extremity Strength Normal and Lower Extremity Strength Normal. Normal Exam - Right-Upper Extremity Strength Normal and Lower Extremity Strength Normal.  Lymphatic Head & Neck  General Head & Neck Lymphatics: Bilateral - Description - Normal. Axillary  General Axillary Region: Bilateral - Description - Normal. Tenderness - Non Tender.    Assessment & Plan   BREAST MASS, RIGHT (N63.10) Impression: Discussed small risk of malignancy in this setting. Recommend right breast C localized lumpectomy. She has significant asymmetry may benefit from contralateral left breast reduction and/or lift. She will have significant more asymmetry with lumpectomy. Discussed observation as well.  Refer to plastic surgery for consultation about possible left breast reduction versus lift. Risk of lumpectomy include bleeding, infection, seroma, more surgery, use of seed/wire, wound care, cosmetic deformity and the need for other treatments, death , blood clots, death. Pt agrees to proceed.  Current Plans Pt Education - [facility] Breast Biopsy HCI: discussed with patient and provided information. You are being scheduled for surgery- Our schedulers will call you.  You should hear from our office's scheduling department within 5  working days about the location, date, and time of surgery. We try to make accommodations for patient's preferences in scheduling surgery, but sometimes the OR schedule or the surgeon's schedule prevents [place] from making those accommodations.  If you have not heard from our office [phone]) in 5 working days, call the office and ask for your surgeon's nurse.  If you have other questions about your diagnosis, plan, or surgery, call the office and ask for your surgeon's nurse.   Macromastia to see plastics

## 2018-02-28 NOTE — Op Note (Signed)
Breast Reduction Op note:    DATE OF PROCEDURE: 02/28/2018  LOCATION: [facility]  SURGEON: Phillipe Clemon Sanger Monseratt Ledin, DO  ASSISTANT: Shawn Rayburn, PA  PREOPERATIVE DIAGNOSIS 1. Mass right breast 2. Macromastia 3. Neck Pain /  Back Pain  POSTOPERATIVE DIAGNOSIS 1. Mass right breast 2. Macromastia 3. Neck Pain /  Back Pain 4. Breast asymmetry  PROCEDURES 1. Bilateral breast reduction.  Right reduction 152g, Left reduction 628B  COMPLICATIONS: None.  DRAINS: none  INDICATIONS FOR PROCEDURE Karim is a 67 y.o. year-old female born on 09-22-2008,with a history of symptomatic macromastia with concominant back pain, neck pain, shoulder grooving from her bra.   MRN: [id number]  CONSENT Informed consent was obtained directly from the patient. The risks, benefits and alternatives were fully discussed. Specific risks including but not limited to bleeding, infection, hematoma, seroma, scarring, pain, nipple necrosis, asymmetry, poor cosmetic results, and need for further surgery were discussed. The patient had ample opportunity to have her questions answered to her satisfaction.  DESCRIPTION OF PROCEDURE  Patient was brought into the operating room and placed in a supine position.  SCDs were placed and appropriate padding was performed.  Antibiotics were given. The patient underwent general anesthesia and the chest was prepped and draped in a sterile fashion.  A timeout was performed and all information was confirmed to be correct.  Right side: Preoperative markings were confirmed.  Incision lines were injected with 1% Xylocaine with epinephrine.  General surgery performed the excision of the mass / seed.  The patient was than rendered to the plastic surgery team. The marked lines were incised.  A Wise-pattern superomedial breast reduction was performed by de-epithelializing the pedicle, using bovie to create the superomedial pedicle, and removing breast tissue  from the superior, lateral, and inferior portions of the breast.  Care was taken to not undermine the breast pedicle. Hemostasis was achieved.  The nipple was gently rotated into position and the soft tissue closed with 4-0 Monocryl.   The pocket was irrigated and hemostasis confirmed.  The deep tissues were approximated with 3-0 monocryl sutures and the skin was closed with deep dermal and subcuticular 4-0 Monocryl sutures.  The nipple and skin flaps had good capillary refill at the end of the procedure.    Left side: Preoperative markings were confirmed.  Incision lines were injected with 1% Xylocaine with epinephrine.  After waiting for vasoconstriction, the marked lines were incised.  A Wise-pattern superomedial breast reduction was performed by de-epithelializing the pedicle, using bovie to create the superomedial pedicle, and removing breast tissue from the superior, lateral, and inferior portions of the breast.  Care was taken to not undermine the breast pedicle. Hemostasis was achieved.  The nipple was gently rotated into position and the soft tissue was closed with 4-0 Monocryl.  The patient was sat upright and size and shape symmetry was confirmed.  The pocket was irrigated and hemostasis confirmed.  The deep tissues were approximated with 3-0 monocryl sutures and the skin was closed with deep dermal and subcuticular 4-0 Monocryl sutures.  Dermabond was applied.  A breast binder and ABDs were placed.  The nipple and skin flaps had good capillary refill at the end of the procedure.  The patient tolerated the procedure well. The patient was allowed to wake from anesthesia and taken to the recovery room in satisfactory condition. The advanced practice practitioner assisted throughout the case.  The APP was essential in retraction and counter traction when needed to make the  case progress smoothly.  The assistance was needed for blood control, tissue re-approximation and assisted with closure of the  incision site.

## 2018-03-01 ENCOUNTER — Encounter (HOSPITAL_BASED_OUTPATIENT_CLINIC_OR_DEPARTMENT_OTHER): Payer: Self-pay | Admitting: Surgery

## 2018-03-05 ENCOUNTER — Other Ambulatory Visit: Payer: Self-pay | Admitting: Family Medicine

## 2018-03-05 DIAGNOSIS — N6021 Fibroadenosis of right breast: Secondary | ICD-10-CM | POA: Insufficient documentation

## 2018-03-05 DIAGNOSIS — N6011 Diffuse cystic mastopathy of right breast: Secondary | ICD-10-CM | POA: Insufficient documentation

## 2018-03-05 DIAGNOSIS — N6012 Diffuse cystic mastopathy of left breast: Secondary | ICD-10-CM

## 2018-03-19 DIAGNOSIS — Z9889 Other specified postprocedural states: Secondary | ICD-10-CM | POA: Insufficient documentation

## 2018-05-13 ENCOUNTER — Ambulatory Visit: Payer: Medicare Other | Admitting: Family Medicine

## 2018-05-13 ENCOUNTER — Encounter: Payer: Self-pay | Admitting: Family Medicine

## 2018-05-13 VITALS — BP 119/76 | HR 87 | Ht 67.0 in | Wt 153.2 lb

## 2018-05-13 DIAGNOSIS — H6980 Other specified disorders of Eustachian tube, unspecified ear: Secondary | ICD-10-CM | POA: Diagnosis not present

## 2018-05-13 DIAGNOSIS — R748 Abnormal levels of other serum enzymes: Secondary | ICD-10-CM | POA: Diagnosis not present

## 2018-05-13 DIAGNOSIS — F17210 Nicotine dependence, cigarettes, uncomplicated: Secondary | ICD-10-CM

## 2018-05-13 DIAGNOSIS — E7800 Pure hypercholesterolemia, unspecified: Secondary | ICD-10-CM | POA: Insufficient documentation

## 2018-05-13 DIAGNOSIS — E781 Pure hyperglyceridemia: Secondary | ICD-10-CM | POA: Diagnosis not present

## 2018-05-13 DIAGNOSIS — I83893 Varicose veins of bilateral lower extremities with other complications: Secondary | ICD-10-CM | POA: Diagnosis not present

## 2018-05-13 DIAGNOSIS — E038 Other specified hypothyroidism: Secondary | ICD-10-CM | POA: Diagnosis not present

## 2018-05-13 DIAGNOSIS — H6990 Unspecified Eustachian tube disorder, unspecified ear: Secondary | ICD-10-CM | POA: Insufficient documentation

## 2018-05-13 NOTE — Progress Notes (Signed)
Impression and Recommendations:    1. Elevated LDL cholesterol level   2. Smoking greater than 30 pack years;   quit December 2015   3. Other specified hypothyroidism   4. Dysfunction of Eustachian tube, unspecified laterality   5. Hypertriglyceridemia   6. Varicose veins of both legs with edema   7. Elevated alkaline phosphatase level     1. Cholesterol - Patient is intolerant to statins and stopped Zetia in May of 2018.   - Strongly recommended medication to the patient today given her ASCVD risk.  However, patient declines at this time, wishing to evaluate her cholesterol on "Cholest Off."  - Patient has been taking "Cholest Off" OTC since February 2019, and wishes to have her cholesterol measured to see if there have been any changes.   - Patient agrees to resume Zetia if her ASCVD risk is 7.5% or greater on re-check.  The 10-year ASCVD risk score Mikey Bussing DC Brooke Bonito., et al., 2013) is: 10.5%   Values used to calculate the score:     Age: 17 years     Sex: Female     Is Non-Hispanic African American: No     Diabetic: No     Tobacco smoker: No     Systolic Blood Pressure: 947 mmHg     Is BP treated: No     HDL Cholesterol: 44 mg/dL     Total Cholesterol: 215 mg/dL  - If patient begins Zetia in the near future, she will return for blood work to evaluate progress.  - Decrease consumption of saturated and trans fats to reduce LDL, and decrease intake of fatty carbs.  - Patient may begin taking krill oil/omega 3 supplement to help aid reduction of LDL.  HTN - Discussed the importance of continued low blood pressure with her cholesterol. - Patient knows to monitor her blood pressure.  2. Hypothyroidism - No changes made to management today. - Continue treatment and synthroid as prescribed.  3. Vitamin D - No changes made to treatment today. - Continue supplementation as prescribed.  4. Eustachian Tube Dysfunction - Advised the patient to begin using AYR or Neilmed  sinus rinses BID followed by flonase BID (one spray to each nostril).  Advised that the patient may also incorporate allegra or claritin PRN.   5. Painful Varicose Veins- Pt will call [facility] for appt. Will let us know if she needs referral  6. Preventative Health Maintenance - Advised patient to continue working toward exercising to improve health.  Reminded patient that "if we don't use it, we lose it," with regards to her physical conditioning.  - Patient will begin with 15 minutes of activity daily.  Recommended that the patient eventually strive for at least 150 minutes of cardio per week according to the [facility].   - Healthy dietary habits encouraged, including low-carb, and high amounts of lean protein in diet.   - Patient should also consume adequate amounts of water - half of body weight in oz of water per day.  7. Follow-Up - Return for fasting lipid profile in the very near future.  - Will need re-check to evaluate progress on meds if restart.  If not, return as recommended.  - Patient is due in 09/2018 for next DEXA.  Orders Placed This Encounter  Procedures  . Lipid panel  . Comprehensive metabolic panel    No orders of the defined types were placed in this encounter.   Gross side effects,  risk and benefits, and alternatives of medications and treatment plan in general discussed with patient.  Patient is aware that all medications have potential side effects and we are unable to predict every side effect or drug-drug interaction that may occur.   Patient will call with any questions prior to using medication if they have concerns.  Expresses verbal understanding and consents to current therapy and treatment regimen.  No barriers to understanding were identified.  Red flag symptoms and signs discussed in detail.  Patient expressed understanding regarding what to do in case of emergency\urgent symptoms  Please see AVS handed out to patient at the end of our  visit for further patient instructions/ counseling done pertaining to today's office visit.   Return HLD, hypoTHyroid, gerd, for Will need recheck labs in 6 weeks if we start meds otherwise follow-up 4 months.    Note: This note was prepared with assistance of Dragon voice recognition software. Occasional wrong-word or sound-a-like substitutions may have occurred due to the inherent limitations of voice recognition software.   This document serves as a record of services personally performed by Mellody Dance, DO. It was created on her behalf by Toni Amend, a trained medical scribe. The creation of this record is based on the scribe's personal observations and the provider's statements to them.   I have reviewed the above medical documentation for accuracy and completeness and I concur.  Mellody Dance 05/19/18 10:02 PM   ---------------------------------------------------------------------------------------------------------------    Subjective:     HPI: Lori Montoya is a 67 y.o. female who presents to [facility] today for issues as discussed below.  Patient had elective breast reduction surgery with Dr. Marla Roe in April to even out her breast size after a R breast lumpectomy.  She is recovering well.  Health & Lifestyle Patient notes that she hasn't smoked a cigarette in over 3 years.  Specifies that she hasn't smoked a cigarette since December of 2015.   Per patient, she has been eating an apple every day and drinking beet juice daily.  However, she hasn't been exercising as much as she wants/knows she should.  Notes that she does walk with her son from time to time, who is a physical therapy assistant.  States "he's pretty good at making me move."  She does have a treadmill that she tries to walk on from time to time, and a stationary bike that she sometimes uses to avoid sitting still.  Painful Varicose Veins Patient reports painful  varicose veins on bilateral LE, worse on the right than the left, which have worsened in recent times.  States that these get especially painful and intolerable when she sits for a long time.    Hypothyroid Notes that her energy levels have been good.  Continues synthroid as prescribed.  Vitamin D Continues supplementation as prescribed.  Eustachian Tube Dysfunction Patient notes ongoing ringing in her ears, but does not wish to be evaluated by another doctor for this.  She admits that she does not use her Flonase daily.  1. 67 y.o. female here for cholesterol follow-up.   - Patient reports good compliance with medications or treatment plan  Patient tolerated Zetia well in the past, but stopped taking it because she "didn't want to be on pills."  Patient has been taking OTC medication called "Cholest Off" since February of 2019.  She would like to see how her cholesterol has changed while taking this dietary supplement for the past four  months.  Notes today that her mother had older onset strokes in her 56's, "right before she died."  - Denies medication S-E   - Smoking Status noted   - She denies new onset of: chest pain, exercise intolerance, shortness of breath, dizziness, visual changes, headache, lower extremity swelling or claudication.   Denies new myalgias.  The cholesterol last visit was:  Lab Results  Component Value Date   CHOL 215 (H) 05/16/2018   HDL 44 05/16/2018   LDLCALC 150 (H) 05/16/2018   TRIG 105 05/16/2018   CHOLHDL 4.9 (H) 05/16/2018    Hepatic Function Latest Ref Rng & Units 05/16/2018 09/17/2017 04/04/2017  Total Protein 6.0 - 8.5 g/dL 7.0 7.3 7.1  Albumin 3.6 - 4.8 g/dL 4.2 4.5 4.3  AST 0 - 40 IU/L 45(H) 40 35  ALT 0 - 32 IU/L 33(H) 28 25  Alk Phosphatase 39 - 117 IU/L 186(H) 161(H) 152(H)  Total Bilirubin 0.0 - 1.2 mg/dL 0.3 0.4 0.3  Bilirubin, Direct 0.00 - 0.40 mg/dL - - 0.10     Wt Readings from Last 3 Encounters:  05/13/18 153 lb 3.2 oz  (69.5 kg)  02/28/18 149 lb 9.6 oz (67.9 kg)  01/14/18 152 lb 8 oz (69.2 kg)   BP Readings from Last 3 Encounters:  05/13/18 119/76  02/28/18 (!) 143/70  01/14/18 136/79   Pulse Readings from Last 3 Encounters:  05/13/18 87  02/28/18 87  01/14/18 71   BMI Readings from Last 3 Encounters:  05/13/18 23.99 kg/m  02/28/18 23.43 kg/m  01/14/18 23.88 kg/m     Patient Care Team    Relationship Specialty Notifications Start End  Mellody Dance, DO PCP - General Family Medicine  09/12/16   Teena Irani, MD (Inactive) Consulting Physician Gastroenterology  09/12/16   Jerrell Belfast, MD Consulting Physician Otolaryngology  09/12/16   Darlin Coco, MD  Cardiology  09/12/16   Wallace Going, DO Attending Physician Plastic Surgery  01/15/18      Patient Active Problem List   Diagnosis Date Noted  . Smoking greater than 30 pack years 09/12/2016    Priority: High  . Hypothyroidism 09/12/2016    Priority: High  . Hyperlipidemia 09/12/2016    Priority: High  . COPD (chronic obstructive pulmonary disease) ? 09/12/2016    Priority: High  . GERD (gastroesophageal reflux disease) 09/12/2016    Priority: High  . Vitamin D insufficiency 11/03/2016    Priority: Medium  . Osteoporosis 09/12/2016    Priority: Low  . Elevated LDL cholesterol level 05/13/2018  . Dysfunction of eustachian tube 05/13/2018  . Hypertriglyceridemia 05/13/2018  . Varicose veins of both legs with edema 05/13/2018  . Status post breast reduction 03/19/2018  . Sclerosing adenosis of breast, right 03/05/2018  . Fibrocystic breast changes, bilateral 03/05/2018  . Elevated liver enzymes 09/17/2017  . Other fatigue 02/20/2017  . Myalgia 02/20/2017  . Degenerative disc disease, lumbar 09/12/2016  . Elevated alkaline phosphatase level 09/12/2016  . Rosacea 09/12/2016  . 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 Intermittent palpitations 07/20/2014  . Frequent unifocal PVCs 07/20/2014     Past Medical history, Surgical history, Family history, Social history, Allergies and Medications have been entered into the medical record, reviewed and changed as needed.    Current Meds  Medication Sig  . Calcium Carbonate ([employer] CALCIUM) 1500 MG TABS Take by mouth.  . Cholecalciferol (VITAMIN D3) 1000 units CAPS Take 5 capsules (5,000 Units total) by mouth daily.  Marland Kitchen  fluticasone (FLONASE) 50 MCG/ACT nasal spray 1 spray into each nostril twice daily after your sinus rinse  . levothyroxine (SYNTHROID, LEVOTHROID) 100 MCG tablet TAKE 1 TABLET BY MOUTH DAILY.  Marland Kitchen Plant Sterols and Stanols (CHOLEST OFF PO) Take 2 capsules by mouth daily.    Allergies:  Allergies  Allergen Reactions  . Alendronate Other (See Comments)    Bone pain     Review of Systems:  A fourteen system review of systems was performed and found to be positive as per HPI.   Objective:   Blood pressure 119/76, pulse 87, height '5\' 7"'  (1.702 m), weight 153 lb 3.2 oz (69.5 kg), SpO2 97 %. Body mass index is 23.99 kg/m. General:  Well Developed, well nourished, appropriate for stated age.  Neuro:  Alert and oriented,  extra-ocular muscles intact  HEENT:  Normocephalic, atraumatic, neck supple, no carotid bruits appreciated  Skin:  no gross rash, warm, pink. Cardiac:  RRR, S1 S2 Respiratory:  ECTA B/L and A/P, Not using accessory muscles, speaking in full sentences- unlabored. Vascular:  Ext warm, no cyanosis apprec.; cap RF less 2 sec. Two thickened veins down the medial aspect of her thighs, along the medial aspect of her lower legs.  The veins are enlarged with vascular engorgement and tender to touch. Psych:  No HI/SI, judgement and insight good, Euthymic mood. Full Affect.

## 2018-05-13 NOTE — Patient Instructions (Addendum)
Near future please come in for fasting lipid profile and a CMP so we can recheck your liver enzymes and others.       The quick and dirty--> lower triglyceride levels more through...  1) - Beware of bad fats: Cutting back on saturated fat (in red meat and full-fat dairy foods) and trans fats (in restaurant fried foods and commercially prepared baked goods) can lower triglycerides.  2) - Go for good carbs: Easily digested carbohydrates (such as white bread, white rice, cornflakes, and sugary sodas) give triglycerides a definite boost.   3) - Eating whole grains and cutting back on soda can help control triglycerides.  4) - Check your alcohol use. In some people, alcohol dramatically boosts triglycerides. The only way to know if this is true for you is to avoid alcohol for a few weeks and have your triglycerides tested again.  5) - Go fish. Omega-3 fats in salmon, tuna, sardines, and other fatty fish can lower triglycerides. Having fish twice a week is fine.  6) - Aim for a healthy weight. If you are overweight, losing just 5% to 10% of your weight can help drive down triglycerides.  7) - Get moving. Exercise lowers triglycerides and boosts heart-healthy HDL cholesterol.  8) - quit smoking if you do  --> for more information, see below; or go to  www.heart.org  and do a search for desired topics   For those diagnosed with high triglycerides, it's important to take action to lower your levels and improve your heart health.  Triglyceride is just a fancy word for fat - the fat in our bodies is stored in the form of triglycerides. Triglycerides are found in foods and manufactured in our bodies.  Normal triglyceride levels are defined as less than 150 mg/dL; 150 to 199 is considered borderline high; 200 to 499 is high; and 500 or higher is officially called very high. To me, anything over 150 is a red flag indicating my patient needs to take immediate steps to get the situation under  control.   What is the significance of high triglycerides? High triglyceride levels make blood thicker and stickier, which means that it is more likely to form clots. Studies have shown that triglyceride levels are associated with increased risks of cardiovascular disease and stroke - in both men and women - alone or in combination with other risk factors (high triglycerides combined with high LDL cholesterol can be a particularly deadly combination). For example, in one ground-breaking study, high triglycerides alone increased the risk of cardiovascular disease by 14 percent in men, and by 67 percent in women. But when the test subjects also had low HDL cholesterol (that's the good cholesterol) and other risk factors, high triglycerides increased the risk of disease by 32 percent in men and 76 percent in women.   Fortunately, triglycerides can sometimes be controlled with several diet and lifestyle changes.    What Factors Can Increase Triglycerides? As with cholesterol, eating too much of the wrong kinds of fats will raise your blood triglycerides.  Therefore, it's important to restrict the amounts of saturated fats and trans fats you allow into your diet.  Triglyceride levels can also shoot up after eating foods that are high in carbohydrates or after drinking alcohol.  That's why triglyceride blood tests require an overnight fast.  If you have elevated triglycerides, it's especially important to avoid sugary and refined carbohydrates, including sugar, honey, and other sweeteners, soda and other sugary drinks, candy, baked goods, and  anything made with white (refined or enriched) flour, including white bread, rolls, cereals, buns, pastries, regular pasta, and white rice.  You'll also want to limit dried fruit and fruit juice since they're dense in simple sugar.  All of these low-quality carbs cause a sudden rise in insulin, which may lead to a spike in triglycerides.  Triglycerides can also become  elevated as a reaction to having diabetes, hypothyroidism, or kidney disease. As with most other heart-related factors, being overweight and inactive also contribute to abnormal triglycerides. And unfortunately, some people have a genetic predisposition that causes them to manufacture way too much triglycerides on their own, no matter how carefully they eat.     How Can You Lower Your Triglyceride Levels? If you are diagnosed with high triglycerides, it's important to take action. There are several things you can do to help lower your triglyceride levels and improve your heart health:  --> Lose weight if you are overweight.  There is a clear correlation between obesity and high triglycerides - the heavier people are, the higher their triglyceride levels are likely to be. The good news is that losing weight can significantly lower triglycerides. In a large study of individuals with type 2 diabetes, those assigned to the "lifestyle intervention group" - which involved counseling, a low-calorie meal plan, and customized exercise program - lost 8.6% of their body weight and lowered their triglyceride levels by more than 16%. If you're overweight, find a weight loss plan that works for you and commit to shedding the pounds and getting healthier.  --> Reduce the amount of saturated fat and trans fat in your diet.  Start by avoiding or dramatically limiting butter, cream cheese, lard, sour cream, doughnuts, cakes, cookies, candy bars, regular ice cream, fried foods, pizza, cheese sauce, cream-based sauces and salad dressings, high-fat meats (including fatty hamburgers, bologna, pepperoni, sausage, bacon, salami, pastrami, spareribs, and hot dogs), high-fat cuts of beef and pork, and whole-milk dairy products.   Other ways to cut back: Choose lean meats only (including skinless chicken and [place], lean beef, lean pork), fish, and reduced-fat or fat-free dairy products.   Experiment with adding whole soy  foods to your diet. Although soy itself may not reduce risk of heart disease, it replaces hazardous animal fats with healthier proteins. Choose high-quality soy foods, such as tofu, tempeh, soy milk, and edamame (whole soybeans).  Always remove skin from poultry.  Prepare foods by baking, roasting, broiling, boiling, poaching, steaming, grilling, or stir-frying in vegetable oil.  Most stick margarines contain trans fats, and trans fats are also found in some packaged baked goods, potato chips, snack foods, fried foods, and fast food that use or create hydrogenated oils.    (All food labels must now list the amount of trans fats, right after the amount of saturated fats - good news for consumers. As a result, many food companies have now reformulated their products to be trans fat free.many, but not all! So it's still just as important to read labels and make sure the packaged foods you buy don't contain trans fats.)     If you use margarine, purchase soft-tub margarine spreads that contain 0 grams trans fats and don't list any partially hydrogenated oils in the ingredients list. By substituting olive oil or vegetable oil for trans fats in just 2 percent of your daily calories, you can reduce your risk of heart disease by 53 percent.   There is no safe amount of trans fats, so try to keep  them as far from your plate as possible.  -->  Avoid foods that are concentrated in sugar (even dried fruit and fruit juice). Sugary foods can elevate triglyceride levels in the blood, so keep them to a bare minimum.  --> Swap out refined carbohydrates for whole grains.  Refined carbohydrates - like white rice, regular pasta, and anything made with white or "enriched" flour (including white bread, rolls, cereals, buns, and crackers) - raise blood sugar and insulin levels more than fiber-rich whole grains. Higher insulin levels, in turn, can lead to a higher rise in triglycerides after a meal. So, make the switch to  whole wheat bread, whole grain pasta, brown or wild rice, and whole grain versions of cereals, crackers, and other bread products. However, it's important to know that individuals with high triglycerides should moderate even their intake of high-quality starches (since all starches raise blood sugar) - I recommend 1 to 2 servings per meal.  --> Cut way back on alcohol.  If you have high triglycerides, alcohol should be considered a rare treat - if you indulge at all, since even small amounts of alcohol can dramatically increase triglyceride levels.  --> Incorporate omega-3 fats.  Heart-healthy fish oils are especially rich in omega-3 fatty acids. In multiple studies over the past two decades, people who ate diets high in omega-3s had 30 to 40 percent reductions in heart disease. Although we don't yet know why fish oil works so well, there are several possibilities. Omega-3s seem to reduce inflammation, reduce high blood pressure, decrease triglycerides, raise HDL cholesterol, and make blood thinner and less sticky so it is less likely to clot. It's as close to a food prescription for heart health as it gets. If you have high triglycerides, I recommend eating at least three servings of one of the omega-3-rich fish every week (fatty fish is the most concentrated food form of omega three fats). If you cannot manage to eat that much fish, speak with your physician about taking fish oil capsules, which offer similar benefits.The best foods for omega-3 fatty acids include wild salmon (fresh, canned), herring, mackerel (not king), sardines, anchovies, rainbow trout, and Pacific oysters. Non-fish sources of omega-3 fats include omega-3-fortified eggs, ground flaxseed, chia seeds, walnuts, butternuts (white walnuts), seaweed, walnut oil, canola oil, and soybeans.  --> Quit smoking.  Smoking causes inflammation, not just in your lungs, but throughout your body. Inflammation can contribute to atherosclerosis, blood  clots, and risk of heart attack. Smoking makes all heart health indicators worse. If you have high cholesterol, high triglycerides, or high blood pressure, smoking magnifies the danger.  --> Become more physically active.  Even moderate exercise can help improve cholesterol, triglycerides, and blood pressure. Aerobic exercise seems to be able to stop the sharp rise of triglycerides after eating, perhaps because of a decrease in the amount of triglyceride released by the liver, or because active muscle clears triglycerides out of the blood stream more quickly than inactive muscle. If you haven't exercised regularly (or at all) for years, I recommend starting slowly, by walking at an easy pace for 15 minutes a day. Then, as you feel more comfortable, increase the amount. Your ultimate goal should be at least 30 minutes of moderate physical activity, at least five days a week.     Guidelines for a Low Cholesterol, Low Saturated Fat Diet   Fats - Limit total intake of fats and oils. - Avoid butter, stick margarine, shortening, lard, palm and coconut oils. - Limit mayonnaise,  salad dressings, gravies and sauces, unless they are homemade with low-fat ingredients. - Limit chocolate. - Choose low-fat and nonfat products, such as low-fat mayonnaise, low-fat or non-hydrogenated peanut butter, low-fat or fat-free salad dressings and nonfat gravy. - Use vegetable oil, such as canola or olive oil. - Look for margarine that does not contain trans fatty acids. - Use nuts in moderate amounts. - Read ingredient labels carefully to determine both amount and type of fat present in foods. Limit saturated and trans fats! - Avoid high-fat processed and convenience foods.  Meats and Meat Alternatives - Choose fish, chicken, [place] and lean meats. - Use dried beans, peas, lentils and tofu. - Limit egg yolks to three to four per week. - If you eat red meat, limit to no more than three servings per week and choose  loin or round cuts. - Avoid fatty meats, such as bacon, sausage, franks, luncheon meats and ribs. - Avoid all organ meats, including liver.  Dairy - Choose nonfat or low-fat milk, yogurt and cottage cheese. - Most cheeses are high in fat. Choose cheeses made from non-fat milk, such as mozzarella and ricotta cheese. - Choose light or fat-free cream cheese and sour cream. - Avoid cream and sauces made with cream.  Fruits and Vegetables - Eat a wide variety of fruits and vegetables. - Use lemon juice, vinegar or "mist" olive oil on vegetables. - Avoid adding sauces, fat or oil to vegetables.  Breads, Cereals and Grains - Choose whole-grain breads, cereals, pastas and rice. - Avoid high-fat snack foods, such as granola, cookies, pies, pastries, doughnuts and croissants.  Cooking Tips - Avoid deep fried foods. - Trim visible fat off meats and remove skin from poultry before cooking. - Bake, broil, boil, poach or roast poultry, fish and lean meats. - Drain and discard fat that drains out of meat as you cook it. - Add little or no fat to foods. - Use vegetable oil sprays to grease pans for cooking or baking. - Steam vegetables. - Use herbs or no-oil marinades to flavor foods.

## 2018-05-16 ENCOUNTER — Other Ambulatory Visit (INDEPENDENT_AMBULATORY_CARE_PROVIDER_SITE_OTHER): Payer: Medicare Other

## 2018-05-16 DIAGNOSIS — E7800 Pure hypercholesterolemia, unspecified: Secondary | ICD-10-CM

## 2018-05-16 DIAGNOSIS — R748 Abnormal levels of other serum enzymes: Secondary | ICD-10-CM | POA: Diagnosis not present

## 2018-05-16 DIAGNOSIS — E781 Pure hyperglyceridemia: Secondary | ICD-10-CM | POA: Diagnosis not present

## 2018-05-17 LAB — LIPID PANEL
CHOLESTEROL TOTAL: 215 mg/dL — AB (ref 100–199)
Chol/HDL Ratio: 4.9 ratio — ABNORMAL HIGH (ref 0.0–4.4)
HDL: 44 mg/dL (ref >39)
LDL Calculated: 150 mg/dL — ABNORMAL HIGH (ref 0–99)
Triglycerides: 105 mg/dL (ref 0–149)
VLDL CHOLESTEROL CAL: 21 mg/dL (ref 5–40)

## 2018-05-17 LAB — COMPREHENSIVE METABOLIC PANEL
ALK PHOS: 186 IU/L — AB (ref 39–117)
ALT: 33 IU/L — ABNORMAL HIGH (ref 0–32)
AST: 45 IU/L — AB (ref 0–40)
Albumin/Globulin Ratio: 1.5 (ref 1.2–2.2)
Albumin: 4.2 g/dL (ref 3.6–4.8)
BILIRUBIN TOTAL: 0.3 mg/dL (ref 0.0–1.2)
BUN/Creatinine Ratio: 12 (ref 12–28)
BUN: 10 mg/dL (ref 8–27)
CHLORIDE: 101 mmol/L (ref 96–106)
CO2: 26 mmol/L (ref 20–29)
CREATININE: 0.83 mg/dL (ref 0.57–1.00)
Calcium: 9.6 mg/dL (ref 8.7–10.3)
GFR calc Af Amer: 84 mL/min/{1.73_m2} (ref >59)
GFR calc non Af Amer: 73 mL/min/{1.73_m2} (ref >59)
GLOBULIN, TOTAL: 2.8 g/dL (ref 1.5–4.5)
GLUCOSE: 83 mg/dL (ref 65–99)
POTASSIUM: 4.5 mmol/L (ref 3.5–5.2)
Sodium: 140 mmol/L (ref 134–144)
Total Protein: 7 g/dL (ref 6.0–8.5)

## 2018-07-04 ENCOUNTER — Other Ambulatory Visit: Payer: Medicare Other

## 2018-07-04 ENCOUNTER — Ambulatory Visit: Payer: Medicare Other | Admitting: Family Medicine

## 2018-08-20 ENCOUNTER — Telehealth: Payer: Self-pay | Admitting: Family Medicine

## 2018-08-20 NOTE — Telephone Encounter (Signed)
Chaelyn stopped by office to tell Dr. Jenetta Downer ( sister /Ms. Lurline Hare ) is in hospital w/ Sepsis-- she had discussed her concern regarding Ms. Farris behavior during an OV w/Dr. Raliegh Scarlet & just wanted Dr.O to know (pt's family thought irregular behavior was due to pt taking lots of Rx meds, but is was because of the sepsis infection that pt was acting they was she was.  --Pt now in hospital being treated.   --forwarding message to medical assistant to share w/ provider. --glh

## 2018-08-27 ENCOUNTER — Ambulatory Visit (INDEPENDENT_AMBULATORY_CARE_PROVIDER_SITE_OTHER): Payer: Medicare Other

## 2018-08-27 VITALS — BP 151/84 | HR 76 | Temp 97.6°F

## 2018-08-27 DIAGNOSIS — Z23 Encounter for immunization: Secondary | ICD-10-CM | POA: Diagnosis not present

## 2018-08-27 NOTE — Progress Notes (Signed)
Patient is here for flu vaccine.  Patient tolerated injection well. MPulliam, CMA/RT(R)  

## 2018-09-05 ENCOUNTER — Other Ambulatory Visit: Payer: Self-pay | Admitting: Family Medicine

## 2018-09-13 ENCOUNTER — Ambulatory Visit (INDEPENDENT_AMBULATORY_CARE_PROVIDER_SITE_OTHER): Payer: Medicare Other | Admitting: Family Medicine

## 2018-09-13 ENCOUNTER — Encounter: Payer: Self-pay | Admitting: Family Medicine

## 2018-09-13 VITALS — BP 138/84 | HR 72 | Ht 67.0 in | Wt 151.8 lb

## 2018-09-13 DIAGNOSIS — K219 Gastro-esophageal reflux disease without esophagitis: Secondary | ICD-10-CM

## 2018-09-13 DIAGNOSIS — E7800 Pure hypercholesterolemia, unspecified: Secondary | ICD-10-CM | POA: Diagnosis not present

## 2018-09-13 DIAGNOSIS — E7849 Other hyperlipidemia: Secondary | ICD-10-CM | POA: Diagnosis not present

## 2018-09-13 DIAGNOSIS — E781 Pure hyperglyceridemia: Secondary | ICD-10-CM | POA: Diagnosis not present

## 2018-09-13 DIAGNOSIS — F17210 Nicotine dependence, cigarettes, uncomplicated: Secondary | ICD-10-CM

## 2018-09-13 DIAGNOSIS — E038 Other specified hypothyroidism: Secondary | ICD-10-CM

## 2018-09-13 DIAGNOSIS — R748 Abnormal levels of other serum enzymes: Secondary | ICD-10-CM

## 2018-09-13 NOTE — Progress Notes (Signed)
Impression and Recommendations:    1. Other hyperlipidemia   2. Elevated LDL cholesterol level   3. Hypertriglyceridemia   4. Elevated liver enzymes   5. Other specified hypothyroidism   6. Smoking greater than 30 pack years;   quit December 2015   7. Gastroesophageal reflux disease without esophagitis      1. Hyperlipidemia - Patient is intolerant to statins and stopped Zetia in May of 2018.  - Per pt, last time she tried Zetia, she experienced some dizzy spells and discontinued use.  Although she it does not seem sure it was from that medicine - Pt agrees to try Zetia again if needed, and wean her dose up slowly, although as I discussed with her I do not think that is the best situation for her if she did not tolerate it in the past  - Management on medication has been strongly recommended to the patient several times.  Patient agrees for re-evaluation of her cholesterol through cardiology if needed.  - Decrease consumption of saturated and trans fats to reduce LDL, and decrease intake of fatty carbs.  - Patient may begin taking krill oil/omega 3 supplement to help aid reduction of LDL. - Discussed that patient may also begin using red yeast rice if desired, with monitoring of liver enzymes.  2. Hypothyroidism - Stable at this time. - Continue treatment plan as prescribed.  See med list below. - Patient tolerating meds well without complication.  Denies S-E  3. GERD - Stable at this time. - Encouraged patient to begin using Zantac/Ranitidine, as written.  See med list today. - Strongly emphasized the importance of avoiding trigger foods.  4. Blood Pressure - Per pt, blood pressure usually runs around 124/75, with "some highs." - Ambulatory monitoring encouraged to assess for white coat syndrome.  Keep log and bring in next OV. - Goal reviewed as 120's/70's, occasional low 130's/80's.  - Engaging in a regular exercise program discussed with patient.  5. Lifestyle  & Preventative Health Maintenance - Advised patient to continue working toward exercising to improve overall mental, physical, and emotional health.    American Heart Association guidelines for healthy diet, basically Mediterranean diet, and exercise guidelines of 30 minutes 5 days per week or more discussed in detail.  Health counseling performed.  All questions answered.   - Encouraged patient to engage in daily physical activity, especially a formal exercise routine.  Recommended that the patient eventually strive for at least 150 minutes of moderate cardiovascular activity per week according to guidelines established by the [facility].   - Healthy dietary habits encouraged, including low-carb, and high amounts of lean protein in diet.   - Patient should also consume adequate amounts of water - half of body weight in oz of water per day.  6. Follow-Up - Patient is fasting today.  Fasting lab work drawn today. - If cholesterol is still bad, next steps will be evaluated.  - Prescriptions refilled today PRN.  - Otherwise, continue to return for CPE and chronic follow-up as scheduled. - Patient knows to call in sooner if desired to address acute concerns.   Orders Placed This Encounter  Procedures  . Comprehensive metabolic panel  . Lipid panel    Gross side effects, risk and benefits, and alternatives of medications and treatment plan in general discussed with patient.  Patient is aware that all medications have potential side effects and we are unable to predict every side effect or drug-drug interaction that may  occur.   Patient will call with any questions prior to using medication if they have concerns.    Expresses verbal understanding and consents to current therapy and treatment regimen.  No barriers to understanding were identified.  Red flag symptoms and signs discussed in detail.  Patient expressed understanding regarding what to do in case of emergency\urgent symptoms  Please see  AVS handed out to patient at the end of our visit for further patient instructions/ counseling done pertaining to today's office visit.   Return for 10mof/up HLD, Thyroid, revlux, stress.     Note:  This note was prepared with assistance of Dragon voice recognition software. Occasional wrong-word or sound-a-like substitutions may have occurred due to the inherent limitations of voice recognition software.  This document serves as a record of services personally performed by DMellody Dance DO. It was created on her behalf by KToni Amend a trained medical scribe. The creation of this record is based on the scribe's personal observations and the provider's statements to them.   I have reviewed the above medical documentation for accuracy and completeness and I concur.  DMellody Dance10/29/19 12:42 PM    ----------------------------------------------------------------------------------------------------------------------------------    Subjective:     HPI: KGERARDO TERRITOis a 67y.o. female [facility].  Patient states she checks her blood pressure regularly at home, and it usually runs around 124/75. States that sometimes it runs high, but it "usually runs low."  Patient continues to obtain mammograms yearly. Had her breast surgery in April.  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  Emotionally, states she's doing okay otherwise.  "If it gets bad, I'll just have to say 'look, you've gotta go.'"  Patient does not wish to be managed on medicine for stress or anxiety long-term.  Exercise Habits Patient has not been exercising lately. Has been moving furniture around to accommodate her new resident.  Notes she's lost weight due to the stress and physical labor associated with  this transition.  Hypothyroidism Continues on synthroid as prescribed.  GERD - Reflux Takes a Tums once in a while if she eats a piece of chocolate.  Otherwise, notes that her reflux has been good.  1. 67y.o. female here for cholesterol follow-up.   - Patient has intolerance to statins.  - Patient states that the last time she tried Zetia, she started having dizzy spells.   - Smoking Status noted.  - She denies new onset of: chest pain, exercise intolerance, shortness of breath, dizziness, visual changes, headache, lower extremity swelling or claudication.   Denies myalgias.  The cholesterol last visit was:  Lab Results  Component Value Date   CHOL 258 (H) 09/13/2018   HDL 51 09/13/2018   LDLCALC 183 (H) 09/13/2018   TRIG 121 09/13/2018   CHOLHDL 5.1 (H) 09/13/2018    Hepatic Function Latest Ref Rng & Units 09/13/2018 05/16/2018 09/17/2017  Total Protein 6.0 - 8.5 g/dL 7.4 7.0 7.3  Albumin 3.6 - 4.8 g/dL 4.5 4.2 4.5  AST 0 - 40 IU/L 37 45(H) 40  ALT 0 - 32 IU/L 25 33(H) 28  Alk Phosphatase 39 - 117 IU/L 141(H) 186(H) 161(H)  Total Bilirubin 0.0 - 1.2 mg/dL 0.4 0.3 0.4  Bilirubin, Direct 0.00 - 0.40 mg/dL - - -     Wt  Readings from Last 3 Encounters:  09/13/18 151 lb 12.8 oz (68.9 kg)  05/13/18 153 lb 3.2 oz (69.5 kg)  02/28/18 149 lb 9.6 oz (67.9 kg)   BP Readings from Last 3 Encounters:  09/13/18 138/84  08/27/18 (!) 151/84  05/13/18 119/76   Pulse Readings from Last 3 Encounters:  09/13/18 72  08/27/18 76  05/13/18 87   BMI Readings from Last 3 Encounters:  09/13/18 23.78 kg/m  05/13/18 23.99 kg/m  02/28/18 23.43 kg/m     Patient Care Team    Relationship Specialty Notifications Start End  Mellody Dance, DO PCP - General Family Medicine  09/12/16   Teena Irani, MD (Inactive) Consulting Physician Gastroenterology  09/12/16   Jerrell Belfast, MD Consulting Physician Otolaryngology  09/12/16   Darlin Coco, MD  Cardiology  09/12/16     Wallace Going, DO Attending Physician Plastic Surgery  01/15/18      Patient Active Problem List   Diagnosis Date Noted  . Smoking greater than 30 pack years 09/12/2016    Priority: High  . Hypothyroidism 09/12/2016    Priority: High  . Hyperlipidemia 09/12/2016    Priority: High  . COPD (chronic obstructive pulmonary disease) ? 09/12/2016    Priority: High  . GERD (gastroesophageal reflux disease) 09/12/2016    Priority: High  . Vitamin D insufficiency 11/03/2016    Priority: Medium  . Osteoporosis 09/12/2016    Priority: Low  . Gastroesophageal reflux disease without esophagitis 09/13/2018  . Elevated LDL cholesterol level 05/13/2018  . Dysfunction of eustachian tube 05/13/2018  . Hypertriglyceridemia 05/13/2018  . Varicose veins of both legs with edema 05/13/2018  . Status post breast reduction 03/19/2018  . Sclerosing adenosis of breast, right 03/05/2018  . Fibrocystic breast changes, bilateral 03/05/2018  . Elevated liver enzymes 09/17/2017  . Other fatigue 02/20/2017  . Myalgia 02/20/2017  . Degenerative disc disease, lumbar 09/12/2016  . Elevated alkaline phosphatase level 09/12/2016  . Rosacea 09/12/2016  . 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 Intermittent palpitations 07/20/2014  . Frequent unifocal PVCs 07/20/2014    Past Medical history, Surgical history, Family history, Social history, Allergies and Medications have been entered into the medical record, reviewed and changed as needed.    Current Meds  Medication Sig  . Calcium Carbonate ([employer] CALCIUM) 1500 MG TABS Take by mouth.  . Cholecalciferol (VITAMIN D3) 1000 units CAPS Take 5 capsules (5,000 Units total) by mouth daily.  . fluticasone (FLONASE) 50 MCG/ACT nasal spray 1 spray into each nostril twice daily after your sinus rinse  . levothyroxine (SYNTHROID, LEVOTHROID) 100 MCG tablet TAKE 1 TABLET BY MOUTH DAILY.  Marland Kitchen Plant Sterols and Stanols (CHOLEST OFF PO) Take 2 capsules by  mouth daily.    Allergies:  Allergies  Allergen Reactions  . Alendronate Other (See Comments)    Bone pain     Review of Systems:  A fourteen system review of systems was performed and found to be positive as per HPI.   Objective:   Blood pressure 138/84, pulse 72, height _0  (1.702 m), weight 151 lb 12.8 oz (68.9 kg), SpO2 99 %. Body mass index is 23.78 kg/m. General:  Well Developed, well nourished, appropriate for stated age.  Neuro:  Alert and oriented,  extra-ocular muscles intact  HEENT:  Normocephalic, atraumatic, neck supple, no carotid bruits appreciated  Skin:  no gross rash, warm, pink. Cardiac:  RRR, S1 S2 Respiratory:  ECTA B/L and A/P, Not using accessory  muscles, speaking in full sentences- unlabored. Vascular:  Ext warm, no cyanosis apprec.; cap RF less 2 sec. Psych:  No HI/SI, judgement and insight good, Euthymic mood. Full Affect.

## 2018-09-13 NOTE — Patient Instructions (Signed)

## 2018-09-14 LAB — LIPID PANEL
CHOL/HDL RATIO: 5.1 ratio — AB (ref 0.0–4.4)
Cholesterol, Total: 258 mg/dL — ABNORMAL HIGH (ref 100–199)
HDL: 51 mg/dL (ref >39)
LDL Calculated: 183 mg/dL — ABNORMAL HIGH (ref 0–99)
Triglycerides: 121 mg/dL (ref 0–149)
VLDL CHOLESTEROL CAL: 24 mg/dL (ref 5–40)

## 2018-09-14 LAB — COMPREHENSIVE METABOLIC PANEL
ALT: 25 IU/L (ref 0–32)
AST: 37 IU/L (ref 0–40)
Albumin/Globulin Ratio: 1.6 (ref 1.2–2.2)
Albumin: 4.5 g/dL (ref 3.6–4.8)
Alkaline Phosphatase: 141 IU/L — ABNORMAL HIGH (ref 39–117)
BUN/Creatinine Ratio: 15 (ref 12–28)
BUN: 12 mg/dL (ref 8–27)
Bilirubin Total: 0.4 mg/dL (ref 0.0–1.2)
CALCIUM: 9.8 mg/dL (ref 8.7–10.3)
CO2: 24 mmol/L (ref 20–29)
Chloride: 103 mmol/L (ref 96–106)
Creatinine, Ser: 0.82 mg/dL (ref 0.57–1.00)
GFR, EST AFRICAN AMERICAN: 86 mL/min/{1.73_m2} (ref >59)
GFR, EST NON AFRICAN AMERICAN: 74 mL/min/{1.73_m2} (ref >59)
GLUCOSE: 83 mg/dL (ref 65–99)
Globulin, Total: 2.9 g/dL (ref 1.5–4.5)
Potassium: 4.6 mmol/L (ref 3.5–5.2)
Sodium: 142 mmol/L (ref 134–144)
TOTAL PROTEIN: 7.4 g/dL (ref 6.0–8.5)

## 2018-09-18 ENCOUNTER — Telehealth: Payer: Self-pay | Admitting: Family Medicine

## 2018-09-18 ENCOUNTER — Other Ambulatory Visit: Payer: Self-pay | Admitting: Family Medicine

## 2018-09-18 MED ORDER — EZETIMIBE 10 MG PO TABS: 10.0000 mg | ORAL_TABLET | Freq: Every day | ORAL | 1 refills | Status: DC

## 2018-09-18 NOTE — Telephone Encounter (Signed)
Patient was advised on lab results that she should start taking Zetia by Dr. Jenetta Downer. Patient informed PCP that she had a prescription already at home for this med but upon finding it realized it was over a year old so she is worried about its over al effectiveness and is requesting a new order for this med to be sent to [facility]. Please advise if this appropriate.

## 2018-09-18 NOTE — Telephone Encounter (Signed)
I sent it already; thnx!

## 2018-09-18 NOTE — Telephone Encounter (Signed)
Yes, she told me she did not need a script. I agree, should have new one.  - please RF for 90d with 1 RF at same dose as what we prior dispensed to pt.

## 2018-09-25 ENCOUNTER — Encounter: Payer: Self-pay | Admitting: Family Medicine

## 2018-09-25 ENCOUNTER — Ambulatory Visit: Payer: Medicare Other | Admitting: Family Medicine

## 2018-09-25 VITALS — BP 144/87 | HR 91 | Temp 98.0°F | Ht 67.0 in | Wt 153.4 lb

## 2018-09-25 DIAGNOSIS — H6591 Unspecified nonsuppurative otitis media, right ear: Secondary | ICD-10-CM

## 2018-09-25 DIAGNOSIS — J019 Acute sinusitis, unspecified: Secondary | ICD-10-CM

## 2018-09-25 MED ORDER — AMOXICILLIN 500 MG PO CAPS: 1000.0000 mg | ORAL_CAPSULE | Freq: Three times a day (TID) | ORAL | 0 refills | Status: AC

## 2018-09-25 NOTE — Progress Notes (Signed)
Acute Care Office visit  Assessment and plan:  1. Right otitis media with effusion   2. Acute sinusitis, recurrence not specified, unspecified location     . Infection - Viral vs Allergic vs Bacterial causes for pt's symptoms reviewed.    - Supportive care and various OTC medications discussed in addition to any prescribed. - Continue to use chloraseptic spray and lozenges PRN and as recommended. - Drink plenty of water to help thin mucus.  - Advised the patient to begin using AYR or Neilmed sinus rinses BID followed by flonase BID (one spray to each nostril). Advised that the patient may also incorporate allegra or claritin PRN.   - Antibiotics prescribed today.  See med list today. - Reviewed prudent antibiotic use practices with patient today.  . Follow-Up - Call or RTC if new symptoms, or if no improvement or worse over next several days.    Meds ordered this encounter  Medications  . amoxicillin (AMOXIL) 500 MG capsule    Sig: Take 2 capsules (1,000 mg total) by mouth 3 (three) times daily for 10 days.    Dispense:  60 capsule    Refill:  0    Gross side effects, risk and benefits, and alternatives of medications discussed with patient.  Patient is aware that all medications have potential side effects and we are unable to predict every sideeffect or drug-drug interaction that may occur.  Expresses verbal understanding and consents to current therapy plan and treatment regiment.   Education and routine counseling performed. Handouts provided.  Anticipatory guidance and routine counseling done re: condition, txmnt options and need for follow up. All questions of patient's were answered.  Return if symptoms worsen or fail to improve, for f/up chronic care as previously discussed.  Please see AVS handed out to patient at the end of our visit for additional patient instructions/ counseling done pertaining to today's office visit.  Note:  This document was partially  repared using Dragon voice recognition software and may include unintentional dictation errors.  This document serves as a record of services personally performed by Mellody Dance, DO. It was created on her behalf by Toni Amend, a trained medical scribe. The creation of this record is based on the scribe's personal observations and the provider's statements to them.   I have reviewed the above medical documentation for accuracy and completeness and I concur.  Mellody Dance, D.O.     Subjective:    Chief Complaint  Patient presents with  . Sinus Problem    HPI:  Pt presents with Sx for 3 days  C/o:  States that two days ago, she came down with a bad right-sided headache. In the middle of the day yesterday, her nose started running on one side. Then her "throat got raw."  States today that her throat "feels like fire."  Notes that her right ear hurts, and her teeth hurt in the right side of her jaw.  Denies:  Chills or fevers.  Denies body aches or feeling achy all over.    For symptoms patient has tried:  Has taken cough drops and throat spray.  Overall getting:  States "I just feel like crap."    Patient Care Team    Relationship Specialty Notifications Start End  Mellody Dance, DO PCP - General Family Medicine  09/12/16   Teena Irani, MD (Inactive) Consulting Physician Gastroenterology  09/12/16   Jerrell Belfast, MD Consulting Physician Otolaryngology  09/12/16   Darlin Coco, MD  Cardiology  09/12/16   DillinghamLoel Lofty, DO Attending Physician Plastic Surgery  01/15/18     Past medical history, Surgical history, Family history reviewed and noted below, Social history, Allergies, and Medications have been entered into the medical record, reviewed and changed as needed.   Allergies  Allergen Reactions  . Alendronate Other (See Comments)    Bone pain    Review of Systems: - see above HPI for pertinent positives General:   No F/C, wt  loss Pulm:   No DIB, pleuritic chest pain Card:  No CP, palpitations Abd:  No n/v/d or pain Ext:  No inc edema from baseline   Objective:   Blood pressure (!) 144/87, pulse 91, temperature 98 F (36.7 C), height 5\' 7"  (1.702 m), weight 153 lb 6.4 oz (69.6 kg), SpO2 100 %. Body mass index is 24.03 kg/m. General: Well Developed, well nourished, appropriate for stated age.  Neuro: Alert and oriented x3, extra-ocular muscles intact, sensation grossly intact.  HEENT: Normocephalic, atraumatic, pupils equal round reactive to light, neck supple, no masses, no painful lymphadenopathy, TM's intact B/L, Right TM is very retracted and erythematous with evidence of air-fluid levels. Nares- patent, clear d/c, OP- clear, mild erythema, No TTP sinuses Skin: Warm and dry, no gross rash. Cardiac: RRR, S1 S2,  no murmurs rubs or gallops.  Respiratory: ECTA B/L and A/P, Not using accessory muscles, speaking in full sentences- unlabored. Vascular:  No gross lower ext edema, cap RF less 2 sec. Psych: No HI/SI, judgement and insight good, Euthymic mood. Full Affect.

## 2018-09-25 NOTE — Patient Instructions (Signed)

## 2018-10-29 ENCOUNTER — Ambulatory Visit: Payer: Medicare Other | Admitting: Adult Health

## 2018-10-29 ENCOUNTER — Encounter: Payer: Self-pay | Admitting: Adult Health

## 2018-10-29 VITALS — BP 130/74 | HR 77 | Temp 98.6°F | Ht 67.0 in | Wt 153.5 lb

## 2018-10-29 DIAGNOSIS — J029 Acute pharyngitis, unspecified: Secondary | ICD-10-CM | POA: Diagnosis not present

## 2018-10-29 LAB — POCT RAPID STREP A (OFFICE): RAPID STREP A SCREEN: NEGATIVE

## 2018-10-29 NOTE — Progress Notes (Signed)
Subjective:    Patient ID: Lori Montoya, female    DOB: Apr 20, 1951, 67 y.o.   MRN: [id number]  HPI:  Lori Montoya presents with sore throat that started 48 hrs ago. Sore throat is constant, described as "burning" rated 7/10, pain worsens with swallowing. She also reports mild, constant frontal HA 3/10 She denies nasal drainage/cough/fever/night sweats/chills/N/V/D/CP/dyspnea/dizziness/palpitations. She continues to abstain from tobacco/vape use She recently completely course of amoxicillin for R otitis media with effusion, rx provided on 09/25/18 She has been increasing fluids and continues to abstain from tobacco/vape use  Patient Care Team    Relationship Specialty Notifications Start End  Lori Dance, DO PCP - General Family Medicine  09/12/16   Lori Irani, MD (Inactive) Consulting Physician Gastroenterology  09/12/16   Lori Belfast, MD Consulting Physician Otolaryngology  09/12/16   Lori Coco, MD  Cardiology  09/12/16   Lori Going, DO Attending Physician Plastic Surgery  01/15/18     Patient Active Problem List   Diagnosis Date Noted  . Sore throat 10/29/2018  . Gastroesophageal reflux disease without esophagitis 09/13/2018  . Elevated LDL cholesterol level 05/13/2018  . Dysfunction of eustachian tube 05/13/2018  . Hypertriglyceridemia 05/13/2018  . Varicose veins of both legs with edema 05/13/2018  . Status post breast reduction 03/19/2018  . Sclerosing adenosis of breast, right 03/05/2018  . Fibrocystic breast changes, bilateral 03/05/2018  . Elevated liver enzymes 09/17/2017  . Other fatigue 02/20/2017  . Myalgia 02/20/2017  . Vitamin D insufficiency 11/03/2016  . Smoking greater than 30 pack years 09/12/2016  . Hypothyroidism 09/12/2016  . Hyperlipidemia 09/12/2016  . COPD (chronic obstructive pulmonary disease) ? 09/12/2016  . Degenerative disc disease, lumbar 09/12/2016  . GERD (gastroesophageal reflux disease) 09/12/2016  . Osteoporosis  09/12/2016  . Elevated alkaline phosphatase level 09/12/2016  . Rosacea 09/12/2016  . 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 Intermittent palpitations 07/20/2014  . 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 Elevated alkaline phosphatase level   . GERD (gastroesophageal reflux disease)   . Hyperlipidemia   . Osteoporosis   . Other and unspecified disc disorder of lumbar region   . Personal history of other disorders of nervous system and sense organs   . Seizures ([place])    last seizure in 1977  . Tobacco use disorder   . Unspecified hypothyroidism      Past Surgical History:  Procedure Laterality Date  . BREAST LUMPECTOMY WITH RADIOACTIVE SEED LOCALIZATION Right 02/28/2018   Procedure: RIGHT BREAST LUMPECTOMY WITH RADIOACTIVE SEED LOCALIZATION ERAS PATHWAY;  Surgeon: Lori Luna, MD;  Location: [facility];  Service: General;  Laterality: Right;  . BREAST REDUCTION SURGERY Bilateral 02/28/2018   Procedure: BILATERA BREAST REDUCTION FOR SYMMERTY;  Surgeon: Lori Going, DO;  Location: [place];  Service: Plastics;  Laterality: Bilateral;  . CHOLECYSTECTOMY  2011   lap choli  . COLONOSCOPY    . MICROLARYNGOSCOPY Left 01/01/2015   Procedure: MICROLARYNGOSCOPY WITH EXCISION OF LEFT VOCAL CORD NODULE ;  Surgeon: Lori Belfast, MD;  Location: [facility];  Service: ENT;  Laterality: Left;     Family History  Problem Relation Age of Onset  . Hyperlipidemia Mother   . Hypertension Mother   . CVA Mother   . Alcohol abuse Mother   . Dementia Mother   . Hypothyroidism Mother   . Cancer - Other Father  liver  . Alcohol abuse Brother   . ALS Brother   . Dementia Maternal Grandmother   . Stroke Maternal Grandfather   . Breast cancer Paternal Aunt        unsure of age  . Breast cancer Paternal Aunt        unsure of age     Social History   Substance and Sexual  Activity  Drug Use No     Social History   Substance and Sexual Activity  Alcohol Use No     Social History   Tobacco Use  Smoking Status Former Smoker  . Packs/day: 1.00  . Years: 48.00  . Pack years: 48.00  . Types: Cigarettes  . Last attempt to quit: 10/31/2014  . Years since quitting: 3.9  Smokeless Tobacco Never Used  Tobacco Comment   havent smoked in three weeks     Outpatient Encounter Medications as of 10/29/2018  Medication Sig  . Calcium Carbonate ([employer] CALCIUM) 1500 MG TABS Take by mouth.  . Cholecalciferol (VITAMIN D3) 1000 units CAPS Take 5 capsules (5,000 Units total) by mouth daily.  Marland Kitchen ezetimibe (ZETIA) 10 MG tablet Take 1 tablet (10 mg total) by mouth at bedtime.  . fluticasone (FLONASE) 50 MCG/ACT nasal spray 1 spray into each nostril twice daily after your sinus rinse  . levothyroxine (SYNTHROID, LEVOTHROID) 100 MCG tablet TAKE 1 TABLET BY MOUTH DAILY.  . [DISCONTINUED] Plant Sterols and Stanols (CHOLEST OFF PO) Take 2 capsules by mouth daily.   No facility-administered encounter medications on file as of 10/29/2018.     Allergies: Alendronate  Body mass index is 24.04 kg/m.  Blood pressure 130/74, pulse 77, temperature 98.6 F (37 C), temperature source Oral, height 5\' 7"  (1.702 m), weight 153 lb 8 oz (69.6 kg), SpO2 98 %.  Review of Systems  Constitutional: Positive for fatigue. Negative for activity change, appetite change, chills, diaphoresis, fever and unexpected weight change.  HENT: Positive for postnasal drip, sinus pressure, sore throat and voice change. Negative for congestion, ear discharge and ear pain.   Eyes: Negative for visual disturbance.  Respiratory: Negative for cough, chest tightness, shortness of breath, wheezing and stridor.   Cardiovascular: Negative for chest pain, palpitations and leg swelling.  Gastrointestinal: Negative for abdominal pain, blood in stool, constipation, diarrhea, nausea and vomiting.  Endocrine:  Negative for cold intolerance, heat intolerance, polydipsia, polyphagia and polyuria.  Neurological: Positive for headaches. Negative for dizziness.  Hematological: Does not bruise/bleed easily.       Objective:   Physical Exam  Constitutional: She appears well-developed and well-nourished.  Non-toxic appearance. She does not appear ill. No distress.  HENT:  Head: Normocephalic and atraumatic.  Right Ear: Hearing, tympanic membrane and ear canal normal. No drainage, swelling or tenderness. No middle ear effusion.  Left Ear: Hearing, tympanic membrane and ear canal normal. No swelling or tenderness.  No middle ear effusion.  Mouth/Throat: Uvula is midline and mucous membranes are normal. No oral lesions. No uvula swelling. Posterior oropharyngeal edema and posterior oropharyngeal erythema present. No oropharyngeal exudate or tonsillar abscesses. Tonsils are 0 on the right. Tonsils are 0 on the left.  Eyes: Pupils are equal, round, and reactive to light. EOM are normal.  Neck: Normal range of motion. Neck supple.  Cardiovascular: Normal rate, regular rhythm, normal heart sounds and intact distal pulses.  No murmur heard. Pulmonary/Chest: Effort normal and breath sounds normal. No stridor. No respiratory distress. She has no wheezes. She has no rhonchi. She has  no rales. She exhibits no tenderness.  Lymphadenopathy:    She has no cervical adenopathy.  Neurological: She is alert.  Skin: Skin is warm and dry. Capillary refill takes less than 2 seconds. No rash noted. She is not diaphoretic. No erythema. No pallor.  Psychiatric: She has a normal mood and affect. Her behavior is normal.  Nursing note and vitals reviewed.     Assessment & Plan:   1. Sore throat     Sore throat Strep Test Negative Increase water and Vit c-2,000mg /day. Continue OTC Acetaminophen for discomfort. Use prescription Flonase if nasal drainage increases. Please call clinic with  questions/concerns.    FOLLOW-UP:  Return if symptoms worsen or fail to improve.

## 2018-10-29 NOTE — Assessment & Plan Note (Signed)
Strep Test Negative Increase water and Vit c-2,000mg /day. Continue OTC Acetaminophen for discomfort. Use prescription Flonase if nasal drainage increases. Please call clinic with questions/concerns.

## 2018-10-29 NOTE — Patient Instructions (Signed)

## 2018-11-04 ENCOUNTER — Telehealth: Payer: Self-pay

## 2018-11-04 NOTE — Telephone Encounter (Signed)
Patient was given amoxicillin on 09/25/2018 for otitis media by me.  Seen on 12 /10 by my colleague Katie and testing at that time was negative.  Supportive care was recommended.     - After reading through [facility] office visit note, and with the patient NOW having a hard time breathing and feeling short of breath at times - this would be a change from what she was seen for recently-  and unfortunately without reexamining her and seeing if something additional is going on, we cannot make any further recommendations based on just that office visit note and her current new symptoms.    If she does not want to come in and be reexamined only after 3 days of symptoms, I recommend she continue to monitor for fevers, do supportive care and if symptoms worsen after 7 to 10 days or no improvement after 10 days, I recommend she be seen.  If at any time she takes a turn for the worse, she should be seen as well.

## 2018-11-04 NOTE — Telephone Encounter (Signed)
Patient called states that she is not feeling any improvement and that sinus congestion and pressure started on Friday (3 days ago).  Patient states that she is having a hard time breathing and is short of breath at times.  Patient is requesting a refill on ProAir Inhaler, she states that she has used in the past for same symptoms and it has helped.  Patient was last seen on 10/29/2018 by Mina Marble, NP for sore throat.  Please review and advise. MPulliam, CMA/RT(R)

## 2018-11-04 NOTE — Telephone Encounter (Signed)
Called and notified patient.  She states that she will call back to make appointment this week. MPulliam, CMA/RT(R)

## 2019-01-14 ENCOUNTER — Ambulatory Visit: Payer: Medicare Other | Admitting: Family Medicine

## 2019-01-15 ENCOUNTER — Ambulatory Visit (INDEPENDENT_AMBULATORY_CARE_PROVIDER_SITE_OTHER): Payer: Medicare Other | Admitting: Family Medicine

## 2019-01-15 ENCOUNTER — Encounter: Payer: Self-pay | Admitting: Family Medicine

## 2019-01-15 VITALS — BP 135/81 | HR 71 | Ht 67.0 in | Wt 154.5 lb

## 2019-01-15 DIAGNOSIS — E781 Pure hyperglyceridemia: Secondary | ICD-10-CM | POA: Diagnosis not present

## 2019-01-15 DIAGNOSIS — R748 Abnormal levels of other serum enzymes: Secondary | ICD-10-CM

## 2019-01-15 DIAGNOSIS — E782 Mixed hyperlipidemia: Secondary | ICD-10-CM | POA: Diagnosis not present

## 2019-01-15 DIAGNOSIS — F4323 Adjustment disorder with mixed anxiety and depressed mood: Secondary | ICD-10-CM | POA: Insufficient documentation

## 2019-01-15 DIAGNOSIS — E7800 Pure hypercholesterolemia, unspecified: Secondary | ICD-10-CM

## 2019-01-15 DIAGNOSIS — E038 Other specified hypothyroidism: Secondary | ICD-10-CM

## 2019-01-15 DIAGNOSIS — F17210 Nicotine dependence, cigarettes, uncomplicated: Secondary | ICD-10-CM

## 2019-01-15 MED ORDER — FLUOXETINE HCL 10 MG PO TABS: ORAL_TABLET | ORAL | 0 refills | Status: DC

## 2019-01-15 NOTE — Progress Notes (Signed)
Impression and Recommendations:    1. Adjustment disorder with mixed anxiety and depressed mood   2. Mixed hyperlipidemia   3. Elevated LDL cholesterol level   4. Hypertriglyceridemia   5. Elevated liver enzymes   6. Smoking greater than 30 pack years   7. Other specified hypothyroidism      1. Mixed Hyperlipidemia; Elevated LDL; Hypertriglyceridemia  - LDL = 183, elevated last check at end of October. - Fasting lipid profile drawn today.  - Patient tried the Zetia again.  Per patient, on the Zetia, she experienced bad sinus congestion, her feet tingled badly, her legs and back cramped and hurt, and she experienced headaches and dizziness.  When she quit the Zetia, all of these symptoms went away.   - Asked CMA to add this med to her allergy list and add s-e complains of pts  - To improve cholesterol with lifestyle changes, advised patient to engage in 60 minutes of cardiovascular activity five days per week, or 300 minutes of cardiovascular activity per week.  - Ongoing dietary changes such as low saturated & trans fat and low carb/high protein diet discussed with patient.  Encouraged regular exercise and weight loss when appropriate.   - Educational handouts provided at patient's desire.  - Strongly advised patient to follow up with lipid clinic/cardiology in future.   - Patient declines cardiology/   lipid clinic referral today even though I think that is best since she has been intol to ALL Chol meds tried, from various classes. - Discussed that intensive diet and lifestyle changes is needed to help control levels.   Gave pt handouts of this today   2. Adjustment Disorder, Mixed Anxiety & Depressed Mood - Mood suboptimally managed at this time. - Begin fluoxetine (prozac) today. - Begin with 10 mg tablets, starting with a half tablet per day.  - Reviewed the "spokes of the wheel" of mood and health management.  Stressed the importance of ongoing prudent habits,  including regular exercise, appropriate sleep hygiene, healthful dietary habits, and prayer/meditation to relax.  - Advised patient to cut back on caffeine if her anxiety is bad.   3. BMI Counseling - BMI WNL, 24.20 Explained to patient what BMI refers to, and what it means medically.    Told patient to think about it as a "medical risk stratification measurement" and how increasing BMI is associated with increasing risk/ or worsening state of various diseases such as hypertension, hyperlipidemia, diabetes, premature OA, depression etc.  American Heart Association guidelines for healthy diet, basically Mediterranean diet, and exercise guidelines of 30 minutes 5 days per week or more discussed in detail.  Health counseling performed.  All questions answered.   4. Lifestyle & Preventative Health Maintenance - Advised patient to continue working toward exercising to improve overall mental, physical, and emotional health.    - Encouraged patient to engage in daily physical activity, especially a formal exercise routine.  Recommended that the patient eventually strive for at least 150-300 minutes of moderate cardiovascular activity per week according to guidelines established by the [facility].   - Healthy dietary habits encouraged, including low-carb, and high amounts of lean protein in diet.   - Patient should also consume adequate amounts of water.   Orders Placed This Encounter  Procedures  . Lipid panel    Meds ordered this encounter  Medications  . FLUoxetine (PROZAC) 10 MG tablet    Sig: Take 0.5 tablets (5 mg total) by mouth daily for  6 days, THEN 1 tablet (10 mg total) daily for 6 days, THEN 2 tablets (20 mg total) daily.    Dispense:  180 tablet    Refill:  0    Medications Discontinued During This Encounter  Medication Reason  . ezetimibe (ZETIA) 10 MG tablet Patient Preference     Gross side effects, risk and benefits, and alternatives of medications and treatment plan in  general discussed with patient.  Patient is aware that all medications have potential side effects and we are unable to predict every side effect or drug-drug interaction that may occur.   Patient will call with any questions prior to using medication if they have concerns.    Expresses verbal understanding and consents to current therapy and treatment regimen.  No barriers to understanding were identified.  Red flag symptoms and signs discussed in detail.  Patient expressed understanding regarding what to do in case of emergency\urgent symptoms  Please see AVS handed out to patient at the end of our visit for further patient instructions/ counseling done pertaining to today's office visit.   Return in about 8 weeks (around 03/12/2019) for 2) mood- started SSRI; discuss stress.     Note:  This note was prepared with assistance of Dragon voice recognition software. Occasional wrong-word or sound-a-like substitutions may have occurred due to the inherent limitations of voice recognition software.   This document serves as a record of services personally performed by Mellody Dance, DO. It was created on her behalf by Toni Amend, a trained medical scribe. The creation of this record is based on the scribe's personal observations and the provider's statements to them.   I have reviewed the above medical documentation for accuracy and completeness and I concur.  Mellody Dance, DO 01/17/2019 5:34 PM       ------------------------------------------------------------------------------------------------------------------------------    Subjective:     HPI: Lori Montoya is a 68 y.o. female who presents to [facility] today for issues as discussed below.  Sleep Habits Notes she sleeps, but has to get up several times per night to go to the bathroom.  Confirms that she does feel rested, other than getting up to use the bathroom in the night.  Caretaker  Stress Patient states she feels tired.  She continues taking care of Lori Montoya at home.  In the recent past, states that [name] completely quit getting up, and hospice gave her about 2-3 days to live.  Patient states that Bartolo Darter has since bounced back, but hospice now continues to come in as a Marine scientist.  She continues to change and bathe miss [name].  "I don't want the bath people and all that."    Notes her caregiver stress is not always easy to handle, but not that bad overall.  Her sisters are helping her out.  Liver Concerns Notes she avoids statins and tries to avoid Tylenol because of concerns about her liver.  1. 67 y.o. female here for cholesterol follow-up.   - Patient reports good compliance with medications or treatment plan.  She continues taking   - Reports medication S-E on the Zetia.  Notes when she tried the Zetia again, she experienced bad sinus congestion, her feet tingled really badly, her legs and back cramped and hurt, and she experienced headaches and dizziness.  When she quit the Zetia, all of these symptoms went away.  Says "I don't eat anything fat."  States "all I eat is salmon and grilled chicken," though sometimes she  does eat some parmesan crusted chicken time to time.  Remarks "oh lord, that'll make a tadpole slap a whale."   - Smoking Status noted   - She denies new onset of: chest pain, exercise intolerance, shortness of breath, dizziness, visual changes, headache, lower extremity swelling or claudication.   Experienced myalgias while managed on Zetia.  The cholesterol last visit was:  Lab Results  Component Value Date   CHOL 245 (H) 01/15/2019   HDL 42 01/15/2019   LDLCALC 178 (H) 01/15/2019   TRIG 124 01/15/2019   CHOLHDL 5.8 (H) 01/15/2019    Hepatic Function Latest Ref Rng & Units 09/13/2018 05/16/2018 09/17/2017  Total Protein 6.0 - 8.5 g/dL 7.4 7.0 7.3  Albumin 3.6 - 4.8 g/dL 4.5 4.2 4.5  AST 0 - 40 IU/L 37 45(H) 40  ALT 0 - 32 IU/L 25  33(H) 28  Alk Phosphatase 39 - 117 IU/L 141(H) 186(H) 161(H)  Total Bilirubin 0.0 - 1.2 mg/dL 0.4 0.3 0.4  Bilirubin, Direct 0.00 - 0.40 mg/dL - - -     Wt Readings from Last 3 Encounters:  01/15/19 154 lb 8 oz (70.1 kg)  10/29/18 153 lb 8 oz (69.6 kg)  09/25/18 153 lb 6.4 oz (69.6 kg)   BP Readings from Last 3 Encounters:  01/15/19 135/81  10/29/18 130/74  09/25/18 (!) 144/87   Pulse Readings from Last 3 Encounters:  01/15/19 71  10/29/18 77  09/25/18 91   BMI Readings from Last 3 Encounters:  01/15/19 24.20 kg/m  10/29/18 24.04 kg/m  09/25/18 24.03 kg/m   Depression screen PHQ 2/9 01/15/2019  Decreased Interest 0  Down, Depressed, Hopeless 0  PHQ - 2 Score 0  Altered sleeping 0  Tired, decreased energy 3  Change in appetite 0  Feeling bad or failure about yourself  0  Trouble concentrating 0  Moving slowly or fidgety/restless 0  Suicidal thoughts 0  PHQ-9 Score 3  Difficult doing work/chores Not difficult at all    Patient Care Team    Relationship Specialty Notifications Start End  Mellody Dance, DO PCP - General Family Medicine  09/12/16   Teena Irani, MD (Inactive) Consulting Physician Gastroenterology  09/12/16   Jerrell Belfast, MD Consulting Physician Otolaryngology  09/12/16   Darlin Coco, MD  Cardiology  09/12/16   Wallace Going, DO Attending Physician Plastic Surgery  01/15/18      Patient Active Problem List   Diagnosis Date Noted  . Smoking greater than 30 pack years 09/12/2016    Priority: High  . Hypothyroidism 09/12/2016    Priority: High  . Hyperlipidemia 09/12/2016    Priority: High  . COPD (chronic obstructive pulmonary disease) ? 09/12/2016    Priority: High  . GERD (gastroesophageal reflux disease) 09/12/2016    Priority: High  . Vitamin D insufficiency 11/03/2016    Priority: Medium  . Osteoporosis 09/12/2016    Priority: Low  . Adjustment disorder with mixed anxiety and depressed mood 01/15/2019  . Sore  throat 10/29/2018  . Gastroesophageal reflux disease without esophagitis 09/13/2018  . Elevated LDL cholesterol level 05/13/2018  . Dysfunction of eustachian tube 05/13/2018  . Hypertriglyceridemia 05/13/2018  . Varicose veins of both legs with edema 05/13/2018  . Status post breast reduction 03/19/2018  . Sclerosing adenosis of breast, right 03/05/2018  . Fibrocystic breast changes, bilateral 03/05/2018  . Elevated liver enzymes 09/17/2017  . Other fatigue 02/20/2017  . Myalgia 02/20/2017  . Degenerative disc disease, lumbar 09/12/2016  . Elevated alkaline  phosphatase level 09/12/2016  . Rosacea 09/12/2016  . 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 Intermittent palpitations 07/20/2014  . Frequent unifocal PVCs 07/20/2014    Past Medical history, Surgical history, Family history, Social history, Allergies and Medications have been entered into the medical record, reviewed and changed as needed.    Current Meds  Medication Sig  . Calcium Carbonate ([employer] CALCIUM) 1500 MG TABS Take by mouth.  . Cholecalciferol (VITAMIN D3) 1000 units CAPS Take 5 capsules (5,000 Units total) by mouth daily.  . fluticasone (FLONASE) 50 MCG/ACT nasal spray 1 spray into each nostril twice daily after your sinus rinse  . levothyroxine (SYNTHROID, LEVOTHROID) 100 MCG tablet TAKE 1 TABLET BY MOUTH DAILY.    Allergies:  Allergies  Allergen Reactions  . Alendronate Other (See Comments)    Bone pain  . Zetia [Ezetimibe] Other (See Comments)    Feet burning, bilat leg pains, Has, recurrent sinus problems     Review of Systems:  A fourteen system review of systems was performed and found to be positive as per HPI.   Objective:   Blood pressure 135/81, pulse 71, height '5\' 7"'  (1.702 m), weight 154 lb 8 oz (70.1 kg), SpO2 98 %. Body mass index is 24.2 kg/m. General:  Well Developed, well nourished, appropriate for stated age.  Neuro:  Alert and oriented,  extra-ocular muscles intact  HEENT:   Normocephalic, atraumatic, neck supple, no carotid bruits appreciated  Skin:  no gross rash, warm, pink. Cardiac:  RRR, S1 S2 Respiratory:  ECTA B/L and A/P, Not using accessory muscles, speaking in full sentences- unlabored. Vascular:  Ext warm, no cyanosis apprec.; cap RF less 2 sec. Psych:  No HI/SI, judgement and insight good, Euthymic mood. Full Affect.

## 2019-01-15 NOTE — Patient Instructions (Addendum)
Begin your Prozac at a half-tablet daily, for two days.  (You may take a half-tablet up to six days, if desired.)  When starting this medication, you may feel a little bit of a headache or nausea at first, very mild.  These symptoms should go away.  If symptoms go away after two days, increase dose of Prozac to one full 10 mg tablet.  If 10 mg of Prozac is not enough, remember that 20 mg is the usual starting dose for mot people.  Remember to do something to take care of yourself, such as walking, engaging in yoga/meditatoin or prayer, in addition to regular exercise!

## 2019-01-16 LAB — LIPID PANEL
CHOLESTEROL TOTAL: 245 mg/dL — AB (ref 100–199)
Chol/HDL Ratio: 5.8 ratio — ABNORMAL HIGH (ref 0.0–4.4)
HDL: 42 mg/dL (ref >39)
LDL Calculated: 178 mg/dL — ABNORMAL HIGH (ref 0–99)
Triglycerides: 124 mg/dL (ref 0–149)
VLDL Cholesterol Cal: 25 mg/dL (ref 5–40)

## 2019-01-17 ENCOUNTER — Other Ambulatory Visit: Payer: Self-pay

## 2019-01-17 DIAGNOSIS — E785 Hyperlipidemia, unspecified: Secondary | ICD-10-CM

## 2019-01-24 ENCOUNTER — Telehealth: Payer: Self-pay | Admitting: Family Medicine

## 2019-01-24 NOTE — Telephone Encounter (Signed)
Patient called states she & sister go together yearly for their Annual Breast exam @ the [place] (they called to schedule but was told Dr. Raliegh Scarlet had to order Lexxie's as a Diagnostic due to her breast Hx.  --Forwarding request to medical assistant.  --Dion Body

## 2019-01-27 ENCOUNTER — Other Ambulatory Visit: Payer: Self-pay

## 2019-01-27 DIAGNOSIS — N6012 Diffuse cystic mastopathy of left breast: Secondary | ICD-10-CM

## 2019-01-27 DIAGNOSIS — N6021 Fibroadenosis of right breast: Secondary | ICD-10-CM

## 2019-01-27 DIAGNOSIS — N6011 Diffuse cystic mastopathy of right breast: Secondary | ICD-10-CM

## 2019-01-27 NOTE — Telephone Encounter (Signed)
Order placed patient notified. MPulliam, CMA/RT(R)

## 2019-02-04 ENCOUNTER — Other Ambulatory Visit: Payer: Self-pay | Admitting: Family Medicine

## 2019-02-04 DIAGNOSIS — Z1231 Encounter for screening mammogram for malignant neoplasm of breast: Secondary | ICD-10-CM

## 2019-02-11 ENCOUNTER — Other Ambulatory Visit: Payer: Self-pay

## 2019-02-11 ENCOUNTER — Telehealth: Payer: Self-pay

## 2019-02-11 ENCOUNTER — Telehealth: Payer: Self-pay | Admitting: Family Medicine

## 2019-02-11 MED ORDER — LEVOTHYROXINE SODIUM 100 MCG PO TABS: 100.0000 ug | ORAL_TABLET | Freq: Every day | ORAL | 0 refills | Status: DC

## 2019-02-11 NOTE — Telephone Encounter (Signed)
Called patient in regards to a refill and patient mentioned that she canceled her follow up appointment recently.  She states that it was a follow up on starting Prozac but patient states that she was not able to take the medication.  Patient took for 3 weeks and medication caused headaches and dizziness, patient weaned herself off the medication the 3rd week.  Per patient all symptoms have resolved after stopping the medication.  Patient states that she is doing well without them and does not feel like she needs to try a different medication at this time.  Medication d/cd in chart and listed as intolerance.  MPulliam, CMA/RT(R)

## 2019-02-11 NOTE — Telephone Encounter (Signed)
Patient requesting refill on levothyroxine, reviewed chart and sent in refill per office policy.  Patient notified. MPulliam, CMA/RT(R)

## 2019-02-11 NOTE — Telephone Encounter (Signed)
Refill sent and patient notified. MPulliam, CMA/RT(R)

## 2019-02-11 NOTE — Telephone Encounter (Signed)
Patient is requesting a refill of her thyroid meds, she states she has 22 days left but is hoping we can refill it a little earlier. She is concerned about a shutdown and wants to have everything she needs before this could happen. If approved please send to [facility]

## 2019-03-06 ENCOUNTER — Ambulatory Visit: Payer: Medicare Other | Admitting: Interventional Cardiology

## 2019-03-12 ENCOUNTER — Ambulatory Visit: Payer: Medicare Other | Admitting: Family Medicine

## 2019-03-27 ENCOUNTER — Ambulatory Visit: Payer: Medicare Other

## 2019-04-17 ENCOUNTER — Telehealth: Payer: Self-pay

## 2019-04-17 ENCOUNTER — Encounter: Payer: Self-pay | Admitting: Family Medicine

## 2019-04-17 NOTE — Telephone Encounter (Signed)

## 2019-04-21 ENCOUNTER — Telehealth: Payer: Medicare Other | Admitting: Interventional Cardiology

## 2019-05-14 IMAGING — MG MM PLC BREAST LOC DEV 1ST LESION INC*R*
5 series · 6 of 9 positions shown · non-contrast
Comparison: Previous exam(s).

CLINICAL DATA: 66-year-old patient presents for radioactive seed
localization prior to excisional biopsy of a complex sclerosing
lesion.

EXAM:
MAMMOGRAPHIC GUIDED RADIOACTIVE SEED LOCALIZATION OF THE RIGHT
BREAST

[R CC]
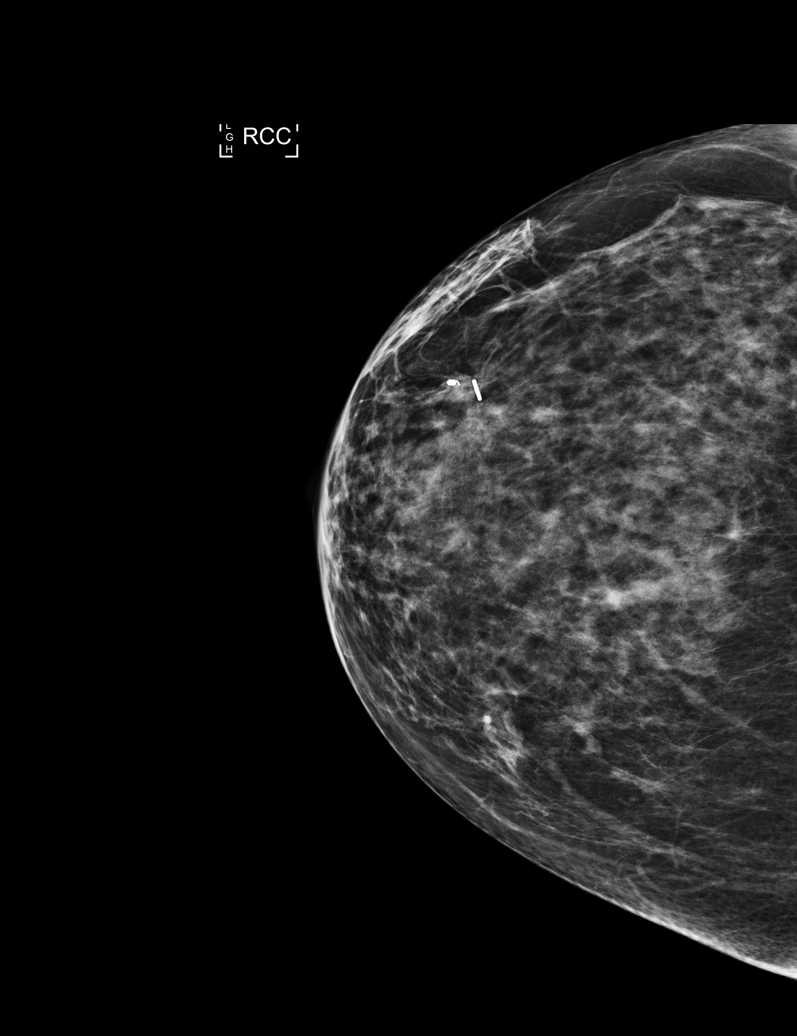

[R LM (1 of 2)]
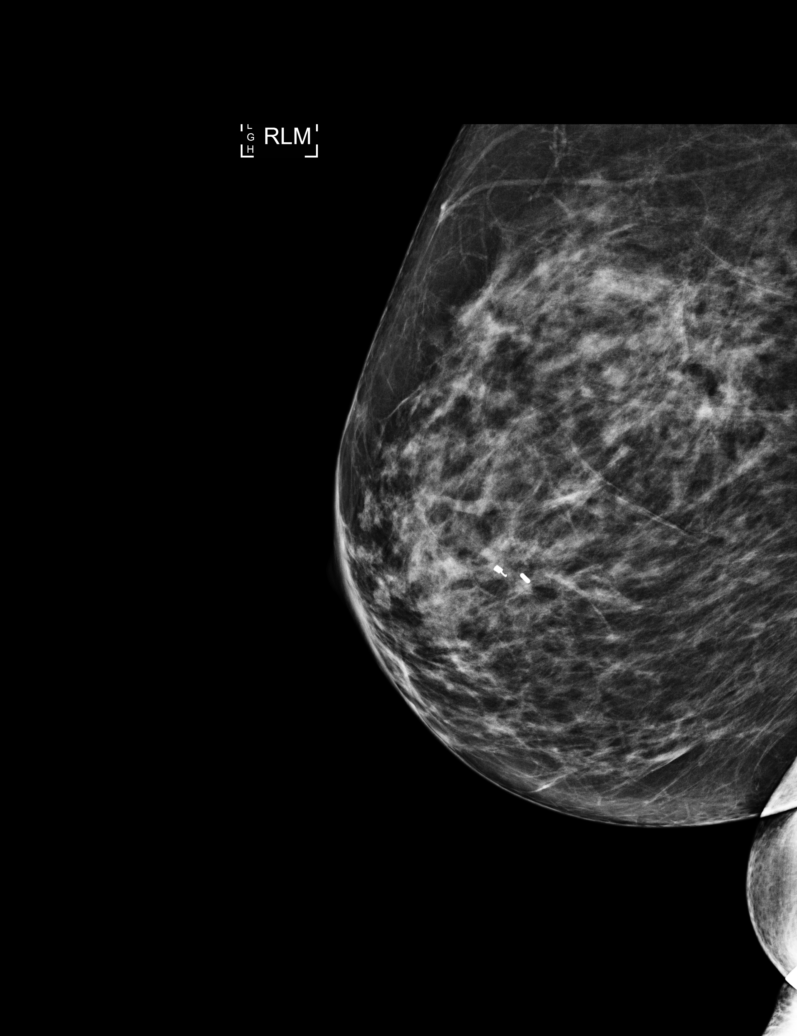

[R LM (2 of 2)]
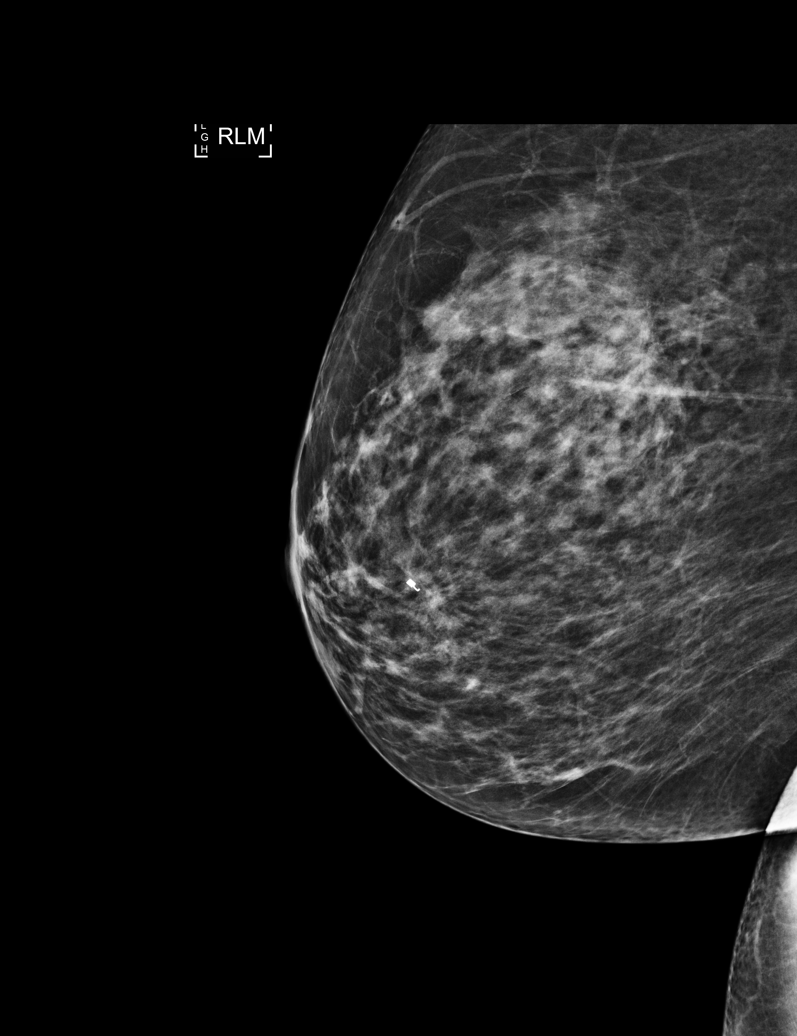

[R LM synth-2D]
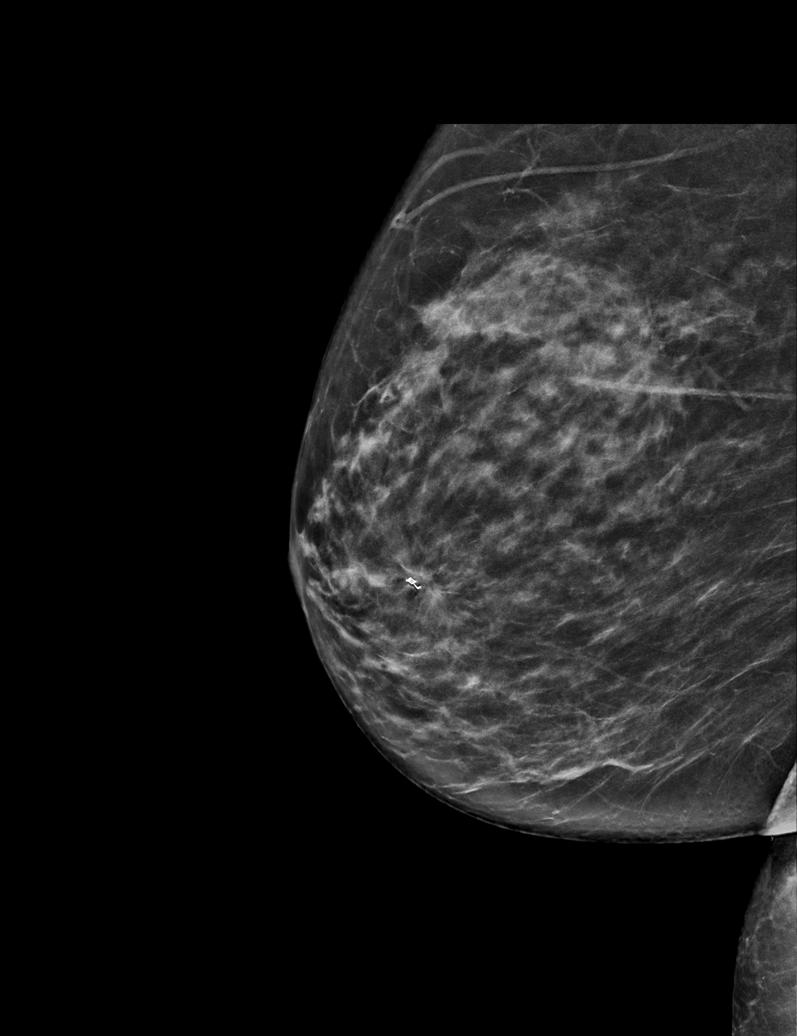

[R LM tomo · 2 of 64 frames shown]
[frame 21/64]
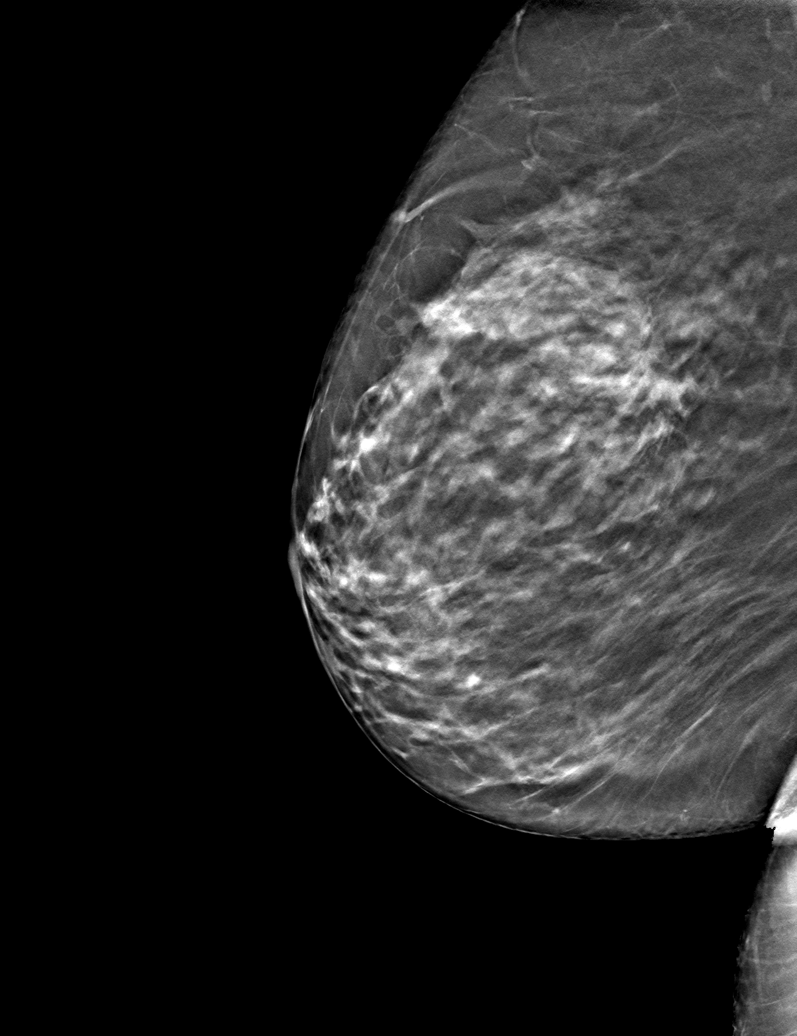
[frame 33/64]
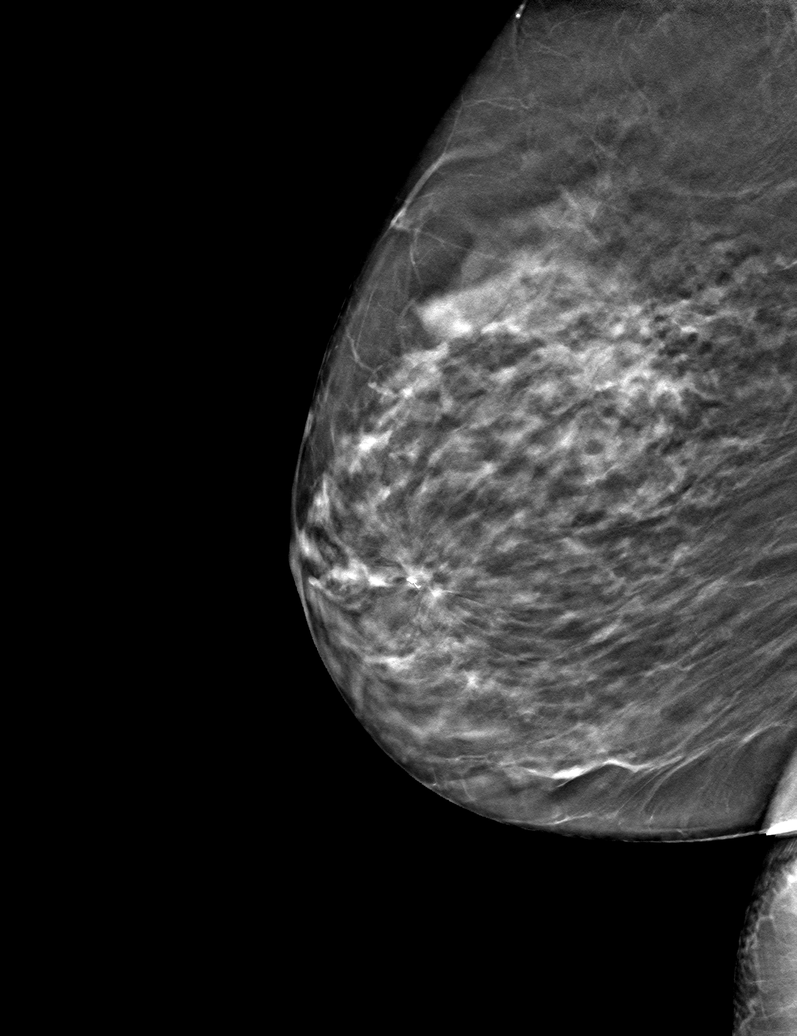

[6 of 9 positions shown; findings below may reference images not displayed]

FINDINGS: Patient presents for radioactive seed localization prior to
excisional biopsy. I met with the patient and we discussed the
procedure of seed localization including benefits and alternatives.
We discussed the high likelihood of a successful procedure. We
discussed the risks of the procedure including infection, bleeding,
tissue injury and further surgery. We discussed the low dose of
radioactivity involved in the procedure. Informed, written consent
was given.

The usual time-out protocol was performed immediately prior to the
procedure.

90 degree lateral view of the right breast with tomography was
obtained prior to performing the procedure today. These images
confirm that the coil shaped biopsy clip is satisfactorily
positioned within the area of architectural distortion. Post biopsy
changes have significantly diminished since the post biopsy
mammogram December 17, 2017, and the distortion is visible.

Using mammographic guidance, sterile technique, 1% lidocaine and an
P-GLK radioactive seed, the area of architectural distortion and a
coil shaped biopsy clip was localized using a lateral approach. The
follow-up mammogram images confirm the seed in the expected location
and were marked for Dr. Yerson Andres.

Follow-up survey of the patient confirms presence of the radioactive
seed.

Order number of P-GLK seed:  [id number].

Total activity: 0.255 mCi reference Date: 25 February, 2018

The patient tolerated the procedure well and was released from the
[REDACTED]. She was given instructions regarding seed removal.
IMPRESSION: Radioactive seed localization right breast. No apparent
complications.

## 2019-05-19 ENCOUNTER — Ambulatory Visit
Admission: RE | Admit: 2019-05-19 | Discharge: 2019-05-19 | Disposition: A | Discharge status: Home / Self Care | Payer: Medicare Other | Source: Ambulatory Visit | Attending: Family Medicine | Admitting: Family Medicine

## 2019-05-19 ENCOUNTER — Other Ambulatory Visit: Payer: Self-pay

## 2019-05-19 DIAGNOSIS — Z1231 Encounter for screening mammogram for malignant neoplasm of breast: Secondary | ICD-10-CM | POA: Diagnosis not present

## 2019-06-04 ENCOUNTER — Other Ambulatory Visit: Payer: Self-pay | Admitting: Family Medicine

## 2019-06-11 DIAGNOSIS — Z03818 Encounter for observation for suspected exposure to other biological agents ruled out: Secondary | ICD-10-CM | POA: Diagnosis not present

## 2019-07-02 ENCOUNTER — Other Ambulatory Visit: Payer: Self-pay | Admitting: Family Medicine

## 2019-07-24 NOTE — Progress Notes (Signed)
Cardiology Office Note   Date:  07/25/2019   ID:  Lori Montoya, DOB [birth year], MRN [id number]  PCP:  Lori Dance, DO    No chief complaint on file.  High cholesterol  Wt Readings from Last 3 Encounters:  07/25/19 158 lb 12.8 oz (72 kg)  01/15/19 154 lb 8 oz (70.1 kg)  10/29/18 153 lb 8 oz (69.6 kg)       History of Present Illness: Lori Montoya is a 68 y.o. female who is being seen today for the evaluation of high cholesterol at the request of Lori Dance, DO.  She had palpitations in 2015 and a negative w/u with Dr. Mare Montoya.  Hyperlipidemia has been since 2015.  She has had issues with high liver enzymes which prevented use of statins. She thinks she was on lipitor in the past, but this made her liver enzymes worse.  She has tried Zetia as well, which did not bring down her lipids as much.  She felt some headache with Zetia.   She has not been exercising regularly for several years.  Her husand passed away.  She does not have to do personal care for anyone anymore.  She avoids red meat.    She avoids fried foods.    Denies : Chest pain. Dizziness. Leg edema. Nitroglycerin use. Orthopnea. Palpitations. Paroxysmal nocturnal dyspnea. Shortness of breath. Syncope.    Past Medical History:  Diagnosis Date  . Elevated alkaline phosphatase level   . GERD (gastroesophageal reflux disease)   . Hyperlipidemia   . Osteoporosis   . Other and unspecified disc disorder of lumbar region   . Personal history of other disorders of nervous system and sense organs   . Seizures ([place])    last seizure in 1977  . Tobacco use disorder   . Unspecified hypothyroidism     Past Surgical History:  Procedure Laterality Date  . BREAST EXCISIONAL BIOPSY Right    benign  . BREAST LUMPECTOMY WITH RADIOACTIVE SEED LOCALIZATION Right 02/28/2018   Procedure: RIGHT BREAST LUMPECTOMY WITH RADIOACTIVE SEED LOCALIZATION ERAS PATHWAY;  Surgeon: Erroll Luna, MD;  Location: [place];  Service: General;  Laterality: Right;  . BREAST REDUCTION SURGERY Bilateral 02/28/2018   Procedure: BILATERA BREAST REDUCTION FOR SYMMERTY;  Surgeon: Wallace Going, DO;  Location: [place];  Service: Plastics;  Laterality: Bilateral;  . CHOLECYSTECTOMY  2011   lap choli  . COLONOSCOPY    . MICROLARYNGOSCOPY Left 01/01/2015   Procedure: MICROLARYNGOSCOPY WITH EXCISION OF LEFT VOCAL CORD NODULE ;  Surgeon: Jerrell Belfast, MD;  Location: [place];  Service: ENT;  Laterality: Left;  . REDUCTION MAMMAPLASTY Left      Current Outpatient Medications  Medication Sig Dispense Refill  . Calcium Carbonate ([employer]S CALCIUM) 1500 MG TABS Take by mouth.    . Cholecalciferol (VITAMIN D3) 1000 units CAPS Take 5 capsules (5,000 Units total) by mouth daily. 60 capsule   . fluticasone (FLONASE) 50 MCG/ACT nasal spray 1 spray into each nostril twice daily after your sinus rinse 16 g 2  . levothyroxine (SYNTHROID) 100 MCG tablet TAKE 1 TABLET (100 MCG TOTAL) BY MOUTH DAILY. NEEDS OFFICE VISIT FOR REFILLS 30 tablet 0   No current facility-administered medications for this visit.     Allergies:   Alendronate, Prozac [fluoxetine], and Zetia [ezetimibe]    Social History:  The patient  reports that she quit smoking about 4 years ago. Her smoking use included cigarettes.  She has a 48.00 pack-year smoking history. She has never used smokeless tobacco. She reports that she does not drink alcohol or use drugs.   Family History:  The patient's family history includes ALS in her brother; Alcohol abuse in her brother and mother; Breast cancer in her paternal aunt and paternal aunt; CVA in her mother; Cancer - Other in her father; Dementia in her maternal grandmother and mother; Hyperlipidemia in her mother; Hypertension in her mother; Hypothyroidism in her mother; Stroke in her maternal grandfather.    ROS:  Please see the history of present illness.   Otherwise,  review of systems are positive for .   All other systems are reviewed and negative.    PHYSICAL EXAM: VS:  BP (!) 144/72   Pulse 82   Ht 5\' 7"  (1.702 m)   Wt 158 lb 12.8 oz (72 kg)   SpO2 98%   BMI 24.87 kg/m  , BMI Body mass index is 24.87 kg/m. GEN: Well nourished, well developed, in no acute distress  HEENT: normal  Neck: no JVD, carotid bruits, or masses Cardiac: RRR; no murmurs, rubs, or gallops,no edema  Respiratory:  clear to auscultation bilaterally, normal work of breathing GI: soft, nontender, nondistended, + BS MS: no deformity or atrophy  Skin: warm and dry, no rash Neuro:  Strength and sensation are intact Psych: euthymic mood, full affect   EKG:   The ekg ordered today demonstrates NSR, nonspecific ST changes, PACs   Recent Labs: 09/13/2018: ALT 25; BUN 12; Creatinine, Ser 0.82; Potassium 4.6; Sodium 142   Lipid Panel    Component Value Date/Time   CHOL 245 (H) 01/15/2019 1607   TRIG 124 01/15/2019 1607   HDL 42 01/15/2019 1607   CHOLHDL 5.8 (H) 01/15/2019 1607   LDLCALC 178 (H) 01/15/2019 1607     Other studies Reviewed: Additional studies/ records that were reviewed today with results demonstrating:labs reviewed .   ASSESSMENT AND PLAN:  1. Hyperlipidemia:  LDL above target.  Will retry Zetia 10 mg daily.  Recheck lipids in 3 months.   Increase exercise.  Plan for referral to lipid clinic based on results of repeat lipids.  2. DOE: Better when she walks on a more consistent basis.  I think she is a little deconditioned. 3. Hypothyroid: On supplement.  To be rechecked soon.     Current medicines are reviewed at length with the patient today.  The patient concerns regarding her medicines were addressed.  The following changes have been made:  No change  Labs/ tests ordered today include:  No orders of the defined types were placed in this encounter.   Recommend 150 minutes/week of aerobic exercise Low fat, low carb, high fiber diet  recommended  Disposition:   FU in for lab work in 3 months   Signed, Larae Grooms, MD  07/25/2019 [facility] [address], [place], [place]  [address] Phone: [phone]; Fax: [phone]

## 2019-07-25 ENCOUNTER — Encounter: Payer: Self-pay | Admitting: Interventional Cardiology

## 2019-07-25 ENCOUNTER — Ambulatory Visit (INDEPENDENT_AMBULATORY_CARE_PROVIDER_SITE_OTHER): Payer: Medicare Other | Admitting: Interventional Cardiology

## 2019-07-25 ENCOUNTER — Other Ambulatory Visit: Payer: Self-pay

## 2019-07-25 VITALS — BP 144/72 | HR 82 | Ht 67.0 in | Wt 158.8 lb

## 2019-07-25 DIAGNOSIS — E039 Hypothyroidism, unspecified: Secondary | ICD-10-CM

## 2019-07-25 DIAGNOSIS — R0600 Dyspnea, unspecified: Secondary | ICD-10-CM

## 2019-07-25 DIAGNOSIS — R0609 Other forms of dyspnea: Secondary | ICD-10-CM | POA: Diagnosis not present

## 2019-07-25 DIAGNOSIS — E785 Hyperlipidemia, unspecified: Secondary | ICD-10-CM | POA: Diagnosis not present

## 2019-07-25 DIAGNOSIS — I493 Ventricular premature depolarization: Secondary | ICD-10-CM

## 2019-07-25 MED ORDER — EZETIMIBE 10 MG PO TABS: 10.0000 mg | ORAL_TABLET | Freq: Every day | ORAL | 3 refills | Status: DC

## 2019-07-25 NOTE — Patient Instructions (Addendum)
Medication Instructions:    Your physician has recommended you make the following change in your medication:  START ZETIA 10 MG DAILY  If you need a refill on your cardiac medications before your next appointment, please call your pharmacy.   Lab work: Your physician recommends that you return for FASTING lab work in:  [address] [place]  If you have labs (blood work) drawn today and your tests are completely normal, you will receive your results only by: Marland Kitchen MyChart Message (if you have MyChart) OR . A paper copy in the mail If you have any lab test that is abnormal or we need to change your treatment, we will call you to review the results.  Testing/Procedures: None Ordered  Follow-Up: Will be determined based on lab results

## 2019-07-30 ENCOUNTER — Ambulatory Visit (INDEPENDENT_AMBULATORY_CARE_PROVIDER_SITE_OTHER): Payer: Medicare Other | Admitting: Family Medicine

## 2019-07-30 ENCOUNTER — Encounter: Payer: Self-pay | Admitting: Family Medicine

## 2019-07-30 ENCOUNTER — Other Ambulatory Visit: Payer: Self-pay

## 2019-07-30 VITALS — BP 131/82 | HR 84 | Temp 98.6°F | Resp 16 | Ht 67.0 in | Wt 157.6 lb

## 2019-07-30 DIAGNOSIS — E038 Other specified hypothyroidism: Secondary | ICD-10-CM | POA: Diagnosis not present

## 2019-07-30 DIAGNOSIS — J449 Chronic obstructive pulmonary disease, unspecified: Secondary | ICD-10-CM | POA: Diagnosis not present

## 2019-07-30 DIAGNOSIS — F17210 Nicotine dependence, cigarettes, uncomplicated: Secondary | ICD-10-CM | POA: Diagnosis not present

## 2019-07-30 DIAGNOSIS — Z23 Encounter for immunization: Secondary | ICD-10-CM | POA: Diagnosis not present

## 2019-07-30 DIAGNOSIS — E785 Hyperlipidemia, unspecified: Secondary | ICD-10-CM | POA: Diagnosis not present

## 2019-07-30 MED ORDER — LEVOTHYROXINE SODIUM 100 MCG PO TABS: 100.0000 ug | ORAL_TABLET | Freq: Every day | ORAL | 1 refills | Status: DC

## 2019-07-30 NOTE — Progress Notes (Signed)
Impression and Recommendations:    1. Hyperlipidemia, unspecified hyperlipidemia type   2. Other specified hypothyroidism   3. Smoking greater than 30 pack years   4. Chronic obstructive pulmonary disease, unspecified COPD type ([place])   5. Need for immunization against influenza      Hyperlipidemia, unspecified hyperlipidemia type  Other specified hypothyroidism  Smoking greater than 30 pack years  Chronic obstructive pulmonary disease, unspecified COPD type ([place])  Need for immunization against influenza - Plan: Flu Vaccine QUAD High Dose(Fluad)   - Novel Covid -19 counseling done; all questions were answered.   - Current CDC / federal and [place] guidelines reviewed with patient  - Reminded pt of extreme importance of social distancing; wearing a mask when out in public; insensate handwashing and cleaning of surfaces, avoiding unnecessary trips for shopping and avoiding ALL but emergency appts etc. - Told patient to be prepared, not scared; and be smart for the sake of others - Patient will call with any additional concerns  Hyperlipidemia - Resumed Zetia under direction of Dr. Irish Lack. - LDL last check 6 months ago was 178, down from 183 prior. - HDL was 42, down from 51 prior.  - Ongoing prudent dietary changes such as low saturated & trans fat and low carb diets discussed with patient.  Encouraged regular exercise and weight loss when appropriate.   - Educational handouts provided at patient's desire.  - Re-check as recommended. - Will continue to monitor.  Hypothyroidism - Stable at this time on current management. - Continue treatment as prescribed. - Pt tolerating meds well without complication. - Per pt, asymptomatic at this time.  - Will continue to monitor.  Smoking Greater than 30 Pack-Years, COPD - Stable at this time. - Will continue to monitor.  Vitamin D & Calcium - Continue supplementation as prescribed. - No changes made to  management today. - Re-check as recommended. - Will continue to monitor.  Health Counseling & Preventative Health Maintenance - Advised patient to continue working toward exercising to improve overall mental, physical, and emotional health.    - Reviewed the "spokes of the wheel" of mood and health management.  Stressed the importance of ongoing prudent habits, including regular exercise, appropriate sleep hygiene, healthful dietary habits, and prayer/meditation to relax.  - Encouraged patient to engage in daily physical activity, especially a formal exercise routine.  Recommended that the patient eventually strive for at least 150 minutes of moderate cardiovascular activity per week according to guidelines established by the [facility].   - Healthy dietary habits encouraged, including low-carb, and high amounts of lean protein in diet.   - Patient should also consume adequate amounts of water.  Recommendations - Discussed need for fasting lab work and [employer] as recommended. - Educated patient regarding the need for Medicare Wellness visits today.  All questions were answered.    Orders Placed This Encounter  Procedures  . Flu Vaccine QUAD High Dose(Fluad)    Meds ordered this encounter  Medications  . levothyroxine (SYNTHROID) 100 MCG tablet    Sig: Take 1 tablet (100 mcg total) by mouth daily. Needs office visit for refills    Dispense:  90 tablet    Refill:  1    Medications Discontinued During This Encounter  Medication Reason  . fluticasone (FLONASE) 50 MCG/ACT nasal spray Patient Preference  . levothyroxine (SYNTHROID) 100 MCG tablet Reorder     Gross side effects, risk and benefits, and alternatives of medications and treatment plan  in general discussed with patient.  Patient is aware that all medications have potential side effects and we are unable to predict every side effect or drug-drug interaction that may occur.   Patient will call with any questions prior to  using medication if they have concerns.    Expresses verbal understanding and consents to current therapy and treatment regimen.  No barriers to understanding were identified.  Red flag symptoms and signs discussed in detail.  Patient expressed understanding regarding what to do in case of emergency\urgent symptoms  Please see AVS handed out to patient at the end of our visit for further patient instructions/ counseling done pertaining to today's office visit.   Return for telehealth Medicare wellness visit end of Nov.  FBW that AM- same day.     Note:  This note was prepared with assistance of Dragon voice recognition software. Occasional wrong-word or sound-a-like substitutions may have occurred due to the inherent limitations of voice recognition software.    This document serves as a record of services personally performed by Mellody Dance, DO. It was created on her behalf by Toni Amend, a trained medical scribe. The creation of this record is based on the scribe's personal observations and the provider's statements to them.   I have reviewed the above medical documentation for accuracy and completeness and I concur.  Mellody Dance, DO 07/30/2019 7:52 PM       --------------------------------------------------------------------------------------------------------------------------------------------------------------------------------------------------------------------------------------------    Subjective:     HPI: Lori Montoya is a 68 y.o. female who presents to [place] [facility] today for issues as discussed below.  Denies new chest pain, SOB, GI symptoms, or other concerns.  Lifestyle & Mood States she's been doing good during COVID.  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  Says able to get  out more and be more active.  States knows she's probably not drinking enough water. Enjoys drinking coffee and decaf tea.  Breathing Problems, Probable COPD She is not smoking.  Notes she quit a while back.  Continues to have SOB as per her normal; states no more so than usual.  Hypothyroidism Continues on treatment as prescribed, 100 mcg daily.   Denies problems, concerns or symptoms.  Says she does sweat, but nothing unusual.  Denies heat or cold intolerance.  Vitamin D & Calcium Supplementation Takes Vitamin D and Calcium during the day.   Recent Visit to Cardiology She went to the heart doctor on Friday; saw Dr. Irish Lack.  She started back on the Zetia.  Notes "I dunno, I just thought I'd try."  Returning to cardiology in December to get labs done.  She is taking the Zetia at night.  1. 68 y.o. female here for cholesterol follow-up.   - Patient reports good compliance with medications or treatment plan.  Notes she's been "trying to get off the fat" and working on her diet.  - Denies medication S-E   - Smoking Status noted   - She denies new onset of: chest pain, exercise intolerance, shortness of breath, dizziness, visual changes, headache, lower extremity swelling or claudication.   Denies myalgias  The cholesterol last visit was:  Lab Results  Component Value Date   CHOL 245 (H) 01/15/2019   HDL 42 01/15/2019   LDLCALC 178 (H) 01/15/2019   TRIG 124 01/15/2019  CHOLHDL 5.8 (H) 01/15/2019    Hepatic Function Latest Ref Rng & Units 09/13/2018 05/16/2018 09/17/2017  Total Protein 6.0 - 8.5 g/dL 7.4 7.0 7.3  Albumin 3.6 - 4.8 g/dL 4.5 4.2 4.5  AST 0 - 40 IU/L 37 45(H) 40  ALT 0 - 32 IU/L 25 33(H) 28  Alk Phosphatase 39 - 117 IU/L 141(H) 186(H) 161(H)  Total Bilirubin 0.0 - 1.2 mg/dL 0.4 0.3 0.4  Bilirubin, Direct 0.00 - 0.40 mg/dL - - -     Wt Readings from Last 3 Encounters:  07/30/19 157 lb 9.6 oz (71.5 kg)  07/25/19 158 lb 12.8 oz (72 kg)   01/15/19 154 lb 8 oz (70.1 kg)   BP Readings from Last 3 Encounters:  07/30/19 131/82  07/25/19 (!) 144/72  01/15/19 135/81   Pulse Readings from Last 3 Encounters:  07/30/19 84  07/25/19 82  01/15/19 71   BMI Readings from Last 3 Encounters:  07/30/19 24.68 kg/m  07/25/19 24.87 kg/m  01/15/19 24.20 kg/m     Patient Care Team    Relationship Specialty Notifications Start End  Mellody Dance, DO PCP - General Family Medicine  09/12/16   Teena Irani, MD (Inactive) Consulting Physician Gastroenterology  09/12/16   Jerrell Belfast, MD Consulting Physician Otolaryngology  09/12/16   Darlin Coco, MD  Cardiology  09/12/16   Wallace Going, DO Attending Physician Plastic Surgery  01/15/18      Patient Active Problem List   Diagnosis Date Noted  . Smoking greater than 30 pack years-  quit Dec 2015 09/12/2016    Priority: High  . Hypothyroidism 09/12/2016    Priority: High  . Hyperlipidemia 09/12/2016    Priority: High  . COPD (chronic obstructive pulmonary disease) ? 09/12/2016    Priority: High  . GERD (gastroesophageal reflux disease) 09/12/2016    Priority: High  . Vitamin D insufficiency 11/03/2016    Priority: Medium  . Osteoporosis 09/12/2016    Priority: Low  . Adjustment disorder with mixed anxiety and depressed mood 01/15/2019  . Sore throat 10/29/2018  . Gastroesophageal reflux disease without esophagitis 09/13/2018  . Elevated LDL cholesterol level 05/13/2018  . Dysfunction of eustachian tube 05/13/2018  . Hypertriglyceridemia 05/13/2018  . Varicose veins of both legs with edema 05/13/2018  . Status post breast reduction 03/19/2018  . Sclerosing adenosis of breast, right 03/05/2018  . Fibrocystic breast changes, bilateral 03/05/2018  . Elevated liver enzymes 09/17/2017  . Other fatigue 02/20/2017  . Myalgia 02/20/2017  . Degenerative disc disease, lumbar 09/12/2016  . Elevated alkaline phosphatase level 09/12/2016  . Rosacea 09/12/2016   . 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 Intermittent palpitations 07/20/2014  . Frequent unifocal PVCs 07/20/2014    Past Medical history, Surgical history, Family history, Social history, Allergies and Medications have been entered into the medical record, reviewed and changed as needed.    Current Meds  Medication Sig  . Calcium Carbonate ([employer] CALCIUM) 1500 MG TABS Take by mouth.  . Cholecalciferol (VITAMIN D3) 1000 units CAPS Take 5 capsules (5,000 Units total) by mouth daily.  Marland Kitchen ezetimibe (ZETIA) 10 MG tablet Take 1 tablet (10 mg total) by mouth daily.  Marland Kitchen levothyroxine (SYNTHROID) 100 MCG tablet Take 1 tablet (100 mcg total) by mouth daily. Needs office visit for refills  . [DISCONTINUED] levothyroxine (SYNTHROID) 100 MCG tablet TAKE 1 TABLET (100 MCG TOTAL) BY MOUTH DAILY. NEEDS OFFICE VISIT FOR REFILLS    Allergies:  Allergies  Allergen Reactions  .  Alendronate Other (See Comments)    Bone pain  . Prozac [Fluoxetine] Other (See Comments)    Headaches and dizziness  . Zetia [Ezetimibe] Other (See Comments)    Feet burning, bilat leg pains, Has, recurrent sinus problems     Review of Systems:  A fourteen system review of systems was performed and found to be positive as per HPI.   Objective:   Blood pressure 131/82, pulse 84, temperature 98.6 F (37 C), resp. rate 16, height '5\' 7"'  (1.702 m), weight 157 lb 9.6 oz (71.5 kg), SpO2 97 %. Body mass index is 24.68 kg/m. General:  Well Developed, well nourished, appropriate for stated age.  Neuro:  Alert and oriented,  extra-ocular muscles intact  HEENT:  Normocephalic, atraumatic, neck supple, no carotid bruits appreciated  Skin:  no gross rash, warm, pink. Cardiac:  RRR, S1 S2 Respiratory:  ECTA B/L and A/P, Not using accessory muscles, speaking in full sentences- unlabored. Vascular:  Ext warm, no cyanosis apprec.; cap RF less 2 sec. Psych:  No HI/SI, judgement and insight good, Euthymic mood. Full Affect.

## 2019-07-30 NOTE — Patient Instructions (Signed)

## 2019-08-05 ENCOUNTER — Other Ambulatory Visit: Payer: Self-pay | Admitting: Family Medicine

## 2019-08-07 ENCOUNTER — Other Ambulatory Visit: Payer: Self-pay | Admitting: Interventional Cardiology

## 2019-08-07 MED ORDER — EZETIMIBE 10 MG PO TABS: 10.0000 mg | ORAL_TABLET | Freq: Every day | ORAL | 3 refills | Status: DC

## 2019-09-16 DIAGNOSIS — Z03818 Encounter for observation for suspected exposure to other biological agents ruled out: Secondary | ICD-10-CM | POA: Diagnosis not present

## 2019-09-30 ENCOUNTER — Telehealth: Payer: Self-pay | Admitting: Family Medicine

## 2019-09-30 NOTE — Telephone Encounter (Signed)
Patient called requested OV for Rt ankle pain w/swelling, says injured for 2 month but thought it would be better by now ---advised no  Provider appts today & if X-ray needed no tech to perform --suggested pt go to nearest [facility] for immediate medical attention or to an Orthopaedic provider for care/ treatment.  --Forwarding message as an fyi to med asst.  -glh

## 2019-10-02 ENCOUNTER — Other Ambulatory Visit: Payer: Self-pay

## 2019-10-02 ENCOUNTER — Ambulatory Visit: Payer: Medicare Other | Admitting: Orthopaedic Surgery

## 2019-10-02 ENCOUNTER — Encounter: Payer: Self-pay | Admitting: Orthopaedic Surgery

## 2019-10-02 ENCOUNTER — Ambulatory Visit: Payer: Self-pay

## 2019-10-02 DIAGNOSIS — M25571 Pain in right ankle and joints of right foot: Secondary | ICD-10-CM | POA: Diagnosis not present

## 2019-10-02 MED ORDER — MELOXICAM 15 MG PO TABS: 15.0000 mg | ORAL_TABLET | Freq: Every day | ORAL | 1 refills | Status: DC

## 2019-10-02 MED ORDER — METHYLPREDNISOLONE 4 MG PO TABS: ORAL_TABLET | ORAL | 0 refills | Status: DC

## 2019-10-02 NOTE — Progress Notes (Signed)
Office Visit Note   Patient: Lori Montoya           Date of Birth: 08-17-1951           MRN: [id number] Visit Date: 10/02/2019              Requested by: Mellody Dance, DO [place],  [place] [id number] PCP: Mellody Dance, DO   Assessment & Plan: Visit Diagnoses:  1. Pain in right ankle and joints of right foot     Plan: There is definitely an inflammatory process going on with her right ankle.  There is likely some type of stress response given the amount of exercising she does.  I want her to back off some of the walking and will try an ASO.  I am also going to try a 6-day steroid taper combined with Voltaren gel and meloxicam.  I would like to see her back in 4 weeks to see how she is doing overall.  Certainly this could be a stress fracture as well.  We may end up needing to mobilize her and a walking boot if this does not subside when I see her at the next visit.  Follow-Up Instructions: Return in about 4 weeks (around 10/30/2019).   Orders:  Orders Placed This Encounter  Procedures  . XR Ankle Complete Right   Meds ordered this encounter  Medications  . methylPREDNISolone (MEDROL) 4 MG tablet    Sig: Medrol dose pack. Take as instructed    Dispense:  21 tablet    Refill:  0  . meloxicam (MOBIC) 15 MG tablet    Sig: Take 1 tablet (15 mg total) by mouth daily.    Dispense:  30 tablet    Refill:  1      Procedures: No procedures performed   Clinical Data: No additional findings.   Subjective: Chief Complaint  Patient presents with  . Right Ankle - Pain  The patient is a very pleasant 68 year old female who comes in with right ankle pain.  She denies any specific injuries but she does do a lot of exercise walking.  She walks about 3 miles a day.  She says she can walk fine but it is been painful to the touch and swollen and she points to the lateral aspect of her right ankle source of her pain.  This is been going on for about 5 to 6 weeks  now.  She is not diabetic.  She does have a history of COPD.  She is not on any type of regular anti-inflammatory medications.  HPI  Review of Systems She currently denies any headache, chest pain, shortness of breath, fever, chills, nausea, vomiting  Objective: Vital Signs: There were no vitals taken for this visit.  Physical Exam Ortho Exam She is alert and oriented x3 and in no acute distress.  Examination of her right ankle shows full range of motion with lateral ankle pain.  There is swelling of the lateral ankle comparing the left and right ankles.  Her Achilles is intact with a negative Thompson test.  There is no pain medially.  The ankle is ligamentously stable.  There is significant tenderness to palpation along the lateral malleolus and the anterior talofibular ligament area of the ankle. Specialty Comments:  No specialty comments available.  Imaging: Xr Ankle Complete Right  Result Date: 10/02/2019 3 views of the right ankle show no acute findings.  The ankle joint is well located.  There is  no evidence of fracture.  There is no significant effusion or soft tissue swelling.  The bone is osteopenic.    PMFS History: Patient Active Problem List   Diagnosis Date Noted  . Adjustment disorder with mixed anxiety and depressed mood 01/15/2019  . Sore throat 10/29/2018  . Gastroesophageal reflux disease without esophagitis 09/13/2018  . Elevated LDL cholesterol level 05/13/2018  . Dysfunction of eustachian tube 05/13/2018  . Hypertriglyceridemia 05/13/2018  . Varicose veins of both legs with edema 05/13/2018  . Status post breast reduction 03/19/2018  . Sclerosing adenosis of breast, right 03/05/2018  . Fibrocystic breast changes, bilateral 03/05/2018  . Elevated liver enzymes 09/17/2017  . Other fatigue 02/20/2017  . Myalgia 02/20/2017  . Vitamin D insufficiency 11/03/2016  . Smoking greater than 30 pack years-  quit Dec 2015 09/12/2016  . Hypothyroidism 09/12/2016  .  Hyperlipidemia 09/12/2016  . COPD (chronic obstructive pulmonary disease) ? 09/12/2016  . Degenerative disc disease, lumbar 09/12/2016  . GERD (gastroesophageal reflux disease) 09/12/2016  . Osteoporosis 09/12/2016  . Elevated alkaline phosphatase level 09/12/2016  . Rosacea 09/12/2016  . 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 Intermittent palpitations 07/20/2014  . Frequent unifocal PVCs 07/20/2014   Past Medical History:  Diagnosis Date  . Elevated alkaline phosphatase level   . GERD (gastroesophageal reflux disease)   . Hyperlipidemia   . Osteoporosis   . Other and unspecified disc disorder of lumbar region   . Personal history of other disorders of nervous system and sense organs   . Seizures ([place])    last seizure in 1977  . Tobacco use disorder   . Unspecified hypothyroidism     Family History  Problem Relation Age of Onset  . Hyperlipidemia Mother   . Hypertension Mother   . CVA Mother   . Alcohol abuse Mother   . Dementia Mother   . Hypothyroidism Mother   . Cancer - Other Father        liver  . Alcohol abuse Brother   . ALS Brother   . Dementia Maternal Grandmother   . Stroke Maternal Grandfather   . Breast cancer Paternal Aunt        unsure of age  . Breast cancer Paternal Aunt        unsure of age    Past Surgical History:  Procedure Laterality Date  . BREAST EXCISIONAL BIOPSY Right    benign  . BREAST LUMPECTOMY WITH RADIOACTIVE SEED LOCALIZATION Right 02/28/2018   Procedure: RIGHT BREAST LUMPECTOMY WITH RADIOACTIVE SEED LOCALIZATION ERAS PATHWAY;  Surgeon: Erroll Luna, MD;  Location: [place];  Service: General;  Laterality: Right;  . BREAST REDUCTION SURGERY Bilateral 02/28/2018   Procedure: BILATERA BREAST REDUCTION FOR SYMMERTY;  Surgeon: Wallace Going, DO;  Location: [place];  Service: Plastics;  Laterality: Bilateral;  . CHOLECYSTECTOMY  2011   lap choli  . COLONOSCOPY    . MICROLARYNGOSCOPY  Left 01/01/2015   Procedure: MICROLARYNGOSCOPY WITH EXCISION OF LEFT VOCAL CORD NODULE ;  Surgeon: Jerrell Belfast, MD;  Location: [place];  Service: ENT;  Laterality: Left;  . REDUCTION MAMMAPLASTY Left    Social History   Occupational History  . Not on file  Tobacco Use  . Smoking status: Former Smoker    Packs/day: 1.00    Years: 48.00    Pack years: 48.00    Types: Cigarettes    Quit date: 10/31/2014    Years since quitting:  4.9  . Smokeless tobacco: Never Used  . Tobacco comment: havent smoked in three weeks  Substance and Sexual Activity  . Alcohol use: No  . Drug use: No  . Sexual activity: Never    Comment: last cig 10/31/14

## 2019-10-20 ENCOUNTER — Ambulatory Visit (INDEPENDENT_AMBULATORY_CARE_PROVIDER_SITE_OTHER): Payer: Medicare Other | Admitting: Family Medicine

## 2019-10-20 ENCOUNTER — Other Ambulatory Visit: Payer: Self-pay

## 2019-10-20 ENCOUNTER — Other Ambulatory Visit: Payer: Medicare Other

## 2019-10-20 ENCOUNTER — Encounter: Payer: Self-pay | Admitting: Family Medicine

## 2019-10-20 VITALS — BP 118/73 | HR 89 | Ht 67.0 in | Wt 158.6 lb

## 2019-10-20 DIAGNOSIS — M810 Age-related osteoporosis without current pathological fracture: Secondary | ICD-10-CM | POA: Diagnosis not present

## 2019-10-20 DIAGNOSIS — E7800 Pure hypercholesterolemia, unspecified: Secondary | ICD-10-CM

## 2019-10-20 DIAGNOSIS — E559 Vitamin D deficiency, unspecified: Secondary | ICD-10-CM

## 2019-10-20 DIAGNOSIS — E785 Hyperlipidemia, unspecified: Secondary | ICD-10-CM

## 2019-10-20 DIAGNOSIS — E039 Hypothyroidism, unspecified: Secondary | ICD-10-CM | POA: Diagnosis not present

## 2019-10-20 DIAGNOSIS — E2839 Other primary ovarian failure: Secondary | ICD-10-CM | POA: Diagnosis not present

## 2019-10-20 DIAGNOSIS — E781 Pure hyperglyceridemia: Secondary | ICD-10-CM

## 2019-10-20 DIAGNOSIS — Z780 Asymptomatic menopausal state: Secondary | ICD-10-CM | POA: Diagnosis not present

## 2019-10-20 DIAGNOSIS — Z0000 Encounter for general adult medical examination without abnormal findings: Secondary | ICD-10-CM

## 2019-10-20 DIAGNOSIS — R748 Abnormal levels of other serum enzymes: Secondary | ICD-10-CM

## 2019-10-20 NOTE — Progress Notes (Signed)
Subjective:   Lori Montoya is a 68 y.o. female who presents for Medicare Annual (Subsequent) preventive examination.  HPI: Denies difficulty understanding soft or whispered voices. Denies concerns about hearing in general. Says "I hear very well." Confirms that she experiences ringing in the ears, but denies concerns with this. Does not think this has worsened over the years. Denies concerns with memory. Says she plays "a lot of those games you're supposed to play, word search puzzles." Denies falling. Quit smoking in December of 2015. Has a 40+ pack-year history. Confirms smoking about a pack per day when she smoked. Started smoking around age 12-18. She was walking 3 miles daily "until my ankle messed up." Not walking currently. She is following up with Dr. Ninfa Linden of Orthopedics. "He X-rayed it and said it wasn't broken, but something is definitely going on." She was placed on steroid and meloxicam, and was told to get a cream at the grocery store. She hasn't taken the meloxicam a lot. Says she "got it filled and took it a few times, but I didn't really see the difference." Her ankle is swollen, red, and hurts if she mashes on it. She returns for follow-up with orthopedics on the 10th (of December).     Review of Systems:      Objective:     Vitals: Ht 5\' 7"  (1.702 m)   Wt 157 lb 9.6 oz (71.5 kg)   BMI 24.68 kg/m   Body mass index is 24.68 kg/m.  Advanced Directives 02/28/2018 02/19/2018 05/16/2017 12/30/2014  Does Patient Have a Medical Advance Directive? No No No No  Would patient like information on creating a medical advance directive? No - Patient declined - No - Patient declined No - patient declined information    Tobacco Social History   Tobacco Use  Smoking Status Former Smoker  . Packs/day: 1.00  . Years: 48.00  . Pack years: 48.00  . Types: Cigarettes  . Quit date: 10/31/2014  . Years since quitting: 4.9  Smokeless Tobacco Never Used  Tobacco Comment   havent  smoked in three weeks     Counseling given: Not Answered Comment: havent smoked in three weeks    Past Medical History:  Diagnosis Date  . Elevated alkaline phosphatase level   . GERD (gastroesophageal reflux disease)   . Hyperlipidemia   . Osteoporosis   . Other and unspecified disc disorder of lumbar region   . Personal history of other disorders of nervous system and sense organs   . Seizures ([place])    last seizure in 1977  . Tobacco use disorder   . Unspecified hypothyroidism    Past Surgical History:  Procedure Laterality Date  . BREAST EXCISIONAL BIOPSY Right    benign  . BREAST LUMPECTOMY WITH RADIOACTIVE SEED LOCALIZATION Right 02/28/2018   Procedure: RIGHT BREAST LUMPECTOMY WITH RADIOACTIVE SEED LOCALIZATION ERAS PATHWAY;  Surgeon: Erroll Luna, MD;  Location: [facility];  Service: General;  Laterality: Right;  . BREAST REDUCTION SURGERY Bilateral 02/28/2018   Procedure: BILATERA BREAST REDUCTION FOR SYMMERTY;  Surgeon: Wallace Going, DO;  Location: [facility];  Service: Plastics;  Laterality: Bilateral;  . CHOLECYSTECTOMY  2011   lap choli  . COLONOSCOPY    . MICROLARYNGOSCOPY Left 01/01/2015   Procedure: MICROLARYNGOSCOPY WITH EXCISION OF LEFT VOCAL CORD NODULE ;  Surgeon: Jerrell Belfast, MD;  Location: [facility];  Service: ENT;  Laterality: Left;  . REDUCTION MAMMAPLASTY Left  Family History  Problem Relation Age of Onset  . Hyperlipidemia Mother   . Hypertension Mother   . CVA Mother   . Alcohol abuse Mother   . Dementia Mother   . Hypothyroidism Mother   . Cancer - Other Father        liver  . Alcohol abuse Brother   . ALS Brother   . Dementia Maternal Grandmother   . Stroke Maternal Grandfather   . Breast cancer Paternal Aunt        unsure of age  . Breast cancer Paternal Aunt        unsure of age   Social History   Socioeconomic History  . Marital status: Widowed    Spouse name: Not on file   . Number of children: Not on file  . Years of education: Not on file  . Highest education level: Not on file  Occupational History  . Not on file  Social Needs  . Financial resource strain: Not on file  . Food insecurity    Worry: Not on file    Inability: Not on file  . Transportation needs    Medical: Not on file    Non-medical: Not on file  Tobacco Use  . Smoking status: Former Smoker    Packs/day: 1.00    Years: 48.00    Pack years: 48.00    Types: Cigarettes    Quit date: 10/31/2014    Years since quitting: 4.9  . Smokeless tobacco: Never Used  . Tobacco comment: havent smoked in three weeks  Substance and Sexual Activity  . Alcohol use: No  . Drug use: No  . Sexual activity: Never    Comment: last cig 10/31/14  Lifestyle  . Physical activity    Days per week: Not on file    Minutes per session: Not on file  . Stress: Not on file  Relationships  . Social Herbalist on phone: Not on file    Gets together: Not on file    Attends religious service: Not on file    Active member of club or organization: Not on file    Attends meetings of clubs or organizations: Not on file    Relationship status: Not on file  Other Topics Concern  . Not on file  Social History Narrative  . Not on file    Outpatient Encounter Medications as of 10/20/2019  Medication Sig  . Ascorbic Acid (VITAMIN C) 500 MG CHEW   . Calcium Carbonate ([employer]S CALCIUM) 1500 MG TABS Take by mouth.  . Cholecalciferol (VITAMIN D3) 1000 units CAPS Take 5 capsules (5,000 Units total) by mouth daily.  Marland Kitchen ezetimibe (ZETIA) 10 MG tablet Take 1 tablet (10 mg total) by mouth daily.  Marland Kitchen levothyroxine (SYNTHROID) 100 MCG tablet Take 1 tablet (100 mcg total) by mouth daily. Needs office visit for refills  . meloxicam (MOBIC) 15 MG tablet Take 1 tablet (15 mg total) by mouth daily. (Patient not taking: Reported on 10/20/2019)  . [DISCONTINUED] methylPREDNISolone (MEDROL) 4 MG tablet Medrol dose pack. Take  as instructed (Patient not taking: Reported on 10/20/2019)   No facility-administered encounter medications on file as of 10/20/2019.     Activities of Daily Living In your present state of health, do you have any difficulty performing the following activities: 10/20/2019  Hearing? N  Vision? N  Difficulty concentrating or making decisions? N  Walking or climbing stairs? N  Dressing or bathing? N  Doing errands, shopping?  N  Some recent data might be hidden    Patient Care Team: Mellody Dance, DO as PCP - General (Family Medicine) Teena Irani, MD (Inactive) as Consulting Physician (Gastroenterology) Jerrell Belfast, MD as Consulting Physician (Otolaryngology) Darlin Coco, MD (Cardiology) Dillingham, Loel Lofty, DO as Attending Physician (Plastic Surgery)    Assessment:   This is a routine wellness examination for Ilean.  Exercise Activities and Dietary recommendations    Goals   None     Fall Risk Fall Risk  10/20/2019 05/13/2018 01/14/2018 09/17/2017 05/16/2017  Falls in the past year? 0 No No No No  Number falls in past yr: 0 - - - -  Injury with Fall? 0 - - - -  Follow up Falls evaluation completed - - - -   Is the patient's home free of loose throw rugs in walkways, pet beds, electrical cords, etc?   yes      Grab bars in the bathroom? yes      Handrails on the stairs?   yes      Adequate lighting?   yes   Timed Get Up and Go performed: Not performed-Telehealth apt   Depression Screen PHQ 2/9 Scores 10/20/2019 07/30/2019 01/15/2019 10/29/2018  PHQ - 2 Score 0 0 0 0  PHQ- 9 Score 1 0 3 1     Cognitive Function 6CIT Screen 10/20/2019 05/16/2017  What Year? 0 points 0 points  What month? 0 points 0 points  What time? 0 points 0 points  Count back from 20 0 points 0 points  Months in reverse 0 points 0 points  Repeat phrase 0 points 0 points  Total Score 0 0     Immunization History  Administered Date(s) Administered  . Fluad Quad(high Dose 65+)  07/30/2019  . Influenza, High Dose Seasonal PF 09/12/2016, 08/27/2017, 08/27/2018  . Pneumococcal Conjugate-13 07/31/2016  . Pneumococcal Polysaccharide-23 08/14/2009, 09/17/2017  . Tdap 06/04/2013  . Zoster 12/07/2010  . Zoster Recombinat (Shingrix) 01/14/2018, 05/16/2018    Qualifies for Shingles Vaccine?Patient is UTD on Shingles Vaccine Patient is UTD on Pneumococcal vaccine Patient is UTD on Influenza vaccine   Screening Tests Health Maintenance  Topic Date Due  . MAMMOGRAM  05/18/2021  . TETANUS/TDAP  06/05/2023  . COLONOSCOPY  06/01/2024  . INFLUENZA VACCINE  Completed  . DEXA SCAN  Completed  . Hepatitis C Screening  Completed  . PNA vac Low Risk Adult  Completed    Cancer Screenings: Lung: Low Dose CT Chest recommended if Age 29-80 years, 30 pack-year currently smoking OR have quit w/in 15years. Patient does qualify. Patient declined Breast:  Up to date on Mammogram? Yes   Up to date of Bone Density/Dexa? No- Ordered Colorectal: 06/01/2014-not needed for repeat for 10 years  Additional Screenings:  Hepatitis C Screening: performed 08/01/2016      Plan:     PLAN: - Last mammogram done 05/19/2019. - Pap smear up to date and patient aged out for future assessment. - Last colonoscopy obtained in 2015, repeat in 10 years. - Last TDAP obtained in 2014. - Shingles vaccine UTD. - Pneumonia vaccine UTD. - Influenza vaccination obtained. - Per patient, quit smoking October 31, 2014. 40 pack-year history. - Strongly recommended low-dose CT screen for lung cancer today. - Patient declines low-dose CT screen today. She understands risks of declining assessment. - Education provided and all questions answered. - Need for DEXA screen. - To help prevent memory loss, discussed importance of regular exercise, prudent habits such as adequate  sleep, healthy high antioxidant diet. Encouraged patient to continue honing her mind using word games, puzzles, learning new languages, learning a new  instrument, etc. - Extensive education provided and all questions answered. - Lab work obtained today. - Patient will return in 4-6 months as advised.   I have personally reviewed and noted the following in the patient's chart:   . Medical and social history . Use of alcohol, tobacco or illicit drugs  . Current medications and supplements . Functional ability and status . Nutritional status . Physical activity . Advanced directives . List of other physicians . Hospitalizations, surgeries, and ER visits in previous 12 months . Vitals . Screenings to include cognitive, depression, and falls . Referrals and appointments  In addition, I have reviewed and discussed with patient certain preventive protocols, quality metrics, and best practice recommendations. A written personalized care plan for preventive services as well as general preventive health recommendations were provided to patient.     Mellody Dance, DO  10/20/2019

## 2019-10-21 LAB — COMPREHENSIVE METABOLIC PANEL
ALT: 28 IU/L (ref 0–32)
AST: 36 IU/L (ref 0–40)
Albumin/Globulin Ratio: 1.6 (ref 1.2–2.2)
Albumin: 4.3 g/dL (ref 3.8–4.8)
Alkaline Phosphatase: 180 IU/L — ABNORMAL HIGH (ref 39–117)
BUN/Creatinine Ratio: 18 (ref 12–28)
BUN: 14 mg/dL (ref 8–27)
Bilirubin Total: 0.5 mg/dL (ref 0.0–1.2)
CO2: 24 mmol/L (ref 20–29)
Calcium: 9.6 mg/dL (ref 8.7–10.3)
Chloride: 103 mmol/L (ref 96–106)
Creatinine, Ser: 0.79 mg/dL (ref 0.57–1.00)
GFR calc Af Amer: 89 mL/min/{1.73_m2} (ref >59)
GFR calc non Af Amer: 77 mL/min/{1.73_m2} (ref >59)
Globulin, Total: 2.7 g/dL (ref 1.5–4.5)
Glucose: 81 mg/dL (ref 65–99)
Potassium: 4.1 mmol/L (ref 3.5–5.2)
Sodium: 141 mmol/L (ref 134–144)
Total Protein: 7 g/dL (ref 6.0–8.5)

## 2019-10-21 LAB — T3: T3, Total: 98 ng/dL (ref 71–180)

## 2019-10-21 LAB — CBC WITH DIFFERENTIAL/PLATELET
Basophils Absolute: 0 10*3/uL (ref 0.0–0.2)
Basos: 1 %
EOS (ABSOLUTE): 0.3 10*3/uL (ref 0.0–0.4)
Eos: 4 %
Hematocrit: 41.5 % (ref 34.0–46.6)
Hemoglobin: 13.9 g/dL (ref 11.1–15.9)
Immature Grans (Abs): 0 10*3/uL (ref 0.0–0.1)
Immature Granulocytes: 0 %
Lymphocytes Absolute: 1.8 10*3/uL (ref 0.7–3.1)
Lymphs: 30 %
MCH: 30.1 pg (ref 26.6–33.0)
MCHC: 33.5 g/dL (ref 31.5–35.7)
MCV: 90 fL (ref 79–97)
Monocytes Absolute: 0.7 10*3/uL (ref 0.1–0.9)
Monocytes: 12 %
Neutrophils Absolute: 3.2 10*3/uL (ref 1.4–7.0)
Neutrophils: 53 %
Platelets: 218 10*3/uL (ref 150–450)
RBC: 4.62 x10E6/uL (ref 3.77–5.28)
RDW: 13.1 % (ref 11.7–15.4)
WBC: 6 10*3/uL (ref 3.4–10.8)

## 2019-10-21 LAB — LIPID PANEL
Chol/HDL Ratio: 5.4 ratio — ABNORMAL HIGH (ref 0.0–4.4)
Cholesterol, Total: 236 mg/dL — ABNORMAL HIGH (ref 100–199)
HDL: 44 mg/dL (ref >39)
LDL Chol Calc (NIH): 167 mg/dL — ABNORMAL HIGH (ref 0–99)
Triglycerides: 136 mg/dL (ref 0–149)
VLDL Cholesterol Cal: 25 mg/dL (ref 5–40)

## 2019-10-21 LAB — HEMOGLOBIN A1C
Est. average glucose Bld gHb Est-mCnc: 103 mg/dL
Hgb A1c MFr Bld: 5.2 % (ref 4.8–5.6)

## 2019-10-21 LAB — TSH: TSH: 2.94 u[IU]/mL (ref 0.450–4.500)

## 2019-10-21 LAB — T4, FREE: Free T4: 1.46 ng/dL (ref 0.82–1.77)

## 2019-10-21 LAB — VITAMIN D 25 HYDROXY (VIT D DEFICIENCY, FRACTURES): Vit D, 25-Hydroxy: 50 ng/mL (ref 30.0–100.0)

## 2019-10-24 ENCOUNTER — Other Ambulatory Visit: Payer: Medicare Other

## 2019-10-30 ENCOUNTER — Other Ambulatory Visit: Payer: Self-pay

## 2019-10-30 ENCOUNTER — Telehealth: Payer: Self-pay | Admitting: Family Medicine

## 2019-10-30 ENCOUNTER — Ambulatory Visit: Payer: Medicare Other | Admitting: Orthopaedic Surgery

## 2019-10-30 ENCOUNTER — Encounter: Payer: Self-pay | Admitting: Orthopaedic Surgery

## 2019-10-30 DIAGNOSIS — M25571 Pain in right ankle and joints of right foot: Secondary | ICD-10-CM | POA: Diagnosis not present

## 2019-10-30 DIAGNOSIS — E785 Hyperlipidemia, unspecified: Secondary | ICD-10-CM

## 2019-10-30 MED ORDER — ROSUVASTATIN CALCIUM 5 MG PO TABS: 5.0000 mg | ORAL_TABLET | Freq: Every day | ORAL | 1 refills | Status: DC

## 2019-10-30 NOTE — Telephone Encounter (Signed)
Medication sent to pharmacy. Future fasting labs ordered.  Left message on patient voicemail with this information and to call for any questions. AS, CMA

## 2019-10-30 NOTE — Progress Notes (Signed)
Patient is a 68 year old well-known to me.  She is still having right ankle swelling and pain in some information.  She is try to back off of activities.  Feels a little bit better than it did at her last visit.  On exam there is still some swelling in the ankle itself.  There is anterior lateral tenderness and some tenderness into the midfoot.  Her range of motion is full but painful.  X-rays of the last visit were negative for any type of fracture.  At this point I would like to more so shot her down in terms of exercises.  I would like to put her in a short walking boot and have her weight-bear as tolerated in the boot.  We will see her back in 3 weeks to see how she is doing overall.  All question concerns were answered and addressed.

## 2019-10-30 NOTE — Addendum Note (Signed)
Addended by: Mickel Crow on: 10/30/2019 01:29 PM   Modules accepted: Orders

## 2019-10-30 NOTE — Telephone Encounter (Signed)
Crestor 5mg   1 po q hs #90, 1 rf  Repeat ALT, AST in [redacted] wks along with FLP

## 2019-10-30 NOTE — Telephone Encounter (Signed)
Patient called back is willing to start Crestor per PCP recommendation. She states she wants to have her liver enzymes checked ASAP while on it as she is concerned about those levels. Also, she wants to start on the lowest dose possible. Please send order to [facility].

## 2019-11-20 ENCOUNTER — Other Ambulatory Visit: Payer: Self-pay

## 2019-11-20 ENCOUNTER — Ambulatory Visit (INDEPENDENT_AMBULATORY_CARE_PROVIDER_SITE_OTHER): Payer: Medicare Other | Admitting: Orthopaedic Surgery

## 2019-11-20 ENCOUNTER — Encounter: Payer: Self-pay | Admitting: Orthopaedic Surgery

## 2019-11-20 ENCOUNTER — Other Ambulatory Visit: Payer: Self-pay | Admitting: Radiology

## 2019-11-20 DIAGNOSIS — M25571 Pain in right ankle and joints of right foot: Secondary | ICD-10-CM

## 2019-11-20 NOTE — Progress Notes (Signed)
HPI: Lori Montoya returns today follow-up of her right ankle foot pain.  She continues to wear the cam walker boot.  She states that her ankle and foot pain is still same.  She has been wearing remarkably.  She tried Voltaren gel Mobic without any real relief.  This has been ongoing for at least last 2 months.  She denies any known injury to the foot or ankle.  Her pain 2 months ago began the lateral aspect of the right ankle.  Review of systems: See HPI otherwise negative or noncontributory.  Physical exam: Right ankle full dorsiflexion plantarflexion without pain.  There is no abnormal warmth erythema about the right ankle.  She has positive edema over the lateral malleolus.  She is nontender.  Distal fibula.  Tenderness in the anterolateral ankle down to the sinus Tarsi region.  Tenderness over the third and fourth metatarsal heads.  The remainder the foot is nontender.  There is no abnormal warmth erythema ecchymosis or edema right foot.  Right calf supple nontender.  She is nontender to the posterior tibial tendon and peroneal tendons.  5 strength with inversion eversion against resistance without pain.  Impression: Right ankle and foot pain x2 months  Plan: We will obtain an MRI of the right ankle to rule out stress fracture or tendinopathy. She will follow-up with [place] after the MRI to go over the results and discuss further treatment.  Continue the cam walker boot.  questions were encouraged and answered by Dr. Ninfa Linden and myself.

## 2019-12-04 ENCOUNTER — Other Ambulatory Visit: Payer: Medicare Other

## 2019-12-10 ENCOUNTER — Ambulatory Visit: Payer: Medicare Other | Admitting: Orthopaedic Surgery

## 2019-12-10 ENCOUNTER — Other Ambulatory Visit: Payer: Self-pay

## 2019-12-10 ENCOUNTER — Other Ambulatory Visit: Payer: Medicare Other

## 2019-12-10 DIAGNOSIS — E785 Hyperlipidemia, unspecified: Secondary | ICD-10-CM

## 2019-12-12 ENCOUNTER — Ambulatory Visit
Admission: RE | Admit: 2019-12-12 | Discharge: 2019-12-12 | Disposition: A | Discharge status: Home / Self Care | Payer: Medicare Other | Source: Ambulatory Visit | Attending: Orthopaedic Surgery | Admitting: Orthopaedic Surgery

## 2019-12-12 ENCOUNTER — Other Ambulatory Visit: Payer: Self-pay | Admitting: Family Medicine

## 2019-12-12 ENCOUNTER — Other Ambulatory Visit: Payer: Self-pay

## 2019-12-12 DIAGNOSIS — E785 Hyperlipidemia, unspecified: Secondary | ICD-10-CM

## 2019-12-12 DIAGNOSIS — M25571 Pain in right ankle and joints of right foot: Secondary | ICD-10-CM

## 2019-12-12 DIAGNOSIS — R600 Localized edema: Secondary | ICD-10-CM | POA: Diagnosis not present

## 2019-12-12 LAB — ALT: ALT: 25 IU/L (ref 0–32)

## 2019-12-12 LAB — AST: AST: 32 [IU]/L (ref 0–40)

## 2019-12-12 LAB — LIPID PANEL
Chol/HDL Ratio: 3.9 ratio (ref 0.0–4.4)
Cholesterol, Total: 166 mg/dL (ref 100–199)
HDL: 43 mg/dL (ref >39)
LDL Chol Calc (NIH): 98 mg/dL (ref 0–99)
Triglycerides: 142 mg/dL (ref 0–149)
VLDL Cholesterol Cal: 25 mg/dL (ref 5–40)

## 2019-12-12 MED ORDER — ROSUVASTATIN CALCIUM 20 MG PO TABS: 10.0000 mg | ORAL_TABLET | Freq: Every day | ORAL | 1 refills | Status: DC

## 2019-12-17 ENCOUNTER — Ambulatory Visit: Payer: Medicare Other | Admitting: Orthopaedic Surgery

## 2019-12-17 ENCOUNTER — Encounter: Payer: Self-pay | Admitting: Orthopaedic Surgery

## 2019-12-17 ENCOUNTER — Other Ambulatory Visit: Payer: Self-pay

## 2019-12-17 DIAGNOSIS — M25571 Pain in right ankle and joints of right foot: Secondary | ICD-10-CM

## 2019-12-17 NOTE — Progress Notes (Signed)
The patient is a 69 year old female who comes in to go over an MRI of her right foot and ankle.  She has been dealing with chronic right ankle pain since about October of this past year.  There has been lateral ankle pain and swelling.  After the failure conservative treatment we have sent her for an MRI.  She does report some improvement in her symptoms.  On exam she hurts some over the sinus tarsus area of the lateral foot on the right side.  There is some slight Achilles pain but there is also anterior pain.  The swelling that I seen before has gone down quite a bit.  MRIs reviewed with her.  There is edema in the cuboid suggesting a stress response or even a stress fracture.  There is also some mild tendinosis of the Achilles tendon.  The remainder of the MRI is entirely normal.  I went over with her in detail.  I do feel that the fact that she is getting better and feeling better is a good sign.  She is in flip-flops today but understands she should really not wear any open back shoes.  This should continue to run its course and get better with time.  I have no other recommendations for her and she understands this as well.  I am encouraged by the fact that she is feeling a little better.  All questions concerns were answered and addressed.  At this point follow-up is as needed.

## 2020-01-14 ENCOUNTER — Other Ambulatory Visit: Payer: Medicare Other

## 2020-02-02 ENCOUNTER — Other Ambulatory Visit: Payer: Self-pay | Admitting: Family Medicine

## 2020-02-05 ENCOUNTER — Other Ambulatory Visit: Payer: Medicare Other

## 2020-02-05 ENCOUNTER — Other Ambulatory Visit: Payer: Self-pay

## 2020-02-05 DIAGNOSIS — E785 Hyperlipidemia, unspecified: Secondary | ICD-10-CM | POA: Diagnosis not present

## 2020-02-06 LAB — AST: AST: 31 IU/L (ref 0–40)

## 2020-02-06 LAB — LIPID PANEL
Chol/HDL Ratio: 5.8 ratio — ABNORMAL HIGH (ref 0.0–4.4)
Cholesterol, Total: 249 mg/dL — ABNORMAL HIGH (ref 100–199)
HDL: 43 mg/dL (ref >39)
LDL Chol Calc (NIH): 182 mg/dL — ABNORMAL HIGH (ref 0–99)
Triglycerides: 131 mg/dL (ref 0–149)
VLDL Cholesterol Cal: 24 mg/dL (ref 5–40)

## 2020-02-06 LAB — ALT: ALT: 25 IU/L (ref 0–32)

## 2020-03-15 ENCOUNTER — Other Ambulatory Visit: Payer: Self-pay

## 2020-03-15 ENCOUNTER — Ambulatory Visit
Admission: RE | Admit: 2020-03-15 | Discharge: 2020-03-15 | Disposition: A | Discharge status: Home / Self Care | Payer: Medicare Other | Source: Ambulatory Visit | Attending: Family Medicine | Admitting: Family Medicine

## 2020-03-15 DIAGNOSIS — M810 Age-related osteoporosis without current pathological fracture: Secondary | ICD-10-CM | POA: Diagnosis not present

## 2020-03-15 DIAGNOSIS — Z780 Asymptomatic menopausal state: Secondary | ICD-10-CM

## 2020-03-15 DIAGNOSIS — E2839 Other primary ovarian failure: Secondary | ICD-10-CM

## 2020-03-16 ENCOUNTER — Telehealth: Payer: Self-pay | Admitting: Family Medicine

## 2020-03-16 MED ORDER — IBANDRONATE SODIUM 150 MG PO TABS: 150.0000 mg | ORAL_TABLET | ORAL | 0 refills | Status: DC

## 2020-03-16 NOTE — Telephone Encounter (Signed)
-----   Message from Mellody Dance, DO sent at 03/15/2020  8:13 PM EDT ----- 1st line treatment for osteoporosis is a bisphosphonate.   I rec Boniva 150mg  PO q monthly.  Disp: 6, no RF as pt will need f/up ov with incoming physician/ or other provider for any RF's since this is a new med.   If pt would like to discuss options in further detail, have her make appt with incoming doctor to address.

## 2020-03-22 ENCOUNTER — Ambulatory Visit (INDEPENDENT_AMBULATORY_CARE_PROVIDER_SITE_OTHER): Payer: Medicare Other | Admitting: Physician Assistant

## 2020-03-22 ENCOUNTER — Other Ambulatory Visit: Payer: Self-pay

## 2020-03-22 ENCOUNTER — Encounter: Payer: Self-pay | Admitting: Physician Assistant

## 2020-03-22 VITALS — BP 129/72 | HR 77 | Temp 98.0°F | Ht 67.0 in | Wt 155.3 lb

## 2020-03-22 DIAGNOSIS — E7800 Pure hypercholesterolemia, unspecified: Secondary | ICD-10-CM | POA: Diagnosis not present

## 2020-03-22 DIAGNOSIS — M810 Age-related osteoporosis without current pathological fracture: Secondary | ICD-10-CM | POA: Diagnosis not present

## 2020-03-22 NOTE — Progress Notes (Signed)
Established Patient Office Visit  Subjective:  Patient ID: Lori Montoya, female    DOB: [birth year]  Age: 69 y.o. MRN: [id number]  CC:  Chief Complaint  Patient presents with  . Results    HPI Lori Montoya presents for discussion of DEXA scan results and management. DEXA scan results revealed a T-score of -3.5 and pt expresses concern of trying Boniva due to side effects (jaw pain) she experienced from Fosamax in the past. She is also concerned about femur fractures. Pt reports she hasn't been taking calcium supplement as recommended but recently started. Also, patient expressed desire to trial Citrus Bergamot for 3 months to see if her cholesterol panel would improve. States she mentioned to previous PCP at office and didn't agree with patient. Pt is not against taking medication but would like to try natural supplement given she had side effects from statin therapy- Crestor.  Past Medical History:  Diagnosis Date  . Elevated alkaline phosphatase level   . GERD (gastroesophageal reflux disease)   . Hyperlipidemia   . Osteoporosis   . Other and unspecified disc disorder of lumbar region   . Personal history of other disorders of nervous system and sense organs   . Seizures ([place])    last seizure in 1977  . Tobacco use disorder   . Unspecified hypothyroidism     Past Surgical History:  Procedure Laterality Date  . BREAST EXCISIONAL BIOPSY Right    benign  . BREAST LUMPECTOMY WITH RADIOACTIVE SEED LOCALIZATION Right 02/28/2018   Procedure: RIGHT BREAST LUMPECTOMY WITH RADIOACTIVE SEED LOCALIZATION ERAS PATHWAY;  Surgeon: Erroll Luna, MD;  Location: [facility];  Service: General;  Laterality: Right;  . BREAST REDUCTION SURGERY Bilateral 02/28/2018   Procedure: BILATERA BREAST REDUCTION FOR SYMMERTY;  Surgeon: Wallace Going, DO;  Location: [facility] [facility];  Service: Plastics;  Laterality: Bilateral;  . CHOLECYSTECTOMY  2011   lap choli  .  COLONOSCOPY    . MICROLARYNGOSCOPY Left 01/01/2015   Procedure: MICROLARYNGOSCOPY WITH EXCISION OF LEFT VOCAL CORD NODULE ;  Surgeon: Jerrell Belfast, MD;  Location: [facility];  Service: ENT;  Laterality: Left;  . REDUCTION MAMMAPLASTY Left     Family History  Problem Relation Age of Onset  . Hyperlipidemia Mother   . Hypertension Mother   . CVA Mother   . Alcohol abuse Mother   . Dementia Mother   . Hypothyroidism Mother   . Cancer - Other Father        liver  . Alcohol abuse Brother   . ALS Brother   . Dementia Maternal Grandmother   . Stroke Maternal Grandfather   . Breast cancer Paternal Aunt        unsure of age  . Breast cancer Paternal Aunt        unsure of age    Social History   Socioeconomic History  . Marital status: Widowed    Spouse name: Not on file  . Number of children: Not on file  . Years of education: Not on file  . Highest education level: Not on file  Occupational History  . Not on file  Tobacco Use  . Smoking status: Former Smoker    Packs/day: 1.00    Years: 48.00    Pack years: 48.00    Types: Cigarettes    Quit date: 10/31/2014    Years since quitting: 5.4  . Smokeless tobacco: Never Used  . Tobacco comment: havent smoked in three  weeks  Substance and Sexual Activity  . Alcohol use: No  . Drug use: No  . Sexual activity: Never    Comment: last cig 10/31/14  Other Topics Concern  . Not on file  Social History Narrative  . Not on file   Social Determinants of Health   Financial Resource Strain:   . Difficulty of Paying Living Expenses:   Food Insecurity:   . Worried About Charity fundraiser in the Last Year:   . Arboriculturist in the Last Year:   Transportation Needs:   . Film/video editor (Medical):   Marland Kitchen Lack of Transportation (Non-Medical):   Physical Activity:   . Days of Exercise per Week:   . Minutes of Exercise per Session:   Stress:   . Feeling of Stress :   Social Connections:   . Frequency of  Communication with Friends and Family:   . Frequency of Social Gatherings with Friends and Family:   . Attends Religious Services:   . Active Member of Clubs or Organizations:   . Attends Archivist Meetings:   Marland Kitchen Marital Status:   Intimate Partner Violence:   . Fear of Current or Ex-Partner:   . Emotionally Abused:   Marland Kitchen Physically Abused:   . Sexually Abused:     Outpatient Medications Prior to Visit  Medication Sig Dispense Refill  . Ascorbic Acid (VITAMIN C) 500 MG CHEW     . Calcium Carbonate (CVS CALCIUM) 1500 MG TABS Take by mouth.    . Cholecalciferol (VITAMIN D3) 1000 units CAPS Take 5 capsules (5,000 Units total) by mouth daily. 60 capsule   . CITRUS BERGAMOT PO Take 1 tablet by mouth 2 (two) times daily.    Marland Kitchen ibandronate (BONIVA) 150 MG tablet Take 1 tablet (150 mg total) by mouth every 30 (thirty) days. Take in the morning with a full glass of water, on an empty stomach, and do not take anything else by mouth or lie down for the next 30 min. 6 tablet 0  . levothyroxine (SYNTHROID) 100 MCG tablet TAKE 1 TABLET BY MOUTH DAILY. NEEDS OFFICE VISIT FOR REFILLS 90 tablet 1  . rosuvastatin (CRESTOR) 20 MG tablet Take 0.5 tablets (10 mg total) by mouth at bedtime. 45 tablet 1  . ezetimibe (ZETIA) 10 MG tablet Take 1 tablet (10 mg total) by mouth daily. 90 tablet 3  . meloxicam (MOBIC) 15 MG tablet Take 1 tablet (15 mg total) by mouth daily. 30 tablet 1   No facility-administered medications prior to visit.    Allergies  Allergen Reactions  . Alendronate Other (See Comments)    Bone pain  . Prozac [Fluoxetine] Other (See Comments)    Headaches and dizziness  . Zetia [Ezetimibe] Other (See Comments)    Feet burning, bilat leg pains, Has, recurrent sinus problems    ROS Review of Systems  A fourteen system review of systems was performed and found to be positive as per HPI.   Objective:    Physical Exam  General: Well nourished, in no apparent distress. Eyes:  PERRLA, EOMs, conjunctiva clr Resp: Respiratory effort- normal, ECTA B/L  Cardio: RRR  Abdomen: no gross distention. Lymphatics:  less 2 sec cap RF M-sk: Full ROM, 5/5 strength, normal gait.  Skin: Warm, dry  Neuro: Alert, Oriented Psych: Normal affect, Insight and Judgment appropriate.   BP 129/72   Pulse 77   Temp 98 F (36.7 C) (Oral)   Ht 5\' 7"  (1.702 m)  Wt 155 lb 4.8 oz (70.4 kg)   SpO2 99% Comment: on RA  BMI 24.32 kg/m  Wt Readings from Last 3 Encounters:  03/22/20 155 lb 4.8 oz (70.4 kg)  10/20/19 158 lb 9.6 oz (71.9 kg)  07/30/19 157 lb 9.6 oz (71.5 kg)     Health Maintenance Due  Topic Date Due  . COVID-19 Vaccine (1) Never done    There are no preventive care reminders to display for this patient.  Lab Results  Component Value Date   TSH 2.940 10/20/2019   Lab Results  Component Value Date   WBC 6.0 10/20/2019   HGB 13.9 10/20/2019   HCT 41.5 10/20/2019   MCV 90 10/20/2019   PLT 218 10/20/2019   Lab Results  Component Value Date   NA 141 10/20/2019   K 4.1 10/20/2019   CO2 24 10/20/2019   GLUCOSE 81 10/20/2019   BUN 14 10/20/2019   CREATININE 0.79 10/20/2019   BILITOT 0.5 10/20/2019   ALKPHOS 180 (H) 10/20/2019   AST 31 02/05/2020   ALT 25 02/05/2020   PROT 7.0 10/20/2019   ALBUMIN 4.3 10/20/2019   CALCIUM 9.6 10/20/2019   Lab Results  Component Value Date   CHOL 249 (H) 02/05/2020   Lab Results  Component Value Date   HDL 43 02/05/2020   Lab Results  Component Value Date   LDLCALC 182 (H) 02/05/2020   Lab Results  Component Value Date   TRIG 131 02/05/2020   Lab Results  Component Value Date   CHOLHDL 5.8 (H) 02/05/2020   Lab Results  Component Value Date   HGBA1C 5.2 10/20/2019      Assessment & Plan:   Problem List Items Addressed This Visit      Musculoskeletal and Integument   Osteoporosis - Primary (Chronic)     Other   Elevated LDL cholesterol level     Osteoporosis: - Discussed with patient first  line treatment are bisphosphonates and the potential side effects, and with T-score of -3.5 benefits outweigh the risks.  - Discussed alternative treatments available such as anabolic agents and Denosumab.  - Encouraged pt to try Boniva and if she starts experiencing any side effects will stop medication and consider other options. - Pt states she will think about starting Boniva since it is once monthly vs weekly like Fosamax. - Continue Calcium and Vitamin D supplements   Elevated LDL: - Discussed with patient safety concerns of bergamot supplement and respect her decision. Advised to stop supplement if she starts experiencing any side effects.  - Will recheck lipid panel in 3 months and pt agreeable to consider medication if cholesterol still elevated. - Will continue to monitor.   No orders of the defined types were placed in this encounter.   Follow-up: Return in about 3 months (around 06/22/2020) for HLD, HTN, and FBW.    Lorrene Reid, PA-C

## 2020-04-20 ENCOUNTER — Ambulatory Visit
Admission: EM | Admit: 2020-04-20 | Discharge: 2020-04-20 | Disposition: A | Discharge status: Home / Self Care | Payer: Medicare Other | Attending: Emergency Medicine | Admitting: Emergency Medicine

## 2020-04-20 ENCOUNTER — Other Ambulatory Visit: Payer: Self-pay

## 2020-04-20 ENCOUNTER — Encounter: Payer: Self-pay | Admitting: Physician Assistant

## 2020-04-20 ENCOUNTER — Encounter: Payer: Self-pay | Admitting: Emergency Medicine

## 2020-04-20 DIAGNOSIS — A6920 Lyme disease, unspecified: Secondary | ICD-10-CM

## 2020-04-20 DIAGNOSIS — W57XXXA Bitten or stung by nonvenomous insect and other nonvenomous arthropods, initial encounter: Secondary | ICD-10-CM

## 2020-04-20 MED ORDER — DOXYCYCLINE HYCLATE 100 MG PO TABS
200.0000 mg | ORAL_TABLET | Freq: Once | ORAL | Status: AC
  Administered 2020-04-20: 200 mg via ORAL

## 2020-04-20 MED ORDER — METHYLPREDNISOLONE SODIUM SUCC 125 MG IJ SOLR
80.0000 mg | Freq: Once | INTRAMUSCULAR | Status: AC
  Administered 2020-04-20: 80 mg via INTRAMUSCULAR

## 2020-04-20 NOTE — ED Triage Notes (Signed)
Pt here for multiple tick bites with rash and itching to back

## 2020-04-20 NOTE — Discharge Instructions (Signed)
Blood work pending: Please check your MyChart for results. Today you were given steroid shot for itching. May take Benadryl additionally at bedtime. You were given prophylactic treatment for Lyme's disease in office (200 mg of doxycycline). Important to follow-up with your PCP for repeat evaluation in 2-3 weeks.

## 2020-04-20 NOTE — ED Provider Notes (Signed)
[facility]    CSN: [id number] Arrival date & time: 04/20/20  1742      History   Chief Complaint Chief Complaint  Patient presents with  . Tick Removal    HPI Lori Montoya is a 69 y.o. female with history of hypothyroidism, seizures, tobacco use, osteoporosis presenting for little tick bites.  Patient brought nymph tick with her.  Does endorse rash and erythema migrans.  States first tick bite was 5/29.  Believes it was attached greater than 2 days.  Endorsing pruritic rash.  No fever, fatigue, malaise, myalgias, arthralgias, headaches.   Past Medical History:  Diagnosis Date  . Elevated alkaline phosphatase level   . GERD (gastroesophageal reflux disease)   . Hyperlipidemia   . Osteoporosis   . Other and unspecified disc disorder of lumbar region   . Personal history of other disorders of nervous system and sense organs   . Seizures ([place])    last seizure in 1977  . Tobacco use disorder   . Unspecified hypothyroidism     Patient Active Problem List   Diagnosis Date Noted  . Adjustment disorder with mixed anxiety and depressed mood 01/15/2019  . Sore throat 10/29/2018  . Gastroesophageal reflux disease without esophagitis 09/13/2018  . Elevated LDL cholesterol level 05/13/2018  . Dysfunction of eustachian tube 05/13/2018  . Hypertriglyceridemia 05/13/2018  . Varicose veins of both legs with edema 05/13/2018  . Status post breast reduction 03/19/2018  . Sclerosing adenosis of breast, right 03/05/2018  . Fibrocystic breast changes, bilateral 03/05/2018  . Elevated liver enzymes 09/17/2017  . Other fatigue 02/20/2017  . Myalgia 02/20/2017  . Vitamin D insufficiency 11/03/2016  . Smoking greater than 30 pack years-  quit Dec 2015 09/12/2016  . Hypothyroidism 09/12/2016  . Hyperlipidemia 09/12/2016  . COPD (chronic obstructive pulmonary disease) ? 09/12/2016  . Degenerative disc disease, lumbar 09/12/2016  . GERD (gastroesophageal reflux disease)  09/12/2016  . Osteoporosis 09/12/2016  . Elevated alkaline phosphatase level 09/12/2016  . Rosacea 09/12/2016  . 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 Intermittent palpitations 07/20/2014  . Frequent unifocal PVCs 07/20/2014    Past Surgical History:  Procedure Laterality Date  . BREAST EXCISIONAL BIOPSY Right    benign  . BREAST LUMPECTOMY WITH RADIOACTIVE SEED LOCALIZATION Right 02/28/2018   Procedure: RIGHT BREAST LUMPECTOMY WITH RADIOACTIVE SEED LOCALIZATION ERAS PATHWAY;  Surgeon: Erroll Luna, MD;  Location: [place];  Service: General;  Laterality: Right;  . BREAST REDUCTION SURGERY Bilateral 02/28/2018   Procedure: BILATERA BREAST REDUCTION FOR SYMMERTY;  Surgeon: Wallace Going, DO;  Location: [place];  Service: Plastics;  Laterality: Bilateral;  . CHOLECYSTECTOMY  2011   lap choli  . COLONOSCOPY    . MICROLARYNGOSCOPY Left 01/01/2015   Procedure: MICROLARYNGOSCOPY WITH EXCISION OF LEFT VOCAL CORD NODULE ;  Surgeon: Jerrell Belfast, MD;  Location: [place];  Service: ENT;  Laterality: Left;  . REDUCTION MAMMAPLASTY Left     OB History   No obstetric history on file.      Home Medications    Prior to Admission medications   Medication Sig Start Date End Date Taking? Authorizing Provider  Ascorbic Acid (VITAMIN C) 500 MG CHEW  10/01/19   [provider]  Calcium Carbonate ([employer] CALCIUM) 1500 MG TABS Take by mouth.    [provider]  Cholecalciferol (VITAMIN D3) 1000 units CAPS Take 5 capsules (5,000 Units total) by mouth daily. 11/03/16  Opalski, Deborah, DO  CITRUS BERGAMOT PO Take 1 tablet by mouth 2 (two) times daily.    [provider]  ibandronate (BONIVA) 150 MG tablet Take 1 tablet (150 mg total) by mouth every 30 (thirty) days. Take in the morning with a full glass of water, on an empty stomach, and do not take anything else by mouth or lie down for the next 30 min.  03/16/20   Opalski, Neoma Laming, DO  levothyroxine (SYNTHROID) 100 MCG tablet TAKE 1 TABLET BY MOUTH DAILY. NEEDS OFFICE VISIT FOR REFILLS 02/02/20   Mellody Dance, DO    Family History Family History  Problem Relation Age of Onset  . Hyperlipidemia Mother   . Hypertension Mother   . CVA Mother   . Alcohol abuse Mother   . Dementia Mother   . Hypothyroidism Mother   . Cancer - Other Father        liver  . Alcohol abuse Brother   . ALS Brother   . Dementia Maternal Grandmother   . Stroke Maternal Grandfather   . Breast cancer Paternal Aunt        unsure of age  . Breast cancer Paternal Aunt        unsure of age    Social History Social History   Tobacco Use  . Smoking status: Former Smoker    Packs/day: 1.00    Years: 48.00    Pack years: 48.00    Types: Cigarettes    Quit date: 10/31/2014    Years since quitting: 5.4  . Smokeless tobacco: Never Used  . Tobacco comment: havent smoked in three weeks  Substance Use Topics  . Alcohol use: No  . Drug use: No     Allergies   Alendronate, Prozac [fluoxetine], and Zetia [ezetimibe]   Review of Systems As per HPI   Physical Exam Triage Vital Signs ED Triage Vitals  Enc Vitals Group     BP      Pulse      Resp      Temp      Temp src      SpO2      Weight      Height      Head Circumference      Peak Flow      Pain Score      Pain Loc      Pain Edu?      Excl. in [place]?    No data found.  Updated Vital Signs BP (!) 198/85 (BP Location: Left Arm)   Pulse 76   Temp 98.3 F (36.8 C) (Oral)   Resp 18   SpO2 97%   Visual Acuity Right Eye Distance:   Left Eye Distance:   Bilateral Distance:    Right Eye Near:   Left Eye Near:    Bilateral Near:     Physical Exam Constitutional:      General: She is not in acute distress. HENT:     Head: Normocephalic and atraumatic.  Eyes:     General: No scleral icterus.    Pupils: Pupils are equal, round, and reactive to light.  Cardiovascular:     Rate  and Rhythm: Normal rate.  Pulmonary:     Effort: Pulmonary effort is normal.  Skin:    Coloration: Skin is not jaundiced or pale.     Comments: Erythema migrans noted to left back.  Nontender and without warmth.  Neurological:     Mental Status: She is alert  and oriented to person, place, and time.      UC Treatments / Results  Labs (all labs ordered are listed, but only abnormal results are displayed) [place] MTN SPOTTED FVR ABS PNL(IGG+IGM)    EKG   Radiology No results found.  Procedures Procedures (including critical care time)  Medications Ordered in UC Medications  methylPREDNISolone sodium succinate (SOLU-MEDROL) 125 mg/2 mL injection 80 mg (has no administration in time range)  doxycycline (VIBRA-TABS) tablet 200 mg (has no administration in time range)    Initial Impression / Assessment and Plan / UC Course  I have reviewed the triage vital signs and the nursing notes.  Pertinent labs & imaging results that were available during my care of the patient were reviewed by me and considered in my medical decision making (see chart for details).     Patient febrile, nontoxic in office today.  No visible tics.  Given erythema migrans will treat prophylactically with doxycycline in office: tolerated this & IM solumedrol well.  Serology pending: Will forward to PCP for further management if needed.  Return precautions discussed, patient verbalized understanding and is agreeable to plan. Final Clinical Impressions(s) / UC Diagnoses   Final diagnoses:  Tick bite, initial encounter  Erythema migrans (Lyme disease)     Discharge Instructions     Blood work pending: Please check your MyChart for results. Today you were given steroid shot for itching. May take Benadryl additionally at bedtime. You were given prophylactic treatment for Lyme's disease in office (200 mg of doxycycline). Important to follow-up with your PCP for  repeat evaluation in 2-3 weeks.    ED Prescriptions    None     PDMP not reviewed this encounter.   Hall-Potvin, [place], [place] 04/20/20 1801

## 2020-04-23 LAB — ROCKY MTN SPOTTED FVR ABS PNL(IGG+IGM)
RMSF IgG: NEGATIVE
RMSF IgM: 0.46 index (ref 0.00–0.89)

## 2020-04-23 LAB — B. BURGDORFI ANTIBODIES: Lyme IgG/IgM Ab: <0.91 {ISR} (ref 0.00–0.90)

## 2020-05-06 ENCOUNTER — Telehealth: Payer: Self-pay | Admitting: Physician Assistant

## 2020-05-06 NOTE — Telephone Encounter (Signed)
Patient called wondered if she can get her 3 month labs for cholesterol & liver enzymes drawn @ this Appt for ( Tomorrow/ DOS 05/07/20)---- advise no future Lab Orders in chart would need to send inquiry to med asst for review & addt'n.   ---Forwarding request to med asst to review for necessary lab orders & add, so they can be drawn @ tomorrows appt.  --glh

## 2020-05-06 NOTE — Telephone Encounter (Signed)
No need to place future labs, patient has apt with Herb Grays and can discuss at the [place]. Left message to notify patient of this. AS, CMA

## 2020-05-07 ENCOUNTER — Other Ambulatory Visit: Payer: Self-pay

## 2020-05-07 ENCOUNTER — Encounter: Payer: Self-pay | Admitting: Physician Assistant

## 2020-05-07 ENCOUNTER — Ambulatory Visit: Payer: Medicare Other | Admitting: Physician Assistant

## 2020-05-07 VITALS — BP 131/73 | HR 65 | Temp 97.9°F | Ht 67.0 in | Wt 159.9 lb

## 2020-05-07 DIAGNOSIS — R748 Abnormal levels of other serum enzymes: Secondary | ICD-10-CM | POA: Diagnosis not present

## 2020-05-07 DIAGNOSIS — E785 Hyperlipidemia, unspecified: Secondary | ICD-10-CM

## 2020-05-07 DIAGNOSIS — A6920 Lyme disease, unspecified: Secondary | ICD-10-CM

## 2020-05-07 NOTE — Progress Notes (Signed)
Established Patient Office Visit  Subjective:  Patient ID: SKYYLAR KOPF, female    DOB: 04-04-1951  Age: 69 y.o. MRN: [id number]  CC:  Chief Complaint  Patient presents with  . Follow-up    tick bite    HPI Lori Montoya presents for follow-up on Lyme disease. Patient was seen at urgent care 04/20/20 for tick bites and a rash that was erythema migrans. She was started on doxycycline. Patient reports rash is much better and her tick bites are healing. Patient is also requesting to have labs drawn today to recheck lipid panel and hepatic function. She has been taking citrus bergamot, has made dietary changes and increased her physical activity to help improve her elevated cholesterol.   Past Medical History:  Diagnosis Date  . Elevated alkaline phosphatase level   . GERD (gastroesophageal reflux disease)   . Hyperlipidemia   . Osteoporosis   . Other and unspecified disc disorder of lumbar region   . Personal history of other disorders of nervous system and sense organs   . Seizures ([place])    last seizure in 1977  . Tobacco use disorder   . Unspecified hypothyroidism     Past Surgical History:  Procedure Laterality Date  . BREAST EXCISIONAL BIOPSY Right    benign  . BREAST LUMPECTOMY WITH RADIOACTIVE SEED LOCALIZATION Right 02/28/2018   Procedure: RIGHT BREAST LUMPECTOMY WITH RADIOACTIVE SEED LOCALIZATION ERAS PATHWAY;  Surgeon: Erroll Luna, MD;  Location: [place];  Service: General;  Laterality: Right;  . BREAST REDUCTION SURGERY Bilateral 02/28/2018   Procedure: BILATERA BREAST REDUCTION FOR SYMMERTY;  Surgeon: Wallace Going, DO;  Location: [place];  Service: Plastics;  Laterality: Bilateral;  . CHOLECYSTECTOMY  2011   lap choli  . COLONOSCOPY    . MICROLARYNGOSCOPY Left 01/01/2015   Procedure: MICROLARYNGOSCOPY WITH EXCISION OF LEFT VOCAL CORD NODULE ;  Surgeon: Jerrell Belfast, MD;  Location: [place];  Service:  ENT;  Laterality: Left;  . REDUCTION MAMMAPLASTY Left     Family History  Problem Relation Age of Onset  . Hyperlipidemia Mother   . Hypertension Mother   . CVA Mother   . Alcohol abuse Mother   . Dementia Mother   . Hypothyroidism Mother   . Cancer - Other Father        liver  . Alcohol abuse Brother   . ALS Brother   . Dementia Maternal Grandmother   . Stroke Maternal Grandfather   . Breast cancer Paternal Aunt        unsure of age  . Breast cancer Paternal Aunt        unsure of age    Social History   Socioeconomic History  . Marital status: Widowed    Spouse name: Not on file  . Number of children: Not on file  . Years of education: Not on file  . Highest education level: Not on file  Occupational History  . Not on file  Tobacco Use  . Smoking status: Former Smoker    Packs/day: 1.00    Years: 48.00    Pack years: 48.00    Types: Cigarettes    Quit date: 10/31/2014    Years since quitting: 5.5  . Smokeless tobacco: Never Used  . Tobacco comment: havent smoked in three weeks  Vaping Use  . Vaping Use: Never used  Substance and Sexual Activity  . Alcohol use: No  . Drug use: No  . Sexual  activity: Never    Comment: last cig 10/31/14  Other Topics Concern  . Not on file  Social History Narrative  . Not on file   Social Determinants of Health   Financial Resource Strain:   . Difficulty of Paying Living Expenses:   Food Insecurity:   . Worried About Charity fundraiser in the Last Year:   . Arboriculturist in the Last Year:   Transportation Needs:   . Film/video editor (Medical):   Marland Kitchen Lack of Transportation (Non-Medical):   Physical Activity:   . Days of Exercise per Week:   . Minutes of Exercise per Session:   Stress:   . Feeling of Stress :   Social Connections:   . Frequency of Communication with Friends and Family:   . Frequency of Social Gatherings with Friends and Family:   . Attends Religious Services:   . Active Member of Clubs or  Organizations:   . Attends Archivist Meetings:   Marland Kitchen Marital Status:   Intimate Partner Violence:   . Fear of Current or Ex-Partner:   . Emotionally Abused:   Marland Kitchen Physically Abused:   . Sexually Abused:     Outpatient Medications Prior to Visit  Medication Sig Dispense Refill  . Ascorbic Acid (VITAMIN C) 500 MG CHEW     . Calcium Carbonate ([employer]S CALCIUM) 1500 MG TABS Take by mouth.    . Cholecalciferol (VITAMIN D3) 1000 units CAPS Take 5 capsules (5,000 Units total) by mouth daily. 60 capsule   . CITRUS BERGAMOT PO Take 1 tablet by mouth 2 (two) times daily.    Marland Kitchen levothyroxine (SYNTHROID) 100 MCG tablet TAKE 1 TABLET BY MOUTH DAILY. NEEDS OFFICE VISIT FOR REFILLS 90 tablet 1  . ibandronate (BONIVA) 150 MG tablet Take 1 tablet (150 mg total) by mouth every 30 (thirty) days. Take in the morning with a full glass of water, on an empty stomach, and do not take anything else by mouth or lie down for the next 30 min. (Patient not taking: Reported on 05/07/2020) 6 tablet 0   No facility-administered medications prior to visit.    Allergies  Allergen Reactions  . Alendronate Other (See Comments)    Bone pain  . Prozac [Fluoxetine] Other (See Comments)    Headaches and dizziness  . Zetia [Ezetimibe] Other (See Comments)    Feet burning, bilat leg pains, Has, recurrent sinus problems    ROS Review of Systems  A fourteen system review of systems was performed and found to be positive as per HPI.  Objective:    Physical Exam General: Well nourished, in no apparent distress. Eyes: PERRLA, EOMs, conjunctiva clr Resp: Respiratory effort- normal, ECTA B/L w/o W/R/R  Cardio: RRR w/o MRGs. Abdomen: no gross distention. Lymphatics:  less 2 sec cap RF M-sk: Full ROM, 5/5 strength, normal gait.  Skin: Warm, dry. No signs of erythema migrans present. Lesion scaling.  Neuro: Alert, Oriented Psych: Normal affect, Insight and Judgment appropriate.   BP 131/73   Pulse 65   Temp 97.9  F (36.6 C) (Oral)   Ht 5\' 7"  (1.702 m)   Wt 159 lb 14.4 oz (72.5 kg)   SpO2 98% Comment: on RA  BMI 25.04 kg/m  Wt Readings from Last 3 Encounters:  05/07/20 159 lb 14.4 oz (72.5 kg)  03/22/20 155 lb 4.8 oz (70.4 kg)  10/20/19 158 lb 9.6 oz (71.9 kg)     Health Maintenance Due  Topic Date Due  .  COVID-19 Vaccine (1) Never done    There are no preventive care reminders to display for this patient.  Lab Results  Component Value Date   TSH 2.940 10/20/2019   Lab Results  Component Value Date   WBC 6.0 10/20/2019   HGB 13.9 10/20/2019   HCT 41.5 10/20/2019   MCV 90 10/20/2019   PLT 218 10/20/2019   Lab Results  Component Value Date   NA 141 10/20/2019   K 4.1 10/20/2019   CO2 24 10/20/2019   GLUCOSE 81 10/20/2019   BUN 14 10/20/2019   CREATININE 0.79 10/20/2019   BILITOT 0.5 05/07/2020   ALKPHOS 174 (H) 05/07/2020   AST 32 05/07/2020   ALT 25 05/07/2020   PROT 7.2 05/07/2020   ALBUMIN 4.3 05/07/2020   CALCIUM 9.6 10/20/2019   Lab Results  Component Value Date   CHOL 269 (H) 05/07/2020   Lab Results  Component Value Date   HDL 43 05/07/2020   Lab Results  Component Value Date   LDLCALC 199 (H) 05/07/2020   Lab Results  Component Value Date   TRIG 145 05/07/2020   Lab Results  Component Value Date   CHOLHDL 6.3 (H) 05/07/2020   Lab Results  Component Value Date   HGBA1C 5.2 10/20/2019      Assessment & Plan:   Problem List Items Addressed This Visit      Other   Hyperlipidemia (Chronic)   Relevant Orders   Lipid Profile (Completed)   Elevated liver enzymes   Relevant Orders   Hepatic function panel (Completed)    Other Visit Diagnoses    Lyme disease    -  Primary     Lyme diease: -Asymptomatic, rash has significantly improved, and pt completed prophylactic dose of doxycycline. -Serology tests done at urgent care were negative. Discussed with patient serologic testing after treatment is not necessary given she was treated early  in the course and may not develop an antibody response. Patient verbalized understanding and does not feel the need for retesting. -If a new rash or symptoms develop then RTC.  Hyperlipidemia: -Last lipid panel: total cholesterol and LDL elevated -Pt has been on statins in the past and experienced side effects of myalgias, therefore, she has been taking Citrus bergamot and made dietary and lifestyle changes. Pt verbalizes the importance of improving cholesterol to reduce cardiovascular events, especially with strong family history of heart disease, and if her lipid panel doesn't improve, then will consider starting statin therapy. Discussed with patient various management options for hyperlipidemia including referral to lipid clinic if unable to tolerate various statins.  -Continue heart healthy diet and moderate physical activity. -Placed lab order to recheck lipid panel today.  Elevated liver enzymes: -Placed lab order to recheck hepatic function. -Last ALT within normal limits at 25.   No orders of the defined types were placed in this encounter.   Follow-up: Return for  as scheduled.    Lorrene Reid, PA-C

## 2020-05-07 NOTE — Patient Instructions (Signed)
Lyme Disease Lyme disease is an infection that can affect many parts of the body, including the skin, joints, and nervous system. It is a bacterial infection that starts from the bite of an infected tick. Over time, the infection can worsen, and some of the symptoms are similar to the flu. If Lyme disease is not treated, it may cause joint pain, swelling, numbness, problems thinking, fatigue, muscle weakness, and other problems. What are the causes? This condition is caused by bacteria called Borrelia burgdorferi.  You can get Lyme disease by being bitten by an infected tick.  Only black-legged, or Ixodes, ticks that are infected with the bacteria can cause Lyme disease.  The tick must be attached to your skin for a certain period of time to pass along the infection. This is usually 36-48 hours.  Deer often carry infected ticks. What increases the risk? The following factors may make you more likely to develop this condition:  Living in or visiting these areas in the [place] ? [place]. ? The [place]. ? [place].  Spending time in wooded or grassy areas.  Being outdoors with exposed skin.  Camping, gardening, hiking, fishing, hunting, or working outdoors.  Failing to remove a tick from your skin. What are the signs or symptoms? Symptoms of this condition may include:  Chills and fever.  Headache.  Fatigue.  General achiness.  Muscle pain.  Joint pain, often in the knees.  A round, red rash that surrounds the center of the tick bite. The center of the rash may be blood colored or have tiny blisters.  Swollen lymph glands.  Stiff neck. How is this diagnosed? This condition is diagnosed based on:  Your symptoms and medical history.  A physical exam.  A blood test. How is this treated? The main treatment for this condition is antibiotic medicine, which is usually taken by mouth (orally).  The length of treatment depends on how soon after a  tick bite you begin taking the medicine. In some cases, treatment is necessary for several weeks.  If the infection is severe, antibiotics may need to be given through an IV that is inserted into one of your veins. Follow these instructions at home:  Take over-the-counter and prescription medicines only as told by your health care provider. Finish all antibiotic medicine, even when you start to feel better.  Ask your health care provider about taking a probiotic in between doses of your antibiotic to help avoid an upset stomach or diarrhea.  Check with your health care provider before supplementing your treatment. Many alternative therapies have not been proven and may be harmful to you.  Keep all follow-up visits as told by your health care provider. This is important. How is this prevented? You can become reinfected if you get another tick bite from an infected tick. Take these steps to help prevent an infection:  Cover your skin with light-colored clothing when you are outdoors in the spring and summer months.  Spray clothing and skin with bug spray. The spray should be 20-30% DEET. You can also treat clothing with permethrin, and let it dry before you wear it. Do not apply permethrin directly to your skin. Permethrin can also be used to treat camping gear and boots. Always read and follow the instructions that come with a bug spray or insecticide.  Avoid wooded, grassy, and shaded areas.  Remove yard litter, brush, trash, and plants that attract deer and rodents.  Check yourself for ticks when   you come indoors.  Wash clothing worn each day.  Shower after spending time outdoors.  Check your pets for ticks before they come inside.  If you find a tick attached to your skin: ? Remove it with tweezers. ? Clean your hands and the bite area with rubbing alcohol or soap and water. ? Dispose of the tick by putting it in rubbing alcohol, putting it in a sealed bag or container, or  flushing it down the toilet. ? You may choose to save the tick in a sealed container if you wish for it to be tested at a later time. Pregnant women should take special care to avoid tick bites because it is possible that the infection may be passed along to the fetus. Contact a health care provider if:  You have symptoms after treatment.  You have removed a tick and want to bring it to your health care provider for testing. Get help right away if:  You have an irregular heartbeat.  You have chest pain.  You have nerve pain.  Your face feels numb.  You develop the following: ? A stiff neck. ? A severe headache. ? Severe nausea and vomiting. ? Sensitivity to light. Summary  Lyme disease is an infection that can affect many parts of the body, including the skin, joints, and nervous system.  This condition is caused by bacteria called Borrelia burgdorferi.  You can get Lyme disease by being bitten by an infected tick.  The main treatment for this condition is antibiotic medicine. This information is not intended to replace advice given to you by your health care provider. Make sure you discuss any questions you have with your health care provider. Document Revised: 02/28/2019 Document Reviewed: 01/23/2019 Elsevier Patient Education  2020 Elsevier Inc.  

## 2020-05-08 LAB — LIPID PANEL
Chol/HDL Ratio: 6.3 ratio — ABNORMAL HIGH (ref 0.0–4.4)
Cholesterol, Total: 269 mg/dL — ABNORMAL HIGH (ref 100–199)
HDL: 43 mg/dL (ref >39)
LDL Chol Calc (NIH): 199 mg/dL — ABNORMAL HIGH (ref 0–99)
Triglycerides: 145 mg/dL (ref 0–149)
VLDL Cholesterol Cal: 27 mg/dL (ref 5–40)

## 2020-05-08 LAB — HEPATIC FUNCTION PANEL
ALT: 25 IU/L (ref 0–32)
AST: 32 IU/L (ref 0–40)
Albumin: 4.3 g/dL (ref 3.8–4.8)
Alkaline Phosphatase: 174 IU/L — ABNORMAL HIGH (ref 48–121)
Bilirubin Total: 0.5 mg/dL (ref 0.0–1.2)
Bilirubin, Direct: 0.11 mg/dL (ref 0.00–0.40)
Total Protein: 7.2 g/dL (ref 6.0–8.5)

## 2020-05-11 ENCOUNTER — Telehealth: Payer: Self-pay | Admitting: Physician Assistant

## 2020-05-11 ENCOUNTER — Encounter: Payer: Self-pay | Admitting: Physician Assistant

## 2020-05-11 NOTE — Telephone Encounter (Signed)
Patient advised labs have not been reviewed by provider at this time but we will contact her when labs reviewed. AS,CMA

## 2020-05-11 NOTE — Telephone Encounter (Signed)
Patient called and left VM asking to speak with clinic staff about getting back on a statin. She says her lipid panel came back abnormal and is wondering if Herb Grays wants her to start back on meds for this. Please advise.

## 2020-05-12 ENCOUNTER — Other Ambulatory Visit: Payer: Self-pay | Admitting: Physician Assistant

## 2020-05-12 MED ORDER — SIMVASTATIN 40 MG PO TABS
40.0000 mg | ORAL_TABLET | Freq: Every day | ORAL | 0 refills | Status: DC
Start: 2020-05-12 — End: 2020-09-28

## 2020-05-27 DIAGNOSIS — H524 Presbyopia: Secondary | ICD-10-CM | POA: Diagnosis not present

## 2020-06-29 ENCOUNTER — Encounter: Payer: Self-pay | Admitting: Physician Assistant

## 2020-06-29 ENCOUNTER — Other Ambulatory Visit: Payer: Self-pay

## 2020-06-29 ENCOUNTER — Ambulatory Visit (INDEPENDENT_AMBULATORY_CARE_PROVIDER_SITE_OTHER): Payer: Medicare Other | Admitting: Physician Assistant

## 2020-06-29 VITALS — BP 126/73 | HR 70 | Temp 98.0°F | Ht 67.0 in | Wt 156.6 lb

## 2020-06-29 DIAGNOSIS — R748 Abnormal levels of other serum enzymes: Secondary | ICD-10-CM | POA: Diagnosis not present

## 2020-06-29 DIAGNOSIS — E781 Pure hyperglyceridemia: Secondary | ICD-10-CM | POA: Diagnosis not present

## 2020-06-29 DIAGNOSIS — E039 Hypothyroidism, unspecified: Secondary | ICD-10-CM | POA: Diagnosis not present

## 2020-06-29 DIAGNOSIS — E785 Hyperlipidemia, unspecified: Secondary | ICD-10-CM

## 2020-06-29 NOTE — Assessment & Plan Note (Signed)
-  Stable -Checking hepatic function today. -Will continue to monitor.

## 2020-06-29 NOTE — Assessment & Plan Note (Addendum)
>>  ASSESSMENT AND PLAN FOR HYPERLIPIDEMIA WRITTEN ON 06/29/2020 10:29 AM BY Maliaka Brasington, PA-C  -Last lipid panel: total cholesterol 269, LDL 199 -Continue current medication regimen. -Continue heart healthy diet and exercise regimen. -Stay well hydrated. -Rechecking lipid panel and hepatic function.   >>ASSESSMENT AND PLAN FOR HYPERTRIGLYCERIDEMIA WRITTEN ON 06/29/2020 10:31 AM BY Darsha Zumstein, PA-C  -Last lipid panel: triglycerides 145 -Continue simvastatin. -Continue to monitor simple carbohydrates.

## 2020-06-29 NOTE — Assessment & Plan Note (Addendum)
-  Pt symptomatic with fatigue. -Checking TSH and free T4 today. -Pending lab results will make medication adjustments if indicated.

## 2020-06-29 NOTE — Assessment & Plan Note (Signed)
-  Last lipid panel: triglycerides 145 -Continue simvastatin. -Continue to monitor simple carbohydrates.

## 2020-06-29 NOTE — Progress Notes (Signed)
Established Patient Office Visit  Subjective:  Patient ID: Lori Montoya, female    DOB: 1951/02/04  Age: 69 y.o. MRN: [id number]  CC:  Chief Complaint  Patient presents with   Hyperlipidemia   Hypertension    HPI Lori Montoya presents for follow-up on hyperlipidemia and hypothyroid.  HLD: Pt taking medication as directed without major issues. Reports mild myalgias. Pt has chronic neck and shoulder pain but statin hasn't worsened symptoms. She continues with monitoring her diet and limiting saturated and trans fat. She continues with daily walks.   Hypothyroid: Reports medication compliance. Pt does report feeling more tired than usual. Denies weight changes or heat/cold intolerance.  Past Medical History:  Diagnosis Date   Elevated alkaline phosphatase level    GERD (gastroesophageal reflux disease)    Hyperlipidemia    Osteoporosis    Other and unspecified disc disorder of lumbar region    Personal history of other disorders of nervous system and sense organs    Seizures ([place])    last seizure in 1977   Tobacco use disorder    Unspecified hypothyroidism     Past Surgical History:  Procedure Laterality Date   BREAST EXCISIONAL BIOPSY Right    benign   BREAST LUMPECTOMY WITH RADIOACTIVE SEED LOCALIZATION Right 02/28/2018   Procedure: RIGHT BREAST LUMPECTOMY WITH RADIOACTIVE SEED LOCALIZATION ERAS PATHWAY;  Surgeon: Erroll Luna, MD;  Location: [facility];  Service: General;  Laterality: Right;   BREAST REDUCTION SURGERY Bilateral 02/28/2018   Procedure: [place];  Surgeon: Wallace Going, DO;  Location: [facility];  Service: Plastics;  Laterality: Bilateral;   CHOLECYSTECTOMY  2011   lap choli   COLONOSCOPY     MICROLARYNGOSCOPY Left 01/01/2015   Procedure: MICROLARYNGOSCOPY WITH EXCISION OF LEFT VOCAL CORD NODULE ;  Surgeon: Jerrell Belfast, MD;  Location: [facility];   Service: ENT;  Laterality: Left;   REDUCTION MAMMAPLASTY Left     Family History  Problem Relation Age of Onset   Hyperlipidemia Mother    Hypertension Mother    CVA Mother    Alcohol abuse Mother    Dementia Mother    Hypothyroidism Mother    Cancer - Other Father        liver   Alcohol abuse Brother    ALS Brother    Dementia Maternal Grandmother    Stroke Maternal Grandfather    Breast cancer Paternal Aunt        unsure of age   Breast cancer Paternal 84        unsure of age    Social History   Socioeconomic History   Marital status: Widowed    Spouse name: Not on file   Number of children: Not on file   Years of education: Not on file   Highest education level: Not on file  Occupational History   Not on file  Tobacco Use   Smoking status: Former Smoker    Packs/day: 1.00    Years: 48.00    Pack years: 48.00    Types: Cigarettes    Quit date: 10/31/2014    Years since quitting: 5.6   Smokeless tobacco: Never Used   Tobacco comment: havent smoked in three weeks  Vaping Use   Vaping Use: Never used  Substance and Sexual Activity   Alcohol use: No   Drug use: No   Sexual activity: Never    Comment: last cig 10/31/14  Other  Topics Concern   Not on file  Social History Narrative   Not on file   Social Determinants of Health   Financial Resource Strain:    Difficulty of Paying Living Expenses:   Food Insecurity:    Worried About Charity fundraiser in the Last Year:    Arboriculturist in the Last Year:   Transportation Needs:    Film/video editor (Medical):    Lack of Transportation (Non-Medical):   Physical Activity:    Days of Exercise per Week:    Minutes of Exercise per Session:   Stress:    Feeling of Stress :   Social Connections:    Frequency of Communication with Friends and Family:    Frequency of Social Gatherings with Friends and Family:    Attends Religious Services:    Active Member of  Clubs or Organizations:    Attends Music therapist:    Marital Status:   Intimate Partner Violence:    Fear of Current or Ex-Partner:    Emotionally Abused:    Physically Abused:    Sexually Abused:     Outpatient Medications Prior to Visit  Medication Sig Dispense Refill   Ascorbic Acid (VITAMIN C) 500 MG CHEW      Calcium Carbonate ([employer] CALCIUM) 1500 MG TABS Take by mouth.     Cholecalciferol (VITAMIN D3) 1000 units CAPS Take 5 capsules (5,000 Units total) by mouth daily. 60 capsule    levothyroxine (SYNTHROID) 100 MCG tablet TAKE 1 TABLET BY MOUTH DAILY. NEEDS OFFICE VISIT FOR REFILLS 90 tablet 1   simvastatin (ZOCOR) 40 MG tablet Take 1 tablet (40 mg total) by mouth at bedtime. 90 tablet 0   ibandronate (BONIVA) 150 MG tablet Take 1 tablet (150 mg total) by mouth every 30 (thirty) days. Take in the morning with a full glass of water, on an empty stomach, and do not take anything else by mouth or lie down for the next 30 min. (Patient not taking: Reported on 06/29/2020) 6 tablet 0   CITRUS BERGAMOT PO Take 1 tablet by mouth 2 (two) times daily.     No facility-administered medications prior to visit.    Allergies  Allergen Reactions   Alendronate Other (See Comments)    Bone pain   Prozac [Fluoxetine] Other (See Comments)    Headaches and dizziness   Zetia [Ezetimibe] Other (See Comments)    Feet burning, bilat leg pains, Has, recurrent sinus problems    ROS Review of Systems  A fourteen system review of systems was performed and found to be positive as per HPI.  Objective:    Physical Exam General:  Well Developed, well nourished, appropriate for stated age.  Neuro:  Alert and oriented,  extra-ocular muscles intact  HEENT:  Normocephalic, atraumatic, neck supple Skin:  no gross rash, warm, pink. Cardiac:  RRR, S1 S2 Respiratory:  ECTA B/L and A/P, Not using accessory muscles, speaking in full sentences- unlabored. Vascular:  Ext warm, no  cyanosis apprec.; cap RF less 2 sec. Psych:  No HI/SI, judgement and insight good, Euthymic mood. Full Affect.   BP 126/73    Pulse 70    Temp 98 F (36.7 C) (Oral)    Ht '5\' 7"'  (1.702 m)    Wt 156 lb 9.6 oz (71 kg)    SpO2 97% Comment: on RA   BMI 24.53 kg/m  Wt Readings from Last 3 Encounters:  06/29/20 156 lb 9.6 oz (71 kg)  05/07/20 159 lb 14.4 oz (72.5 kg)  03/22/20 155 lb 4.8 oz (70.4 kg)     Health Maintenance Due  Topic Date Due   INFLUENZA VACCINE  06/20/2020    There are no preventive care reminders to display for this patient.  Lab Results  Component Value Date   TSH 2.940 10/20/2019   Lab Results  Component Value Date   WBC 6.0 10/20/2019   HGB 13.9 10/20/2019   HCT 41.5 10/20/2019   MCV 90 10/20/2019   PLT 218 10/20/2019   Lab Results  Component Value Date   NA 141 10/20/2019   K 4.1 10/20/2019   CO2 24 10/20/2019   GLUCOSE 81 10/20/2019   BUN 14 10/20/2019   CREATININE 0.79 10/20/2019   BILITOT 0.5 05/07/2020   ALKPHOS 174 (H) 05/07/2020   AST 32 05/07/2020   ALT 25 05/07/2020   PROT 7.2 05/07/2020   ALBUMIN 4.3 05/07/2020   CALCIUM 9.6 10/20/2019   Lab Results  Component Value Date   CHOL 269 (H) 05/07/2020   Lab Results  Component Value Date   HDL 43 05/07/2020   Lab Results  Component Value Date   LDLCALC 199 (H) 05/07/2020   Lab Results  Component Value Date   TRIG 145 05/07/2020   Lab Results  Component Value Date   CHOLHDL 6.3 (H) 05/07/2020   Lab Results  Component Value Date   HGBA1C 5.2 10/20/2019      Assessment & Plan:   Problem List Items Addressed This Visit      Endocrine   Hypothyroidism (Chronic)    -Pt symptomatic with fatigue. -Checking TSH and free T4 today. -Pending lab results will make medication adjustments if indicated.      Relevant Orders   TSH   T4, free     Other   Hyperlipidemia - Primary (Chronic)    -Last lipid panel: total cholesterol 269, LDL 199 -Continue current medication  regimen. -Continue heart healthy diet and exercise regimen. -Stay well hydrated. -Rechecking lipid panel and hepatic function.       Relevant Orders   Lipid Profile   Comp Met (CMET)   Elevated liver enzymes    -Stable -Checking hepatic function today. -Will continue to monitor.      Relevant Orders   Comp Met (CMET)   Hypertriglyceridemia    -Last lipid panel: triglycerides 145 -Continue simvastatin. -Continue to monitor simple carbohydrates.      Relevant Orders   Lipid Profile   Comp Met (CMET)      No orders of the defined types were placed in this encounter.   Follow-up: Return in about 3 months (around 09/29/2020) for [place] [facility] [place].    Lorrene Reid, PA-C

## 2020-06-29 NOTE — Patient Instructions (Signed)
High Triglycerides Eating Plan Triglycerides are a type of fat in the blood. High levels of triglycerides can increase your risk of heart disease and stroke. If your triglyceride levels are high, choosing the right foods can help lower your triglycerides and keep your heart healthy. Work with your health care provider or a diet and nutrition specialist (dietitian) to develop an eating plan that is right for you. What are tips for following this plan? General guidelines   Lose weight, if you are overweight. For most people, losing 5-10 lbs (2-5 kg) helps lower triglyceride levels. A weight-loss plan may include. ? 30 minutes of exercise at least 5 days a week. ? Reducing the amount of calories, sugar, and fat you eat.  Eat a wide variety of fresh fruits, vegetables, and whole grains. These foods are high in fiber.  Eat foods that contain healthy fats, such as fatty fish, nuts, seeds, and olive oil.  Avoid foods that are high in added sugar, added salt (sodium), saturated fat, and trans fat.  Avoid low-fiber, refined carbohydrates such as white bread, crackers, noodles, and white rice.  Avoid foods with partially hydrogenated oils (trans fats), such as fried foods or stick margarine.  Limit alcohol intake to no more than 1 drink a day for nonpregnant women and 2 drinks a day for men. One drink equals 12 oz of beer, 5 oz of wine, or 1 oz of hard liquor. Your health care provider may recommend that you drink less depending on your overall health. Reading food labels  Check food labels for the amount of saturated fat. Choose foods with no or very little saturated fat.  Check food labels for the amount of trans fat. Choose foods with no trans fat.  Check food labels for the amount of cholesterol. Choose foods low in cholesterol. Ask your dietitian how much cholesterol you should have each day.  Check food labels for the amount of sodium. Choose foods with less than 140 milligrams (mg) per  serving. Shopping  Buy dairy products labeled as nonfat (skim) or low-fat (1%).  Avoid buying processed or prepackaged foods. These are often high in added sugar, sodium, and fat. Cooking  Choose healthy fats when cooking, such as olive oil or canola oil.  Cook foods using lower fat methods, such as baking, broiling, boiling, or grilling.  Make your own sauces, dressings, and marinades when possible, instead of buying them. Store-bought sauces, dressings, and marinades are often high in sodium and sugar. Meal planning  Eat more home-cooked food and less restaurant, buffet, and fast food.  Eat fatty fish at least 2 times each week. Examples of fatty fish include salmon, trout, mackerel, tuna, and herring.  If you eat whole eggs, do not eat more than 3 egg yolks per week. What foods are recommended? The items listed may not be a complete list. Talk with your dietitian about what dietary choices are best for you. Grains Whole wheat or whole grain breads, crackers, cereals, and pasta. Unsweetened oatmeal. Bulgur. Barley. Quinoa. Brown rice. Whole wheat flour tortillas. Vegetables Fresh or frozen vegetables. Low-sodium canned vegetables. Fruits All fresh, canned (in natural juice), or frozen fruits. Meats and other protein foods Skinless chicken or turkey. Ground chicken or turkey. Lean cuts of pork, trimmed of fat. Fish and seafood, especially salmon, trout, and herring. Egg whites. Dried beans, peas, or lentils. Unsalted nuts or seeds. Unsalted canned beans. Natural peanut or almond butter. Dairy Low-fat dairy products. Skim or low-fat (1%) milk. Reduced fat (  2%) and low-sodium cheese. Low-fat ricotta cheese. Low-fat cottage cheese. Plain, low-fat yogurt. Fats and oils Tub margarine without trans fats. Light or reduced-fat mayonnaise. Light or reduced-fat salad dressings. Avocado. Safflower, olive, sunflower, soybean, and canola oils. What foods are not recommended? The items listed  may not be a complete list. Talk with your dietitian about what dietary choices are best for you. Grains White bread. White (regular) pasta. White rice. Cornbread. Bagels. Pastries. Crackers that contain trans fat. Vegetables Creamed or fried vegetables. Vegetables in a cheese sauce. Fruits Sweetened dried fruit. Canned fruit in syrup. Fruit juice. Meats and other protein foods Fatty cuts of meat. Ribs. Chicken wings. Bacon. Sausage. Bologna. Salami. Chitterlings. Fatback. Hot dogs. Bratwurst. Packaged lunch meats. Dairy Whole or reduced-fat (2%) milk. Half-and-half. Cream cheese. Full-fat or sweetened yogurt. Full-fat cheese. Nondairy creamers. Whipped toppings. Processed cheese or cheese spreads. Cheese curds. Beverages Alcohol. Sweetened drinks, such as soda, lemonade, fruit drinks, or punches. Fats and oils Butter. Stick margarine. Lard. Shortening. Ghee. Bacon fat. Tropical oils, such as coconut, palm kernel, or palm oils. Sweets and desserts Corn syrup. Sugars. Honey. Molasses. Candy. Jam and jelly. Syrup. Sweetened cereals. Cookies. Pies. Cakes. Donuts. Muffins. Ice cream. Condiments Store-bought sauces, dressings, and marinades that are high in sugar, such as ketchup and barbecue sauce. Summary  High levels of triglycerides can increase the risk of heart disease and stroke. Choosing the right foods can help lower your triglycerides.  Eat plenty of fresh fruits, vegetables, and whole grains. Choose low-fat dairy and lean meats. Eat fatty fish at least twice a week.  Avoid processed and prepackaged foods with added sugar, sodium, saturated fat, and trans fat.  If you need suggestions or have questions about what types of food are good for you, talk with your health care provider or a dietitian. This information is not intended to replace advice given to you by your health care provider. Make sure you discuss any questions you have with your health care provider. Document Revised:  10/19/2017 Document Reviewed: 01/09/2017 Elsevier Patient Education  2020 Elsevier Inc[employer]  

## 2020-06-30 LAB — TSH: TSH: 1.09 u[IU]/mL (ref 0.450–4.500)

## 2020-06-30 LAB — COMPREHENSIVE METABOLIC PANEL
ALT: 25 IU/L (ref 0–32)
AST: 35 IU/L (ref 0–40)
Albumin/Globulin Ratio: 1.5 (ref 1.2–2.2)
Albumin: 4.2 g/dL (ref 3.8–4.8)
Alkaline Phosphatase: 165 IU/L — ABNORMAL HIGH (ref 48–121)
BUN/Creatinine Ratio: 16 (ref 12–28)
BUN: 12 mg/dL (ref 8–27)
Bilirubin Total: 0.4 mg/dL (ref 0.0–1.2)
CO2: 24 mmol/L (ref 20–29)
Calcium: 9.6 mg/dL (ref 8.7–10.3)
Chloride: 103 mmol/L (ref 96–106)
Creatinine, Ser: 0.75 mg/dL (ref 0.57–1.00)
GFR calc Af Amer: 94 mL/min/{1.73_m2} (ref >59)
GFR calc non Af Amer: 82 mL/min/{1.73_m2} (ref >59)
Globulin, Total: 2.8 g/dL (ref 1.5–4.5)
Glucose: 86 mg/dL (ref 65–99)
Potassium: 4.3 mmol/L (ref 3.5–5.2)
Sodium: 141 mmol/L (ref 134–144)
Total Protein: 7 g/dL (ref 6.0–8.5)

## 2020-06-30 LAB — LIPID PANEL
Chol/HDL Ratio: 3.4 ratio (ref 0.0–4.4)
Cholesterol, Total: 148 mg/dL (ref 100–199)
HDL: 43 mg/dL (ref >39)
LDL Chol Calc (NIH): 86 mg/dL (ref 0–99)
Triglycerides: 102 mg/dL (ref 0–149)
VLDL Cholesterol Cal: 19 mg/dL (ref 5–40)

## 2020-06-30 LAB — T4, FREE: Free T4: 1.52 ng/dL (ref 0.82–1.77)

## 2020-07-02 ENCOUNTER — Telehealth: Payer: Self-pay | Admitting: Physician Assistant

## 2020-07-07 ENCOUNTER — Other Ambulatory Visit: Payer: Medicare Other

## 2020-07-07 ENCOUNTER — Other Ambulatory Visit: Payer: Self-pay

## 2020-07-07 DIAGNOSIS — Z20822 Contact with and (suspected) exposure to covid-19: Secondary | ICD-10-CM | POA: Diagnosis not present

## 2020-07-09 LAB — SARS-COV-2, NAA 2 DAY TAT

## 2020-07-09 LAB — NOVEL CORONAVIRUS, NAA: SARS-CoV-2, NAA: NOT DETECTED

## 2020-07-27 ENCOUNTER — Other Ambulatory Visit: Payer: Self-pay | Admitting: Physician Assistant

## 2020-07-30 DIAGNOSIS — H25811 Combined forms of age-related cataract, right eye: Secondary | ICD-10-CM | POA: Diagnosis not present

## 2020-07-30 DIAGNOSIS — H25812 Combined forms of age-related cataract, left eye: Secondary | ICD-10-CM | POA: Diagnosis not present

## 2020-08-12 ENCOUNTER — Other Ambulatory Visit: Payer: Self-pay

## 2020-08-12 ENCOUNTER — Ambulatory Visit (INDEPENDENT_AMBULATORY_CARE_PROVIDER_SITE_OTHER): Payer: Medicare Other

## 2020-08-12 VITALS — BP 143/80 | HR 67 | Ht 67.0 in | Wt 157.0 lb

## 2020-08-12 DIAGNOSIS — Z23 Encounter for immunization: Secondary | ICD-10-CM | POA: Diagnosis not present

## 2020-08-12 NOTE — Progress Notes (Signed)
Patient came into office today for flu vaccine. Patient denies recent illness. Patient denies allergic reaction to eggs or previous flu vaccines. VIS given. Patient tolerated vaccine well. AS, CMA

## 2020-08-13 ENCOUNTER — Ambulatory Visit: Payer: Medicare Other

## 2020-08-13 DIAGNOSIS — H2511 Age-related nuclear cataract, right eye: Secondary | ICD-10-CM | POA: Diagnosis not present

## 2020-08-13 DIAGNOSIS — H25811 Combined forms of age-related cataract, right eye: Secondary | ICD-10-CM | POA: Diagnosis not present

## 2020-08-23 ENCOUNTER — Ambulatory Visit (INDEPENDENT_AMBULATORY_CARE_PROVIDER_SITE_OTHER): Payer: Medicare Other | Admitting: Physician Assistant

## 2020-08-23 ENCOUNTER — Encounter: Payer: Self-pay | Admitting: Physician Assistant

## 2020-08-23 ENCOUNTER — Other Ambulatory Visit: Payer: Self-pay

## 2020-08-23 VITALS — BP 147/77 | HR 69 | Resp 97 | Wt 156.0 lb

## 2020-08-23 DIAGNOSIS — S61214A Laceration without foreign body of right ring finger without damage to nail, initial encounter: Secondary | ICD-10-CM | POA: Diagnosis not present

## 2020-08-23 DIAGNOSIS — M79644 Pain in right finger(s): Secondary | ICD-10-CM | POA: Diagnosis not present

## 2020-08-23 MED ORDER — DOXYCYCLINE HYCLATE 100 MG PO TABS: 100.0000 mg | ORAL_TABLET | Freq: Two times a day (BID) | ORAL | 0 refills | Status: DC

## 2020-08-23 NOTE — Patient Instructions (Signed)
Sutured Wound Care Sutures are stitches that can be used to close wounds. Taking care of your wound properly can help to prevent pain and infection. It can also help your wound to heal more quickly. Follow instructions from your health care provider about how to care for your sutured wound. Supplies needed:  Soap and water.  A clean bandage (dressing), if needed.  Antibiotic ointment.  A clean towel. How to care for your sutured wound   Keep the wound completely dry for the first 24 hours, or for as long as directed by your health care provider. After 24-48 hours, you may shower or bathe as directed by your health care provider. Do not soak or submerge the wound in water until the sutures have been removed.  After the first 24 hours, clean the wound once a day, or as often as directed by your health care provider, using the following steps: ? Wash the wound with soap and water. ? Rinse the wound with water to remove all soap. ? Pat the wound dry with a clean towel. Do not rub the wound.  After cleaning the wound, apply a thin layer of antibiotic ointment as directed by your health care provider. This will prevent infection and keep the dressing from sticking to the wound.  Follow instructions from your health care provider about how to change your dressing: ? Wash your hands with soap and water. If soap and water are not available, use hand sanitizer. ? Change your dressing at least once a day, or as often as told by your health care provider. If your dressing gets wet or dirty, change it. ? Leave sutures and other skin closures, such as adhesive tape or skin glue, in place. These skin closures may need to stay in place for 2 weeks or longer. If adhesive strip edges start to loosen and curl up, you may trim the loose edges. Do not remove adhesive strips completely unless your health care provider tells you to do that.  Check your wound every day for signs of infection. Watch  for: ? Redness, swelling, or pain. ? Fluid or blood. ? Warmth. ? Pus or a bad smell.  Have the sutures removed as directed by your health care provider. Follow these instructions at home: Medicines  Take or apply over-the-counter and prescription medicines only as told by your health care provider.  If you were prescribed an antibiotic medicine or ointment, take or apply it as told by your health care provider. Do not stop using the antibiotic even if your condition improves. General instructions  To help reduce scarring after your wound heals, cover your wound with clothing or apply sunscreen of at least 30 SPF whenever you are outside.  Do not scratch or pick at your wound.  Avoid stretching your wound.  Raise (elevate) the injured area above the level of your heart while you are sitting or lying down, if possible.  Drink enough fluids to keep your urine clear or pale yellow.  Keep all follow-up visits as told by your health care provider. This is important. Contact a health care provider if:  You received a tetanus shot and you have swelling, severe pain, redness, or bleeding at the injection site.  Your wound breaks open.  You have redness, swelling, or pain around your wound.  You have fluid or blood coming from your wound.  Your wound feels warm to the touch.  You have a fever.  You notice something coming out of your  wound, such as wood or glass.  You have pain that does not get better with medicine.  The skin near your wound changes color.  You need to change your dressing very frequently due to a lot of fluid, blood, or pus draining from the wound.  You develop a new rash.  You develop numbness around the wound. Get help right away if:  You develop severe swelling around your wound.  You have pus or a bad smell coming from your wound.  Your pain suddenly gets worse and is severe.  You develop painful lumps near your wound or anywhere on your  body.  You have a red streak going away from your wound.  The wound is on your hand or foot and: ? You cannot properly move a finger or toe. ? Your fingers or toes look pale or bluish. ? You have numbness that is spreading down your hand, foot, fingers, or toes. Summary  Sutures are stitches that can be used to close wounds.  Taking care of your wound properly can help to prevent pain and infection.  Keep the wound completely dry for the first 24 hours, or for as long as directed by your health care provider. After 24-48 hours, you may shower or bathe as directed by your health care provider. This information is not intended to replace advice given to you by your health care provider. Make sure you discuss any questions you have with your health care provider. Document Revised: 10/19/2017 Document Reviewed: 12/12/2016 Elsevier Patient Education  [place], or Adhesive Wound Closure Doctors use stitches (sutures), staples, and glue (skin adhesives) to hold your skin together while it heals (wound closure). What your doctor uses depends on your wound.  In most cases, your wound will be closed right away (primary skin closure). Sometimes it may be closed later so that it can be cleaned and then heal naturally (delayed wound closure). What are the types of wound closure? Adhesive glue  To use adhesive glue, your doctor: ? Holds the edges of the wound together. ? Paints the glue onto your skin. You may need more than one layer. ? Covers your wound with a bandage (dressing) after the glue is dry.  Adhesive glue may be used for: ? Wounds that are not deep (superficial wounds). ? Wounds on the face. ? Children's wounds.  Adhesive glue is not used inside of wounds, or on wounds that are: ? Deep. ? Uneven. ? Bleeding.  Some benefits of adhesive glue are: ? It leaves nothing that needs to be removed. ? You do not need medicine to numb the area. ? You have  less pain than with other types of closure. Adhesive strips Adhesive strips are:  Made of paper that is sticky (adhesive) and has many small holes it in (porous).  Placed across your wound edges, like a normal bandage.  Used to close very shallow wounds.  Sometimes used with sutures to help improve closure. Sutures Sutures come in many different materials, strengths, and sizes. Some sutures break down as your wound heals (absorbable). Other sutures need to be removed (nonabsorbable). To use sutures, your doctor:  Sews your skin together with sutures and a needle.  May use one long (continuous) stitch or separate stitches.  Ties and cuts the sutures at the end. Sutures can be used for all types of wounds, including under the skin. They can cause a skin reaction that can lead to infection. [place] are often  used to close surgical cuts (incisions). To use staples, your doctor:  Holds the edges of your wound close together.  Places a staple across the wound.  Uses a tool to secure the staple to the skin.  Repeats this with as many staples as needed. Staples are faster to use than sutures, and they cause less reaction from your skin. Staples need to be removed using a tool that bends the staples away from your skin. Follow these instructions at home: Medicines  Take over-the-counter and prescription medicines only as told by your doctor.  If you were prescribed an antibiotic medicine, take it as told by your doctor. Do not stop taking the antibiotic even if you start to feel better. Wound care   Follow instructions from your doctor about how to take care of your wound and bandage.  Wash your hands with soap and water before and after touching your wound or bandage. If you cannot use soap and water, use hand sanitizer.  Do not try to remove your wound closures unless your doctor tells you to do that. You may need a follow-up visit for your doctor to remove your  closures. ? Closures may stay in place for 2 weeks or longer. ? Absorbable sutures may break down after a few days or weeks. ? If adhesive strip edges start to loosen and curl up, you may trim the loose edges.  Do not pick at your wound. Picking can cause an infection.  Apply ointments or creams only as told by your doctor.  Check your wound every day for signs of infection. Check for: ? Redness, swelling, or pain. ? Fluid or blood. ? Warmth. ? Pus or a bad smell. General instructions  Do not take baths, swim, or use a hot tub until your doctor approves. Ask your doctor if you may take showers. You may only be allowed to take sponge baths.  Do not soak your wound in water.  Keep all follow-up visits as told by your doctor. This is important. Contact a doctor if you:  Have any of the following: ? A fever. ? Chills. ? Redness, swelling, or pain around your wound. ? Fluid or blood coming from your wound. ? Pus or a bad smell coming from your wound.  Notice that your wound feels warm to the touch.  Notice that the edges of your wound start to separate after your sutures come out.  Notice that your wound becomes thick, raised, and darker in color after your sutures come out (scarring). Summary  What your doctor uses to hold your skin together while it heals (wound closure) depends on your wound.  Your doctor may use stitches (sutures), staples, and glue (skin adhesives).  Do not try to remove your wound closures unless your doctor tells you to do that.  Do not soak your wound in water. This information is not intended to replace advice given to you by your health care provider. Make sure you discuss any questions you have with your health care provider. Document Revised: 10/19/2017 Document Reviewed: 09/13/2017 Elsevier Patient Education  2020 [employer].

## 2020-08-23 NOTE — Progress Notes (Signed)
Acute Office Visit  Subjective:    Patient ID: Lori Montoya, female    DOB: Jun 05, 1951, 69 y.o.   MRN: [id number]  Chief Complaint  Patient presents with  . Laceration    HPI Patient is in today for right ring finger laceration after cutting herself with a piece of metal from a computer.  She is up-to-date on tetanus.  Past Medical History:  Diagnosis Date  . Elevated alkaline phosphatase level   . GERD (gastroesophageal reflux disease)   . Hyperlipidemia   . Osteoporosis   . Other and unspecified disc disorder of lumbar region   . Personal history of other disorders of nervous system and sense organs   . Seizures ([place])    last seizure in 1977  . Tobacco use disorder   . Unspecified hypothyroidism     Past Surgical History:  Procedure Laterality Date  . BREAST EXCISIONAL BIOPSY Right    benign  . BREAST LUMPECTOMY WITH RADIOACTIVE SEED LOCALIZATION Right 02/28/2018   Procedure: RIGHT BREAST LUMPECTOMY WITH RADIOACTIVE SEED LOCALIZATION ERAS PATHWAY;  Surgeon: Erroll Luna, MD;  Location: [place];  Service: General;  Laterality: Right;  . BREAST REDUCTION SURGERY Bilateral 02/28/2018   Procedure: BILATERA BREAST REDUCTION FOR SYMMERTY;  Surgeon: Wallace Going, DO;  Location: [place];  Service: Plastics;  Laterality: Bilateral;  . CHOLECYSTECTOMY  2011   lap choli  . COLONOSCOPY    . MICROLARYNGOSCOPY Left 01/01/2015   Procedure: MICROLARYNGOSCOPY WITH EXCISION OF LEFT VOCAL CORD NODULE ;  Surgeon: Jerrell Belfast, MD;  Location: [place];  Service: ENT;  Laterality: Left;  . REDUCTION MAMMAPLASTY Left     Family History  Problem Relation Age of Onset  . Hyperlipidemia Mother   . Hypertension Mother   . CVA Mother   . Alcohol abuse Mother   . Dementia Mother   . Hypothyroidism Mother   . Cancer - Other Father        liver  . Alcohol abuse Brother   . ALS Brother   . Dementia Maternal Grandmother   .  Stroke Maternal Grandfather   . Breast cancer Paternal Aunt        unsure of age  . Breast cancer Paternal Aunt        unsure of age    Social History   Socioeconomic History  . Marital status: Widowed    Spouse name: Not on file  . Number of children: Not on file  . Years of education: Not on file  . Highest education level: Not on file  Occupational History  . Not on file  Tobacco Use  . Smoking status: Former Smoker    Packs/day: 1.00    Years: 48.00    Pack years: 48.00    Types: Cigarettes    Quit date: 10/31/2014    Years since quitting: 5.8  . Smokeless tobacco: Never Used  . Tobacco comment: havent smoked in three weeks  Vaping Use  . Vaping Use: Never used  Substance and Sexual Activity  . Alcohol use: No  . Drug use: No  . Sexual activity: Never    Comment: last cig 10/31/14  Other Topics Concern  . Not on file  Social History Narrative  . Not on file   Social Determinants of Health   Financial Resource Strain:   . Difficulty of Paying Living Expenses: Not on file  Food Insecurity:   . Worried About Charity fundraiser  in the Last Year: Not on file  . Ran Out of Food in the Last Year: Not on file  Transportation Needs:   . Lack of Transportation (Medical): Not on file  . Lack of Transportation (Non-Medical): Not on file  Physical Activity:   . Days of Exercise per Week: Not on file  . Minutes of Exercise per Session: Not on file  Stress:   . Feeling of Stress : Not on file  Social Connections:   . Frequency of Communication with Friends and Family: Not on file  . Frequency of Social Gatherings with Friends and Family: Not on file  . Attends Religious Services: Not on file  . Active Member of Clubs or Organizations: Not on file  . Attends Archivist Meetings: Not on file  . Marital Status: Not on file  Intimate Partner Violence:   . Fear of Current or Ex-Partner: Not on file  . Emotionally Abused: Not on file  . Physically Abused: Not  on file  . Sexually Abused: Not on file    Outpatient Medications Prior to Visit  Medication Sig Dispense Refill  . Ascorbic Acid (VITAMIN C) 500 MG CHEW     . Calcium Carbonate ([employer] CALCIUM) 1500 MG TABS Take by mouth.    . Cholecalciferol (VITAMIN D3) 1000 units CAPS Take 5 capsules (5,000 Units total) by mouth daily. 60 capsule   . ibandronate (BONIVA) 150 MG tablet Take 1 tablet (150 mg total) by mouth every 30 (thirty) days. Take in the morning with a full glass of water, on an empty stomach, and do not take anything else by mouth or lie down for the next 30 min. (Patient not taking: Reported on 06/29/2020) 6 tablet 0  . levothyroxine (SYNTHROID) 100 MCG tablet Take 1 tablet (100 mcg total) by mouth daily before breakfast. 90 tablet 0  . simvastatin (ZOCOR) 40 MG tablet Take 1 tablet (40 mg total) by mouth at bedtime. 90 tablet 0   No facility-administered medications prior to visit.    Allergies  Allergen Reactions  . Alendronate Other (See Comments)    Bone pain  . Prozac [Fluoxetine] Other (See Comments)    Headaches and dizziness  . Zetia [Ezetimibe] Other (See Comments)    Feet burning, bilat leg pains, Has, recurrent sinus problems    Review of Systems A fourteen system review of systems was performed and found to be positive as per HPI.  Objective:    Physical Exam General: Well nourished, in no apparent distress. Eyes: PERRLA, EOMs, conjunctiva clr Resp: Respiratory effort Cardio: RRR w/o MRGs. Abdomen: no gross distention. M-sk: Good ROM of ring finger, good radial pulses, normal gait.  Skin: Warm, dry. Clean 2 cm laceration of right ring finger pad Neuro: Alert, Oriented, No sensory deficits of right hand noted Psych: Normal affect, Insight and Judgment appropriate.   BP (!) 147/77   Pulse 69   Resp (!) 97   Wt 156 lb (70.8 kg)   BMI 24.43 kg/m  Wt Readings from Last 3 Encounters:  08/23/20 156 lb (70.8 kg)  08/12/20 157 lb (71.2 kg)  06/29/20 156 lb  9.6 oz (71 kg)    There are no preventive care reminders to display for this patient.  There are no preventive care reminders to display for this patient.   Lab Results  Component Value Date   TSH 1.090 06/29/2020   Lab Results  Component Value Date   WBC 6.0 10/20/2019   HGB 13.9 10/20/2019  HCT 41.5 10/20/2019   MCV 90 10/20/2019   PLT 218 10/20/2019   Lab Results  Component Value Date   NA 141 06/29/2020   K 4.3 06/29/2020   CO2 24 06/29/2020   GLUCOSE 86 06/29/2020   BUN 12 06/29/2020   CREATININE 0.75 06/29/2020   BILITOT 0.4 06/29/2020   ALKPHOS 165 (H) 06/29/2020   AST 35 06/29/2020   ALT 25 06/29/2020   PROT 7.0 06/29/2020   ALBUMIN 4.2 06/29/2020   CALCIUM 9.6 06/29/2020   Lab Results  Component Value Date   CHOL 148 06/29/2020   Lab Results  Component Value Date   HDL 43 06/29/2020   Lab Results  Component Value Date   LDLCALC 86 06/29/2020   Lab Results  Component Value Date   TRIG 102 06/29/2020   Lab Results  Component Value Date   CHOLHDL 3.4 06/29/2020   Lab Results  Component Value Date   HGBA1C 5.2 10/20/2019       Assessment & Plan:   Problem List Items Addressed This Visit    None    Visit Diagnoses    Laceration of right ring finger without foreign body without damage to nail, initial encounter    -  Primary   Relevant Medications   doxycycline (VIBRA-TABS) 100 MG tablet     Laceration to right ring finger -Patient gave consent for and signed appropriate paperwork   LACERATION REPAIR PROCEDURE NOTE The patient's identification was confirmed and consent was obtained. This procedure was performed by Lorrene Reid 08/23/2020  Site:  Right ring finger pad Sterile procedures observed:  Yes Anesthetic used (type and amt): 1.5 cc 1 % lido w/o epi Suture type/size: 4-0 nylon Length: 2 cm # of Sutures: 5 Technique: Simple suture EBL:   <2cc Tetanus UTD or ordered:  yes - see immunization list Site anesthetized,  irrigated with NS solution, sterile field created.  Wound explored without evidence of foreign body.  Wound well approximated with sutures, site covered with dry, sterile dressing.   Patient tolerated procedure well without complications. Instructions for care discussed verbally and patient provided with additional written instructions for homecare and f/up care. Will send antibiotic.      Meds ordered this encounter  Medications  . doxycycline (VIBRA-TABS) 100 MG tablet    Sig: Take 1 tablet (100 mg total) by mouth 2 (two) times daily.    Dispense:  20 tablet    Refill:  0    Order Specific Question:   Supervising Provider    Answer:   Beatrice Lecher D [[id number]]     Lorrene Reid, PA-C

## 2020-09-01 ENCOUNTER — Other Ambulatory Visit: Payer: Self-pay

## 2020-09-01 ENCOUNTER — Ambulatory Visit (INDEPENDENT_AMBULATORY_CARE_PROVIDER_SITE_OTHER): Payer: Medicare Other | Admitting: Physician Assistant

## 2020-09-01 DIAGNOSIS — Z4802 Encounter for removal of sutures: Secondary | ICD-10-CM | POA: Diagnosis not present

## 2020-09-01 NOTE — Progress Notes (Signed)
Patient in office for suture removal. Wound is clean and dry. Suture were removed. AS, CMA

## 2020-09-10 DIAGNOSIS — H25812 Combined forms of age-related cataract, left eye: Secondary | ICD-10-CM | POA: Diagnosis not present

## 2020-09-10 DIAGNOSIS — H2512 Age-related nuclear cataract, left eye: Secondary | ICD-10-CM | POA: Diagnosis not present

## 2020-09-27 ENCOUNTER — Other Ambulatory Visit: Payer: Self-pay | Admitting: Physician Assistant

## 2020-09-27 DIAGNOSIS — E7800 Pure hypercholesterolemia, unspecified: Secondary | ICD-10-CM

## 2020-09-27 DIAGNOSIS — E785 Hyperlipidemia, unspecified: Secondary | ICD-10-CM

## 2020-09-27 DIAGNOSIS — Z0000 Encounter for general adult medical examination without abnormal findings: Secondary | ICD-10-CM

## 2020-09-27 DIAGNOSIS — E559 Vitamin D deficiency, unspecified: Secondary | ICD-10-CM

## 2020-09-27 DIAGNOSIS — E039 Hypothyroidism, unspecified: Secondary | ICD-10-CM

## 2020-09-28 ENCOUNTER — Encounter: Payer: Self-pay | Admitting: Physician Assistant

## 2020-09-28 ENCOUNTER — Other Ambulatory Visit: Payer: Self-pay

## 2020-09-28 ENCOUNTER — Ambulatory Visit (INDEPENDENT_AMBULATORY_CARE_PROVIDER_SITE_OTHER): Payer: Medicare Other | Admitting: Physician Assistant

## 2020-09-28 ENCOUNTER — Other Ambulatory Visit: Payer: Medicare Other

## 2020-09-28 VITALS — BP 118/69 | HR 86 | Temp 96.6°F | Ht 67.0 in | Wt 157.8 lb

## 2020-09-28 DIAGNOSIS — Z0000 Encounter for general adult medical examination without abnormal findings: Secondary | ICD-10-CM

## 2020-09-28 DIAGNOSIS — E785 Hyperlipidemia, unspecified: Secondary | ICD-10-CM

## 2020-09-28 DIAGNOSIS — E559 Vitamin D deficiency, unspecified: Secondary | ICD-10-CM | POA: Diagnosis not present

## 2020-09-28 DIAGNOSIS — Z1231 Encounter for screening mammogram for malignant neoplasm of breast: Secondary | ICD-10-CM

## 2020-09-28 DIAGNOSIS — E039 Hypothyroidism, unspecified: Secondary | ICD-10-CM

## 2020-09-28 DIAGNOSIS — E7800 Pure hypercholesterolemia, unspecified: Secondary | ICD-10-CM

## 2020-09-28 NOTE — Progress Notes (Signed)
Virtual Visit via Telephone Note:  I connected with Lori Montoya by telephone and verified that I am speaking with the correct person using two identifiers.    I discussed the limitations, risks, security and privacy concerns for performing an evaluation and management service by telephone and the availability of in person appointments. The staff discussed with patient that there may be a patient responsible charge related to this service. The patient expressed understanding and agreed to proceed.   Location of Patient- Home Location of Provider- Office    Subjective:   Lori Montoya is a 69 y.o. female who presents for Medicare Annual (Subsequent) preventive examination.  Review of Systems    General:   No F/C, wt loss Pulm:   No DIB, SOB, pleuritic chest pain Card:  No CP, palpitations Abd:  No n/v/d or pain Ext:  No inc edema from baseline   Objective:    Today's Vitals   09/28/20 0942  BP: 118/69  Pulse: 86  Temp: (!) 96.6 F (35.9 C)  TempSrc: Oral  SpO2: 98%  Weight: 157 lb 12.8 oz (71.6 kg)  Height: 5\' 7"  (1.702 m)   Body mass index is 24.71 kg/m.  Advanced Directives 02/28/2018 02/19/2018 05/16/2017 12/30/2014  Does Patient Have a Medical Advance Directive? No No No No  Would patient like information on creating a medical advance directive? No - Patient declined - No - Patient declined No - patient declined information    Current Medications (verified) Outpatient Encounter Medications as of 09/28/2020  Medication Sig  . Ascorbic Acid (VITAMIN C) 500 MG CHEW   . Calcium Carbonate ([employer] CALCIUM) 1500 MG TABS Take by mouth.  . Cholecalciferol (VITAMIN D3) 1000 units CAPS Take 5 capsules (5,000 Units total) by mouth daily.  Marland Kitchen levothyroxine (SYNTHROID) 100 MCG tablet Take 1 tablet (100 mcg total) by mouth daily before breakfast.  . simvastatin (ZOCOR) 10 MG tablet Take 10 mg by mouth daily.  Marland Kitchen ibandronate (BONIVA) 150 MG tablet Take 1 tablet (150 mg total) by mouth  every 30 (thirty) days. Take in the morning with a full glass of water, on an empty stomach, and do not take anything else by mouth or lie down for the next 30 min. (Patient not taking: Reported on 09/28/2020)  . [DISCONTINUED] doxycycline (VIBRA-TABS) 100 MG tablet Take 1 tablet (100 mg total) by mouth 2 (two) times daily.  . [DISCONTINUED] simvastatin (ZOCOR) 40 MG tablet Take 1 tablet (40 mg total) by mouth at bedtime.   No facility-administered encounter medications on file as of 09/28/2020.    Allergies (verified) Alendronate, Prozac [fluoxetine], and Zetia [ezetimibe]   History: Past Medical History:  Diagnosis Date  . Elevated alkaline phosphatase level   . GERD (gastroesophageal reflux disease)   . Hyperlipidemia   . Osteoporosis   . Other and unspecified disc disorder of lumbar region   . Personal history of other disorders of nervous system and sense organs   . Seizures ([place])    last seizure in 1977  . Tobacco use disorder   . Unspecified hypothyroidism    Past Surgical History:  Procedure Laterality Date  . BREAST EXCISIONAL BIOPSY Right    benign  . BREAST LUMPECTOMY WITH RADIOACTIVE SEED LOCALIZATION Right 02/28/2018   Procedure: RIGHT BREAST LUMPECTOMY WITH RADIOACTIVE SEED LOCALIZATION ERAS PATHWAY;  Surgeon: Erroll Luna, MD;  Location: [place];  Service: General;  Laterality: Right;  . BREAST REDUCTION SURGERY Bilateral 02/28/2018   Procedure: BILATERA BREAST  REDUCTION FOR SYMMERTY;  Surgeon: Wallace Going, DO;  Location: [place];  Service: Plastics;  Laterality: Bilateral;  . CHOLECYSTECTOMY  2011   lap choli  . COLONOSCOPY    . EYE SURGERY Bilateral    cataract extraction  . MICROLARYNGOSCOPY Left 01/01/2015   Procedure: MICROLARYNGOSCOPY WITH EXCISION OF LEFT VOCAL CORD NODULE ;  Surgeon: Jerrell Belfast, MD;  Location: [place];  Service: ENT;  Laterality: Left;  . REDUCTION MAMMAPLASTY Left     Family History  Problem Relation Age of Onset  . Hyperlipidemia Mother   . Hypertension Mother   . CVA Mother   . Alcohol abuse Mother   . Dementia Mother   . Hypothyroidism Mother   . Cancer - Other Father        liver  . Alcohol abuse Brother   . ALS Brother   . Dementia Maternal Grandmother   . Stroke Maternal Grandfather   . Breast cancer Paternal Aunt        unsure of age  . Breast cancer Paternal Aunt        unsure of age   Social History   Socioeconomic History  . Marital status: Widowed    Spouse name: Not on file  . Number of children: Not on file  . Years of education: Not on file  . Highest education level: Not on file  Occupational History  . Not on file  Tobacco Use  . Smoking status: Former Smoker    Packs/day: 1.00    Years: 48.00    Pack years: 48.00    Types: Cigarettes    Quit date: 10/31/2014    Years since quitting: 5.9  . Smokeless tobacco: Never Used  . Tobacco comment: havent smoked in three weeks  Vaping Use  . Vaping Use: Never used  Substance and Sexual Activity  . Alcohol use: No  . Drug use: No  . Sexual activity: Never    Comment: last cig 10/31/14  Other Topics Concern  . Not on file  Social History Narrative  . Not on file   Social Determinants of Health   Financial Resource Strain:   . Difficulty of Paying Living Expenses: Not on file  Food Insecurity:   . Worried About Charity fundraiser in the Last Year: Not on file  . Ran Out of Food in the Last Year: Not on file  Transportation Needs:   . Lack of Transportation (Medical): Not on file  . Lack of Transportation (Non-Medical): Not on file  Physical Activity:   . Days of Exercise per Week: Not on file  . Minutes of Exercise per Session: Not on file  Stress:   . Feeling of Stress : Not on file  Social Connections:   . Frequency of Communication with Friends and Family: Not on file  . Frequency of Social Gatherings with Friends and Family: Not on file  . Attends  Religious Services: Not on file  . Active Member of Clubs or Organizations: Not on file  . Attends Archivist Meetings: Not on file  . Marital Status: Not on file    Tobacco Counseling Counseling given: Not Answered Comment: havent smoked in three weeks    Diabetic? No         Activities of Daily Living In your present state of health, do you have any difficulty performing the following activities: 09/28/2020 08/23/2020  Hearing? N N  Vision? N N  Difficulty concentrating or  making decisions? N N  Walking or climbing stairs? N N  Dressing or bathing? N N  Doing errands, shopping? N N  Some recent data might be hidden    Patient Care Team: Lorrene Reid, PA-C as PCP - General Teena Irani, MD (Inactive) as Consulting Physician (Gastroenterology) Jerrell Belfast, MD as Consulting Physician (Otolaryngology) Darlin Coco, MD (Cardiology) Dillingham, Loel Lofty, DO as Attending Physician (Plastic Surgery) Mcarthur Rossetti, MD as Consulting Physician (Orthopedic Surgery)  Indicate any recent Medical Services you may have received from other than [facility] providers in the past year (date may be approximate).     Assessment:   This is a routine wellness examination for Alixandrea.  Hearing/Vision screen No exam data present  Dietary issues and exercise activities discussed: -Follow a heart healthy diet and continue to stay active.  Goals   None    Depression Screen PHQ 2/9 Scores 09/28/2020 08/23/2020 06/29/2020 05/07/2020 03/22/2020 10/20/2019 07/30/2019  PHQ - 2 Score 0 - 1 0 0 0 0  PHQ- 9 Score 3 - 7 0 0 1 0  Exception Documentation - Patient refusal - - - - -    Fall Risk Fall Risk  09/28/2020 08/23/2020 06/29/2020 10/20/2019 05/13/2018  Falls in the past year? 1 0 1 0 No  Number falls in past yr: 0 - - 0 -  Injury with Fall? 0 - - 0 -  Risk for fall due to : Other (Comment) - No Fall Risks - -  Risk for fall due to: Comment tripped over vaccuum cleaner -  - - -  Follow up Falls evaluation completed Falls evaluation completed Falls evaluation completed Falls evaluation completed -    Any stairs in or around the home? Yes  If so, are there any without handrails? No  Home free of loose throw rugs in walkways, pet beds, electrical cords, etc? Yes  Adequate lighting in your home to reduce risk of falls? Yes   ASSISTIVE DEVICES UTILIZED TO PREVENT FALLS:  Life alert? No  Use of a cane, walker or w/c? No  Grab bars in the bathroom? Yes  Shower chair or bench in shower? No  Elevated toilet seat or a handicapped toilet? Yes   TIMED UP AND GO:  Was the test performed? No .  Pt not in office for visit  Cognitive Function: wnl     6CIT Screen 09/28/2020 10/20/2019 05/16/2017  What Year? 0 points 0 points 0 points  What month? 0 points 0 points 0 points  What time? 0 points 0 points 0 points  Count back from 20 0 points 0 points 0 points  Months in reverse 0 points 0 points 0 points  Repeat phrase 0 points 0 points 0 points  Total Score 0 0 0    Immunizations Immunization History  Administered Date(s) Administered  . Fluad Quad(high Dose 65+) 07/30/2019  . Influenza, High Dose Seasonal PF 09/12/2016, 08/27/2017, 08/27/2018  . Influenza,inj,Quad PF,6+ Mos 08/12/2020  . PFIZER SARS-COV-2 Vaccination 12/15/2019, 01/05/2020  . Pneumococcal Conjugate-13 07/31/2016  . Pneumococcal Polysaccharide-23 08/14/2009, 09/17/2017  . Tdap 06/04/2013  . Zoster 12/07/2010  . Zoster Recombinat (Shingrix) 01/14/2018, 05/16/2018    TDAP status: Up to date Flu Vaccine status: Up to date Pneumococcal vaccine status: Up to date Covid-19 vaccine status: Completed vaccines  Qualifies for Shingles Vaccine? Yes   Zostavax completed Yes   Shingrix Completed?: Yes  Screening Tests Health Maintenance  Topic Date Due  . MAMMOGRAM  05/18/2021  . TETANUS/TDAP  06/05/2023  . COLONOSCOPY  06/01/2024  . INFLUENZA VACCINE  Completed  . DEXA SCAN   Completed  . COVID-19 Vaccine  Completed  . Hepatitis C Screening  Completed  . PNA vac Low Risk Adult  Completed    Health Maintenance  There are no preventive care reminders to display for this patient.  Colorectal cancer screening: Completed 06/01/2014. Repeat every 10 years Mammogram status: Ordered today. Pt provided with contact info and advised to call to schedule appt.  Bone Density status: Completed 03/15/2020. Results reflect: Bone density results: OSTEOPOROSIS. Repeat every 3 years.  Lung Cancer Screening: (Low Dose CT Chest recommended if Age 63-80 years, 30 pack-year currently smoking OR have quit w/in 15years.) does qualify.   Lung Cancer Screening Referral: Pt declined  Additional Screening:  Hepatitis C Screening: does qualify; Completed 08/01/2016  Vision Screening: Recommended annual ophthalmology exams for early detection of glaucoma and other disorders of the eye. Is the patient up to date with their annual eye exam?  Yes  Who is the provider or what is the name of the office in which the patient attends annual eye exams? [facility], [place]  Dental Screening: Recommended annual dental exams for proper oral hygiene  Community Resource Referral / Chronic Care Management: CRR required this visit?  No   CCM required this visit?  No      Plan:  -Patient decreased Simvastatin from 20 mg to 10 mg due to myalgias and arthralgias, and is tolerating much better. Pending fasting blood work will make medication adjustments if needed. -Follow up in 6 months for reg OV: thyroid, HLD  I have personally reviewed and noted the following in the patient's chart:   . Medical and social history . Use of alcohol, tobacco or illicit drugs  . Current medications and supplements . Functional ability and status . Nutritional status . Physical activity . Advanced directives . List of other physicians . Hospitalizations, surgeries, and ER visits in previous 12  months . Vitals . Screenings to include cognitive, depression, and falls . Referrals and appointments  In addition, I have reviewed and discussed with patient certain preventive protocols, quality metrics, and best practice recommendations. A written personalized care plan for preventive services as well as general preventive health recommendations were provided to patient.        09/28/2020

## 2020-09-29 LAB — COMPREHENSIVE METABOLIC PANEL
ALT: 28 IU/L (ref 0–32)
AST: 35 IU/L (ref 0–40)
Albumin/Globulin Ratio: 1.6 (ref 1.2–2.2)
Albumin: 4.4 g/dL (ref 3.8–4.8)
Alkaline Phosphatase: 164 IU/L — ABNORMAL HIGH (ref 44–121)
BUN/Creatinine Ratio: 12 (ref 12–28)
BUN: 10 mg/dL (ref 8–27)
Bilirubin Total: 0.4 mg/dL (ref 0.0–1.2)
CO2: 22 mmol/L (ref 20–29)
Calcium: 9.8 mg/dL (ref 8.7–10.3)
Chloride: 103 mmol/L (ref 96–106)
Creatinine, Ser: 0.82 mg/dL (ref 0.57–1.00)
GFR calc Af Amer: 84 mL/min/{1.73_m2} (ref >59)
GFR calc non Af Amer: 73 mL/min/{1.73_m2} (ref >59)
Globulin, Total: 2.8 g/dL (ref 1.5–4.5)
Glucose: 76 mg/dL (ref 65–99)
Potassium: 4.1 mmol/L (ref 3.5–5.2)
Sodium: 143 mmol/L (ref 134–144)
Total Protein: 7.2 g/dL (ref 6.0–8.5)

## 2020-09-29 LAB — CBC
Hematocrit: 42.7 % (ref 34.0–46.6)
Hemoglobin: 14 g/dL (ref 11.1–15.9)
MCH: 30.6 pg (ref 26.6–33.0)
MCHC: 32.8 g/dL (ref 31.5–35.7)
MCV: 93 fL (ref 79–97)
Platelets: 205 10*3/uL (ref 150–450)
RBC: 4.57 x10E6/uL (ref 3.77–5.28)
RDW: 13 % (ref 11.7–15.4)
WBC: 6.1 10*3/uL (ref 3.4–10.8)

## 2020-09-29 LAB — LIPID PANEL
Chol/HDL Ratio: 4 ratio (ref 0.0–4.4)
Cholesterol, Total: 197 mg/dL (ref 100–199)
HDL: 49 mg/dL (ref >39)
LDL Chol Calc (NIH): 126 mg/dL — ABNORMAL HIGH (ref 0–99)
Triglycerides: 123 mg/dL (ref 0–149)
VLDL Cholesterol Cal: 22 mg/dL (ref 5–40)

## 2020-09-29 LAB — VITAMIN D 25 HYDROXY (VIT D DEFICIENCY, FRACTURES): Vit D, 25-Hydroxy: 68.9 ng/mL (ref 30.0–100.0)

## 2020-09-29 LAB — HEMOGLOBIN A1C
Est. average glucose Bld gHb Est-mCnc: 100 mg/dL
Hgb A1c MFr Bld: 5.1 % (ref 4.8–5.6)

## 2020-09-29 LAB — TSH: TSH: 1.73 u[IU]/mL (ref 0.450–4.500)

## 2020-10-11 ENCOUNTER — Telehealth: Payer: Self-pay

## 2020-10-11 ENCOUNTER — Telehealth: Payer: Self-pay | Admitting: Physician Assistant

## 2020-10-11 MED ORDER — SIMVASTATIN 20 MG PO TABS: 20.0000 mg | ORAL_TABLET | Freq: Every day | ORAL | 0 refills | Status: DC

## 2020-10-11 MED ORDER — SIMVASTATIN 10 MG PO TABS: 10.0000 mg | ORAL_TABLET | Freq: Every day | ORAL | 0 refills | Status: DC

## 2020-10-11 NOTE — Telephone Encounter (Signed)
Patient needs simvastatin called in for 20mg  at [facility] drug, she was in office last week. Thanks

## 2020-10-11 NOTE — Telephone Encounter (Signed)
Pt called stating that RX for simvastatin was sent to pharmacy for 10mg  dose #90.  However, Lorrene Reid, PA-C instructed her at last OV to increase to 20mg .  Pt states that she has already picked up the 10mg  tablets.  Advised pt to take 2 tablets daily then pick up new RX for the 20mg  dosage, taking 1 daily.  RX sent to pharmacy.  Pt expressed understanding and is agreeable.  Charyl Bigger, CMA

## 2020-10-11 NOTE — Addendum Note (Signed)
Addended by: Mickel Crow on: 10/11/2020 11:43 AM   Modules accepted: Orders

## 2020-10-11 NOTE — Telephone Encounter (Signed)
Patient is taking Simvastatin 10mg . This med was sent to requested pharmacy. AS, CMA

## 2020-10-25 ENCOUNTER — Other Ambulatory Visit: Payer: Self-pay | Admitting: Physician Assistant

## 2020-11-18 ENCOUNTER — Other Ambulatory Visit: Payer: Self-pay

## 2020-11-18 ENCOUNTER — Ambulatory Visit
Admission: RE | Admit: 2020-11-18 | Discharge: 2020-11-18 | Disposition: A | Discharge status: Home / Self Care | Payer: Medicare Other | Source: Ambulatory Visit | Attending: Physician Assistant | Admitting: Physician Assistant

## 2020-11-18 DIAGNOSIS — Z1231 Encounter for screening mammogram for malignant neoplasm of breast: Secondary | ICD-10-CM | POA: Diagnosis not present

## 2020-12-07 ENCOUNTER — Telehealth: Payer: Self-pay | Admitting: Physician Assistant

## 2020-12-07 NOTE — Telephone Encounter (Signed)
Called pharmacy who ran the rx and it went through. She is getting pts meds together. Patient is aware. AS, CMA

## 2020-12-27 ENCOUNTER — Other Ambulatory Visit: Payer: Self-pay | Admitting: Physician Assistant

## 2020-12-27 DIAGNOSIS — E785 Hyperlipidemia, unspecified: Secondary | ICD-10-CM

## 2020-12-28 ENCOUNTER — Other Ambulatory Visit: Payer: Self-pay

## 2020-12-28 ENCOUNTER — Other Ambulatory Visit: Payer: Medicare Other

## 2020-12-28 DIAGNOSIS — E785 Hyperlipidemia, unspecified: Secondary | ICD-10-CM | POA: Diagnosis not present

## 2020-12-29 LAB — LIPID PANEL
Chol/HDL Ratio: 4.2 ratio (ref 0.0–4.4)
Cholesterol, Total: 175 mg/dL (ref 100–199)
HDL: 42 mg/dL (ref >39)
LDL Chol Calc (NIH): 107 mg/dL — ABNORMAL HIGH (ref 0–99)
Triglycerides: 148 mg/dL (ref 0–149)
VLDL Cholesterol Cal: 26 mg/dL (ref 5–40)

## 2021-01-24 ENCOUNTER — Other Ambulatory Visit: Payer: Self-pay | Admitting: Physician Assistant

## 2021-02-18 ENCOUNTER — Other Ambulatory Visit (INDEPENDENT_AMBULATORY_CARE_PROVIDER_SITE_OTHER): Payer: Medicare Other

## 2021-02-18 ENCOUNTER — Other Ambulatory Visit: Payer: Self-pay

## 2021-02-18 DIAGNOSIS — R300 Dysuria: Secondary | ICD-10-CM | POA: Diagnosis not present

## 2021-02-18 LAB — POCT URINALYSIS DIPSTICK
Bilirubin, UA: NEGATIVE
Glucose, UA: NEGATIVE
Ketones, UA: NEGATIVE
Leukocytes, UA: NEGATIVE
Nitrite, UA: NEGATIVE
Protein, UA: NEGATIVE
Spec Grav, UA: 1.025 (ref 1.010–1.025)
Urobilinogen, UA: 0.2 E.U./dL
pH, UA: 6 (ref 5.0–8.0)

## 2021-02-18 NOTE — Addendum Note (Signed)
Addended by: Aron Baba on: 02/18/2021 10:50 AM   Modules accepted: Orders

## 2021-03-28 ENCOUNTER — Ambulatory Visit (INDEPENDENT_AMBULATORY_CARE_PROVIDER_SITE_OTHER): Payer: Medicare Other | Admitting: Physician Assistant

## 2021-03-28 ENCOUNTER — Other Ambulatory Visit: Payer: Medicare Other

## 2021-03-28 ENCOUNTER — Encounter: Payer: Self-pay | Admitting: Physician Assistant

## 2021-03-28 ENCOUNTER — Telehealth: Payer: Self-pay | Admitting: Physician Assistant

## 2021-03-28 ENCOUNTER — Other Ambulatory Visit: Payer: Self-pay

## 2021-03-28 VITALS — BP 134/78 | HR 83 | Temp 98.4°F | Ht 67.0 in | Wt 163.3 lb

## 2021-03-28 DIAGNOSIS — M810 Age-related osteoporosis without current pathological fracture: Secondary | ICD-10-CM

## 2021-03-28 DIAGNOSIS — R748 Abnormal levels of other serum enzymes: Secondary | ICD-10-CM

## 2021-03-28 DIAGNOSIS — E785 Hyperlipidemia, unspecified: Secondary | ICD-10-CM

## 2021-03-28 DIAGNOSIS — Z0000 Encounter for general adult medical examination without abnormal findings: Secondary | ICD-10-CM

## 2021-03-28 DIAGNOSIS — E781 Pure hyperglyceridemia: Secondary | ICD-10-CM | POA: Diagnosis not present

## 2021-03-28 DIAGNOSIS — E039 Hypothyroidism, unspecified: Secondary | ICD-10-CM

## 2021-03-28 NOTE — Assessment & Plan Note (Signed)
-  Last triglycerides 148, wnl's. -Recommend to monitor simple carbohydrates, continue walking regimen and statin therapy. -Will continue to monitor.

## 2021-03-28 NOTE — Assessment & Plan Note (Addendum)
>>  ASSESSMENT AND PLAN FOR HYPERLIPIDEMIA WRITTEN ON 03/28/2021  1:55 PM BY Emilygrace Grothe, PA-C  -Last lipid panel: total cholesterol 175, triglycerides 148, HDL 42, LDL 107 -Continue current medication regimen. Patient has hx of statin intolerance and is tolerating alternating Simvastatin 20 mg/10 mg without significant myopathy.  -Lipid panel and hepatic function were collected this morning. -Will continue to monitor.   >>ASSESSMENT AND PLAN FOR HYPERTRIGLYCERIDEMIA WRITTEN ON 03/28/2021  1:56 PM BY Courvoisier Hamblen, PA-C  -Last triglycerides 148, wnl's. -Recommend to monitor simple carbohydrates, continue walking regimen and statin therapy. -Will continue to monitor.

## 2021-03-28 NOTE — Patient Instructions (Signed)
Osteoporosis  Osteoporosis is when the bones get thin and weak. This can cause your bones to break (fracture) more easily. What are the causes? The exact cause of this condition is not known. What increases the risk?  Having family members with this condition.  Not eating enough healthy foods.  Taking certain medicines.  Being female.  Being age 70 or older.  Smoking or using other products that contain nicotine or tobacco, such as e-cigarettes or chewing tobacco.  Not exercising.  Being of European or Asian ancestry.  Having a small body frame. What are the signs or symptoms? A broken bone might be the first sign, especially if the break results from a fall or injury that usually would not cause a bone to break. Other signs and symptoms include:  Pain in the neck or low back.  Being hunched over (stooped posture).  Getting shorter. How is this treated?  Eating more foods with more calcium and vitamin D in them.  Doing exercises.  Stopping tobacco use.  Limiting how much alcohol you drink.  Taking medicines to slow bone loss or help make the bones stronger.  Taking supplements of calcium and vitamin D every day.  Taking medicines to replace chemicals in the body (hormone replacement medicines).  Monitoring your levels of calcium and vitamin D. The goal of treatment is to strengthen your bones and lower your risk for a bone break. Follow these instructions at home: Eating and drinking Eat plenty of calcium and vitamin D. These nutrients are good for your bones. Good sources of calcium and vitamin D include:  Some fish, such as salmon and tuna.  Foods that have calcium and vitamin D added to them (fortified foods), such as some breakfast cereals.  Egg yolks.  Cheese.  Liver.   Activity Do exercises as told by your doctor. Ask your doctor what exercises are safe for you. You should do:  Exercises that make your muscles work to hold your body weight up  (weight-bearing exercises). These include tai chi, yoga, and walking.  Exercises to make your muscles stronger. One example is lifting weights. Lifestyle  Do not drink alcohol if: ? Your doctor tells you not to drink. ? You are pregnant, may be pregnant, or are planning to become pregnant.  If you drink alcohol: ? Limit how much you use to:  0-1 drink a day for women.  0-2 drinks a day for men.  Know how much alcohol is in your drink. In the [place] one drink equals one 12 oz bottle of beer (355 mL), one 5 oz glass of wine (148 mL), or one 1 oz glass of hard liquor (44 mL).  Do not smoke or use any products that contain nicotine or tobacco. If you need help quitting, ask your doctor. Preventing falls  Use tools to help you move around (mobility aids) as needed. These include canes, walkers, scooters, and crutches.  Keep rooms well-lit.  Put away things on the floor that could make you trip. These include cords and rugs.  Install safety rails on stairs. Install grab bars in bathrooms.  Use rubber mats in slippery areas, like bathrooms.  Wear shoes that: ? Fit you well. ? Support your feet. ? Have closed toes. ? Have rubber soles or low heels.  Tell your doctor about all of the medicines you are taking. Some medicines can make you more likely to fall. General instructions  Take over-the-counter and prescription medicines only as told by your doctor.    Keep all follow-up visits. Contact a doctor if:  You have not been tested (screened) for osteoporosis and you are: ? A woman who is age 65 or older. ? A man who is age 70 or older. Get help right away if:  You fall.  You get hurt. Summary  Osteoporosis happens when your bones get thin and weak.  Weak bones can break (fracture) more easily.  Eat plenty of calcium and vitamin D. These are good for your bones.  Tell your doctor about all of the medicines that you take. This information is not intended to replace  advice given to you by your health care provider. Make sure you discuss any questions you have with your health care provider. Document Revised: 04/22/2020 Document Reviewed: 04/22/2020 Elsevier Patient Education  2021 Elsevier Inc.  

## 2021-03-28 NOTE — Progress Notes (Signed)
Established Patient Office Visit  Subjective:  Patient ID: Lori Montoya, female    DOB: [birth year]  Age: 70 y.o. MRN: [id number]  CC:  Chief Complaint  Patient presents with  . Thyroid Problem  . Hyperlipidemia    HPI Lori Montoya presents for follow up hyperlipidemia and hypothyroidism.  HLD: Pt is alternating simvastatin 20 mg with 10 mg and tolerating without significant side effects. Reports has not been as active during the winter but has started walking regimen again. Continues to eat lean meats, salads and vegetables. Reports does not like fruits much. Reports an incident where she got severely sick from watermelon.  Hypothyroidism: Taking medication as directed. Denies fatigue, hair changes, palpitations or heat/cold intolerance.  Past Medical History:  Diagnosis Date  . Elevated alkaline phosphatase level   . GERD (gastroesophageal reflux disease)   . Hyperlipidemia   . Osteoporosis   . Other and unspecified disc disorder of lumbar region   . Personal history of other disorders of nervous system and sense organs   . Seizures ([place])    last seizure in 1977  . Tobacco use disorder   . Unspecified hypothyroidism     Past Surgical History:  Procedure Laterality Date  . BREAST EXCISIONAL BIOPSY Right    benign  . BREAST LUMPECTOMY WITH RADIOACTIVE SEED LOCALIZATION Right 02/28/2018   Procedure: RIGHT BREAST LUMPECTOMY WITH RADIOACTIVE SEED LOCALIZATION ERAS PATHWAY;  Surgeon: Erroll Luna, MD;  Location: [place];  Service: General;  Laterality: Right;  . BREAST REDUCTION SURGERY Bilateral 02/28/2018   Procedure: BILATERA BREAST REDUCTION FOR SYMMERTY;  Surgeon: Wallace Going, DO;  Location: [place];  Service: Plastics;  Laterality: Bilateral;  . CHOLECYSTECTOMY  2011   lap choli  . COLONOSCOPY    . EYE SURGERY Bilateral    cataract extraction  . MICROLARYNGOSCOPY Left 01/01/2015   Procedure: MICROLARYNGOSCOPY WITH  EXCISION OF LEFT VOCAL CORD NODULE ;  Surgeon: Jerrell Belfast, MD;  Location: [place];  Service: ENT;  Laterality: Left;  . REDUCTION MAMMAPLASTY Left     Family History  Problem Relation Age of Onset  . Hyperlipidemia Mother   . Hypertension Mother   . CVA Mother   . Alcohol abuse Mother   . Dementia Mother   . Hypothyroidism Mother   . Cancer - Other Father        liver  . Alcohol abuse Brother   . ALS Brother   . Dementia Maternal Grandmother   . Stroke Maternal Grandfather   . Breast cancer Paternal Aunt        unsure of age  . Breast cancer Paternal Aunt        unsure of age    Social History   Socioeconomic History  . Marital status: Widowed    Spouse name: Not on file  . Number of children: Not on file  . Years of education: Not on file  . Highest education level: Not on file  Occupational History  . Not on file  Tobacco Use  . Smoking status: Former Smoker    Packs/day: 1.00    Years: 48.00    Pack years: 48.00    Types: Cigarettes    Quit date: 10/31/2014    Years since quitting: 6.4  . Smokeless tobacco: Never Used  . Tobacco comment: havent smoked in three weeks  Vaping Use  . Vaping Use: Never used  Substance and Sexual Activity  . Alcohol use: No  .  Drug use: No  . Sexual activity: Never    Comment: last cig 10/31/14  Other Topics Concern  . Not on file  Social History Narrative  . Not on file   Social Determinants of Health   Financial Resource Strain: Not on file  Food Insecurity: Not on file  Transportation Needs: Not on file  Physical Activity: Not on file  Stress: Not on file  Social Connections: Not on file  Intimate Partner Violence: Not on file    Outpatient Medications Prior to Visit  Medication Sig Dispense Refill  . Ascorbic Acid (VITAMIN C) 500 MG CHEW     . calcium carbonate (OSCAL) 1500 (600 Ca) MG TABS tablet Take by mouth.    . Cholecalciferol (VITAMIN D3) 1000 units CAPS Take 5 capsules (5,000  Units total) by mouth daily. 60 capsule   . levothyroxine (SYNTHROID) 100 MCG tablet TAKE 1 TABLET (100 MCG TOTAL) BY MOUTH DAILY BEFORE BREAKFAST. 90 tablet 0  . simvastatin (ZOCOR) 20 MG tablet Take 1 tablet (20 mg total) by mouth at bedtime. 90 tablet 0  . ibandronate (BONIVA) 150 MG tablet Take 1 tablet (150 mg total) by mouth every 30 (thirty) days. Take in the morning with a full glass of water, on an empty stomach, and do not take anything else by mouth or lie down for the next 30 min. (Patient not taking: No sig reported) 6 tablet 0   No facility-administered medications prior to visit.    Allergies  Allergen Reactions  . Alendronate Other (See Comments)    Bone pain  . Prozac [Fluoxetine] Other (See Comments)    Headaches and dizziness  . Zetia [Ezetimibe] Other (See Comments)    Feet burning, bilat leg pains, Has, recurrent sinus problems    ROS Review of Systems  A fourteen system review of systems was performed and found to be positive as per HPI.   Objective:    Physical Exam General:  Well Developed, well nourished, appropriate for stated age, in no acute distress  Neuro:  Alert and oriented,  extra-ocular muscles intact  HEENT:  Normocephalic, atraumatic, neck supple  Skin:  no gross rash, warm, pink. Cardiac:  RRR, S1 S2 wnl's  Respiratory:  ECTA B/L w/o wheezing, crackles or rales, Not using accessory muscles, speaking in full sentences- unlabored. Vascular:  Ext warm, no cyanosis apprec.; cap RF less 2 sec. Psych:  No HI/SI, judgement and insight good, Euthymic mood. Full Affect.   BP 134/78   Pulse 83   Temp 98.4 F (36.9 C)   Ht 5\' 7"  (1.702 m)   Wt 163 lb 4.8 oz (74.1 kg)   SpO2 100%   BMI 25.58 kg/m  Wt Readings from Last 3 Encounters:  03/28/21 163 lb 4.8 oz (74.1 kg)  09/28/20 157 lb 12.8 oz (71.6 kg)  08/23/20 156 lb (70.8 kg)     There are no preventive care reminders to display for this patient.  There are no preventive care reminders to  display for this patient.  Lab Results  Component Value Date   TSH 1.730 09/28/2020   Lab Results  Component Value Date   WBC 6.1 09/28/2020   HGB 14.0 09/28/2020   HCT 42.7 09/28/2020   MCV 93 09/28/2020   PLT 205 09/28/2020   Lab Results  Component Value Date   NA 143 09/28/2020   K 4.1 09/28/2020   CO2 22 09/28/2020   GLUCOSE 76 09/28/2020   BUN 10 09/28/2020   CREATININE  0.82 09/28/2020   BILITOT 0.4 09/28/2020   ALKPHOS 164 (H) 09/28/2020   AST 35 09/28/2020   ALT 28 09/28/2020   PROT 7.2 09/28/2020   ALBUMIN 4.4 09/28/2020   CALCIUM 9.8 09/28/2020   Lab Results  Component Value Date   CHOL 175 12/28/2020   Lab Results  Component Value Date   HDL 42 12/28/2020   Lab Results  Component Value Date   LDLCALC 107 (H) 12/28/2020   Lab Results  Component Value Date   TRIG 148 12/28/2020   Lab Results  Component Value Date   CHOLHDL 4.2 12/28/2020   Lab Results  Component Value Date   HGBA1C 5.1 09/28/2020      Assessment & Plan:   Problem List Items Addressed This Visit      Endocrine   Hypothyroidism (Chronic)    -Last TSH wnl -Continue current medication regimen. -Will repeat TSH with annual [facility].         Musculoskeletal and Integument   Osteoporosis (Chronic)     Other   Hyperlipidemia - Primary (Chronic)    -Last lipid panel: total cholesterol 175, triglycerides 148, HDL 42, LDL 107 -Continue current medication regimen. Patient has hx of statin intolerance and is tolerating alternating Simvastatin 20 mg/10 mg without significant myopathy.  -Lipid panel and hepatic function were collected this morning. -Will continue to monitor.       Hypertriglyceridemia    -Last triglycerides 148, wnl's. -Recommend to monitor simple carbohydrates, continue walking regimen and statin therapy. -Will continue to monitor.        Osteoporosis: -Patient is hesitant to start Boniva due to potential side effects, especially jaw necrosis. Encourage  to reconsider. -Recommend to continue Vit D and calcium supplements.  No orders of the defined types were placed in this encounter.   Follow-up: Return in about 6 months (around 09/28/2021) for [facility] and [facility] few days prior .   Note:  This note was prepared with assistance of Dragon voice recognition software. Occasional wrong-word or sound-a-like substitutions may have occurred due to the inherent limitations of voice recognition software.   Lorrene Reid, PA-C

## 2021-03-28 NOTE — Telephone Encounter (Signed)
Patient coming in this morning for labs. AS, CMA

## 2021-03-28 NOTE — Telephone Encounter (Signed)
Patient would like to know if she needs to fast for her appt today at 1:15, please advise. Thanks

## 2021-03-28 NOTE — Assessment & Plan Note (Signed)
-  Last TSH wnl -Continue current medication regimen. -Will repeat TSH with annual [facility].

## 2021-03-29 LAB — HEPATIC FUNCTION PANEL
ALT: 27 IU/L (ref 0–32)
AST: 31 IU/L (ref 0–40)
Albumin: 4.5 g/dL (ref 3.8–4.8)
Alkaline Phosphatase: 160 IU/L — ABNORMAL HIGH (ref 44–121)
Bilirubin Total: 0.4 mg/dL (ref 0.0–1.2)
Bilirubin, Direct: 0.13 mg/dL (ref 0.00–0.40)
Total Protein: 7.2 g/dL (ref 6.0–8.5)

## 2021-03-29 LAB — LIPID PANEL
Chol/HDL Ratio: 4.3 ratio (ref 0.0–4.4)
Cholesterol, Total: 193 mg/dL (ref 100–199)
HDL: 45 mg/dL (ref >39)
LDL Chol Calc (NIH): 122 mg/dL — ABNORMAL HIGH (ref 0–99)
Triglycerides: 143 mg/dL (ref 0–149)
VLDL Cholesterol Cal: 26 mg/dL (ref 5–40)

## 2021-04-11 ENCOUNTER — Other Ambulatory Visit: Payer: Self-pay | Admitting: Physician Assistant

## 2021-04-25 ENCOUNTER — Other Ambulatory Visit: Payer: Self-pay | Admitting: Physician Assistant

## 2021-05-08 ENCOUNTER — Other Ambulatory Visit: Payer: Self-pay

## 2021-05-08 ENCOUNTER — Ambulatory Visit
Admission: EM | Admit: 2021-05-08 | Discharge: 2021-05-08 | Disposition: A | Discharge status: Home / Self Care | Payer: Medicare Other | Attending: Physician Assistant | Admitting: Physician Assistant

## 2021-05-08 DIAGNOSIS — N3000 Acute cystitis without hematuria: Secondary | ICD-10-CM | POA: Diagnosis not present

## 2021-05-08 LAB — POCT URINALYSIS DIP (MANUAL ENTRY)
Bilirubin, UA: NEGATIVE
Glucose, UA: NEGATIVE mg/dL
Ketones, POC UA: NEGATIVE mg/dL
Nitrite, UA: POSITIVE — AB
Protein Ur, POC: >=300 mg/dL — AB
Spec Grav, UA: >=1.03 — AB (ref 1.010–1.025)
Urobilinogen, UA: 0.2 E.U./dL
pH, UA: 6 (ref 5.0–8.0)

## 2021-05-08 MED ORDER — PHENAZOPYRIDINE HCL 200 MG PO TABS
200.0000 mg | ORAL_TABLET | Freq: Three times a day (TID) | ORAL | 0 refills | Status: DC
Start: 2021-05-08 — End: 2021-09-28

## 2021-05-08 MED ORDER — CEPHALEXIN 500 MG PO CAPS: 500.0000 mg | ORAL_CAPSULE | Freq: Two times a day (BID) | ORAL | 0 refills | Status: AC

## 2021-05-08 NOTE — ED Notes (Signed)
Triaged by provider  

## 2021-05-08 NOTE — Discharge Instructions (Addendum)
Take medication as prescribed Drink plenty of fluids Return for evaluation if symptoms become worse Will send out urine culture and change treatment plan if needed.

## 2021-05-08 NOTE — ED Provider Notes (Signed)
[facility]    CSN: [id number] Arrival date & time: 05/08/21  1124      History   Chief Complaint Chief Complaint  Patient presents with   Dysuria    HPI Lori Montoya is a 70 y.o. female.   Pt complains of dysuria and increased urinary frequency that started about 4 days ago.  Denies hematuria, abdominal pain, flank pain, fever, chills.  She has taken nothing for the sx.     Past Medical History:  Diagnosis Date   Elevated alkaline phosphatase level    GERD (gastroesophageal reflux disease)    Hyperlipidemia    Osteoporosis    Other and unspecified disc disorder of lumbar region    Personal history of other disorders of nervous system and sense organs    Seizures ([place])    last seizure in 1977   Tobacco use disorder    Unspecified hypothyroidism     Patient Active Problem List   Diagnosis Date Noted   Adjustment disorder with mixed anxiety and depressed mood 01/15/2019   Sore throat 10/29/2018   Gastroesophageal reflux disease without esophagitis 09/13/2018   Elevated LDL cholesterol level 05/13/2018   Dysfunction of eustachian tube 05/13/2018   Hypertriglyceridemia 05/13/2018   Varicose veins of both legs with edema 05/13/2018   Status post breast reduction 03/19/2018   Sclerosing adenosis of breast, right 03/05/2018   Fibrocystic breast changes, bilateral 03/05/2018   Elevated liver enzymes 09/17/2017   Other fatigue 02/20/2017   Myalgia 02/20/2017   Vitamin D insufficiency 11/03/2016   Smoking greater than 30 pack years-  quit Dec 2015 09/12/2016   Hypothyroidism 09/12/2016   Hyperlipidemia 09/12/2016   COPD (chronic obstructive pulmonary disease) ? 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   Intermittent palpitations 07/20/2014    Frequent unifocal PVCs 07/20/2014    Past Surgical History:  Procedure Laterality Date   BREAST EXCISIONAL BIOPSY Right    benign   BREAST LUMPECTOMY WITH RADIOACTIVE SEED LOCALIZATION Right 02/28/2018   Procedure: RIGHT BREAST LUMPECTOMY WITH RADIOACTIVE SEED LOCALIZATION ERAS PATHWAY;  Surgeon: Erroll Luna, MD;  Location: [place];  Service: General;  Laterality: Right;   BREAST REDUCTION SURGERY Bilateral 02/28/2018   Procedure: BILATERA BREAST REDUCTION FOR SYMMERTY;  Surgeon: Wallace Going, DO;  Location: [place];  Service: Plastics;  Laterality: Bilateral;   CHOLECYSTECTOMY  2011   lap choli   COLONOSCOPY     EYE SURGERY Bilateral    cataract extraction   MICROLARYNGOSCOPY Left 01/01/2015   Procedure: MICROLARYNGOSCOPY WITH EXCISION OF LEFT VOCAL CORD NODULE ;  Surgeon: Jerrell Belfast, MD;  Location: [place];  Service: ENT;  Laterality: Left;   REDUCTION MAMMAPLASTY Left     OB History   No obstetric history on file.      Home Medications    Prior to Admission medications   Medication Sig Start Date End Date Taking? Authorizing Provider  Ascorbic Acid (VITAMIN C) 500 MG CHEW  10/01/19   [provider]  calcium carbonate (OSCAL) 1500 (600 Ca) MG TABS tablet Take by mouth.    [provider]  Cholecalciferol (VITAMIN D3) 1000 units CAPS Take 5 capsules (5,000 Units total) by mouth daily. 11/03/16   Mellody Dance, DO  levothyroxine (SYNTHROID) 100  MCG tablet TAKE 1 TABLET (100 MCG TOTAL) BY MOUTH DAILY BEFORE BREAKFAST. 04/25/21   Abonza, Maritza, PA-C  simvastatin (ZOCOR) 20 MG tablet TAKE 1 TABLET (20 MG TOTAL) BY MOUTH AT BEDTIME. 04/11/21   Lorrene Reid, PA-C    Family History Family History  Problem Relation Age of Onset   Hyperlipidemia Mother    Hypertension Mother    CVA Mother    Alcohol abuse Mother    Dementia Mother    Hypothyroidism Mother    Cancer - Other Father         liver   Alcohol abuse Brother    ALS Brother    Dementia Maternal Grandmother    Stroke Maternal Grandfather    Breast cancer Paternal Aunt        unsure of age   Breast cancer Paternal 41        unsure of age    Social History Social History   Tobacco Use   Smoking status: Former    Packs/day: 1.00    Years: 48.00    Pack years: 48.00    Types: Cigarettes    Quit date: 10/31/2014    Years since quitting: 6.5   Smokeless tobacco: Never   Tobacco comments:    havent smoked in three weeks  Vaping Use   Vaping Use: Never used  Substance Use Topics   Alcohol use: No   Drug use: No     Allergies   Alendronate, Prozac [fluoxetine], and Zetia [ezetimibe]   Review of Systems Review of Systems  Constitutional:  Negative for chills and fever.  HENT:  Negative for ear pain and sore throat.   Eyes:  Negative for pain and visual disturbance.  Respiratory:  Negative for cough and shortness of breath.   Cardiovascular:  Negative for chest pain and palpitations.  Gastrointestinal:  Negative for abdominal pain and vomiting.  Genitourinary:  Positive for dysuria and frequency. Negative for hematuria, vaginal discharge and vaginal pain.  Musculoskeletal:  Negative for arthralgias and back pain.  Skin:  Negative for color change and rash.  Neurological:  Negative for seizures and syncope.  All other systems reviewed and are negative.   Physical Exam Triage Vital Signs ED Triage Vitals  Enc Vitals Group     BP      Pulse      Resp      Temp      Temp src      SpO2      Weight      Height      Head Circumference      Peak Flow      Pain Score      Pain Loc      Pain Edu?      Excl. in [place]?    No data found.  Updated Vital Signs There were no vitals taken for this visit.  Visual Acuity Right Eye Distance:   Left Eye Distance:   Bilateral Distance:    Right Eye Near:   Left Eye Near:    Bilateral Near:     Physical Exam Vitals and nursing note  reviewed.  Constitutional:      General: She is not in acute distress.    Appearance: She is well-developed.  HENT:     Head: Normocephalic and atraumatic.  Eyes:     Conjunctiva/sclera: Conjunctivae normal.  Cardiovascular:     Rate and Rhythm: Normal rate and regular rhythm.     Heart sounds: No  murmur heard. Pulmonary:     Effort: Pulmonary effort is normal. No respiratory distress.     Breath sounds: Normal breath sounds.  Abdominal:     Palpations: Abdomen is soft.     Tenderness: There is no abdominal tenderness. There is no right CVA tenderness or left CVA tenderness.  Musculoskeletal:     Cervical back: Neck supple.  Skin:    General: Skin is warm and dry.  Neurological:     Mental Status: She is alert.     UC Treatments / Results  Labs (all labs ordered are listed, but only abnormal results are displayed) Labs Reviewed  POCT URINALYSIS DIP (MANUAL ENTRY) - Abnormal; Notable for the following components:      Result Value   Clarity, UA cloudy (*)    Spec Grav, UA >=1.030 (*)    Blood, UA large (*)    Protein Ur, POC >=300 (*)    Nitrite, UA Positive (*)    Leukocytes, UA Small (1+) (*)    All other components within normal limits    EKG   Radiology No results found.  Procedures Procedures (including critical care time)  Medications Ordered in UC Medications - No data to display  Initial Impression / Assessment and Plan / UC Course  I have reviewed the triage vital signs and the nursing notes.  Pertinent labs & imaging results that were available during my care of the patient were reviewed by me and considered in my medical decision making (see chart for details).     UTI will treat with macrobid and pyridium. Will sent urine culture and change treatment plan if needed.  Return precautions discussed.  Final Clinical Impressions(s) / UC Diagnoses   Final diagnoses:  Acute cystitis without hematuria   Discharge Instructions   None    ED  Prescriptions   None    PDMP not reviewed this encounter.   Konrad Felix, PA-C 05/08/21 1218

## 2021-05-11 LAB — URINE CULTURE: Culture: >=100000 — AB

## 2021-07-16 ENCOUNTER — Other Ambulatory Visit: Payer: Self-pay

## 2021-07-16 ENCOUNTER — Ambulatory Visit
Admission: RE | Admit: 2021-07-16 | Discharge: 2021-07-16 | Disposition: A | Discharge status: Home / Self Care | Payer: Medicare Other | Source: Ambulatory Visit | Attending: Internal Medicine | Admitting: Internal Medicine

## 2021-07-16 VITALS — BP 130/86 | HR 99 | Temp 98.5°F | Resp 16

## 2021-07-16 DIAGNOSIS — R42 Dizziness and giddiness: Secondary | ICD-10-CM | POA: Diagnosis not present

## 2021-07-16 DIAGNOSIS — H6593 Unspecified nonsuppurative otitis media, bilateral: Secondary | ICD-10-CM | POA: Diagnosis not present

## 2021-07-16 DIAGNOSIS — J029 Acute pharyngitis, unspecified: Secondary | ICD-10-CM | POA: Diagnosis not present

## 2021-07-16 LAB — POCT RAPID STREP A (OFFICE): Rapid Strep A Screen: NEGATIVE

## 2021-07-16 MED ORDER — CETIRIZINE HCL 10 MG PO TABS: 10.0000 mg | ORAL_TABLET | Freq: Every day | ORAL | 0 refills | Status: DC

## 2021-07-16 MED ORDER — FLUTICASONE PROPIONATE 50 MCG/ACT NA SUSP: 1.0000 | Freq: Every day | NASAL | 0 refills | Status: DC

## 2021-07-16 NOTE — ED Triage Notes (Signed)
Right ear pain / fullness with sore throat x 3 days. States since this started she's occasionally felt off-balanced or dizzy with a mild headache.

## 2021-07-16 NOTE — Discharge Instructions (Addendum)
You have fluid in your ears that is most likely causing ear pain and dizziness. Will treat with cetirizine and flonase. Be aware that cetirizine can cause drowsiness at times. Rapid strep test was negative. Throat culture and covid 19 viral swab are pending. We will call if these are positive.

## 2021-07-16 NOTE — ED Provider Notes (Signed)
[facility]    CSN: [id number] Arrival date & time: 07/16/21  1338      History   Chief Complaint Chief Complaint  Patient presents with   Ear Fullness   Sore Throat    HPI Lori Montoya is a 70 y.o. female.   Patient presents with 3 day history of sore throat and ear discomfort. Has also been having intermittent dizziness when walking that occurred twice since symptoms started. Denies any nasal congestion, runny nose, or fever. Denies any known sick contacts. Denies any headache, blurred vision, chest pain, or shortness of breath. Has taken sudafed OTC with no relief of symptoms.    Ear Fullness  Sore Throat   Past Medical History:  Diagnosis Date   Elevated alkaline phosphatase level    GERD (gastroesophageal reflux disease)    Hyperlipidemia    Osteoporosis    Other and unspecified disc disorder of lumbar region    Personal history of other disorders of nervous system and sense organs    Seizures ([place])    last seizure in 1977   Tobacco use disorder    Unspecified hypothyroidism     Patient Active Problem List   Diagnosis Date Noted   Adjustment disorder with mixed anxiety and depressed mood 01/15/2019   Sore throat 10/29/2018   Gastroesophageal reflux disease without esophagitis 09/13/2018   Elevated LDL cholesterol level 05/13/2018   Dysfunction of eustachian tube 05/13/2018   Hypertriglyceridemia 05/13/2018   Varicose veins of both legs with edema 05/13/2018   Status post breast reduction 03/19/2018   Sclerosing adenosis of breast, right 03/05/2018   Fibrocystic breast changes, bilateral 03/05/2018   Elevated liver enzymes 09/17/2017   Other fatigue 02/20/2017   Myalgia 02/20/2017   Vitamin D insufficiency 11/03/2016   Smoking greater than 30 pack years-  quit Dec 2015 09/12/2016   Hypothyroidism 09/12/2016   Hyperlipidemia 09/12/2016   COPD (chronic obstructive pulmonary disease) ? 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   Intermittent palpitations 07/20/2014   Frequent unifocal PVCs 07/20/2014    Past Surgical History:  Procedure Laterality Date   BREAST EXCISIONAL BIOPSY Right    benign   BREAST LUMPECTOMY WITH RADIOACTIVE SEED LOCALIZATION Right 02/28/2018   Procedure: RIGHT BREAST LUMPECTOMY WITH RADIOACTIVE SEED LOCALIZATION ERAS PATHWAY;  Surgeon: Erroll Luna, MD;  Location: [facility];  Service: General;  Laterality: Right;   BREAST REDUCTION SURGERY Bilateral 02/28/2018   Procedure: BILATERA BREAST REDUCTION FOR SYMMERTY;  Surgeon: Wallace Going, DO;  Location: [place] [facility];  Service: Plastics;  Laterality: Bilateral;   CHOLECYSTECTOMY  2011   lap choli   COLONOSCOPY     EYE SURGERY Bilateral    cataract extraction   MICROLARYNGOSCOPY Left 01/01/2015   Procedure: MICROLARYNGOSCOPY WITH EXCISION OF LEFT VOCAL CORD NODULE ;  Surgeon: Jerrell Belfast, MD;  Location: [facility];  Service: ENT;  Laterality: Left;   REDUCTION MAMMAPLASTY Left     OB History   No obstetric history on file.      Home Medications    Prior to Admission medications   Medication Sig Start Date End Date Taking? Authorizing Provider  cetirizine (ZYRTEC) 10 MG tablet Take 1 tablet (10 mg total) by mouth daily. 07/16/21  Yes Odis Luster,  FNP  fluticasone (FLONASE) 50 MCG/ACT nasal spray Place 1 spray into both nostrils daily. Limit use to 3 days 07/16/21  Yes Odis Luster, FNP  Ascorbic Acid (VITAMIN C) 500 MG CHEW  10/01/19   [provider]  calcium carbonate (OSCAL) 1500 (600 Ca) MG TABS tablet Take by mouth.    [provider]  Cholecalciferol (VITAMIN D3) 1000 units CAPS Take 5 capsules (5,000 Units total) by mouth daily. 11/03/16    Mellody Dance, DO  levothyroxine (SYNTHROID) 100 MCG tablet TAKE 1 TABLET (100 MCG TOTAL) BY MOUTH DAILY BEFORE BREAKFAST. 04/25/21   Abonza, Maritza, PA-C  phenazopyridine (PYRIDIUM) 200 MG tablet Take 1 tablet (200 mg total) by mouth 3 (three) times daily. 05/08/21   Ward, Lenise Arena, PA-C  simvastatin (ZOCOR) 20 MG tablet TAKE 1 TABLET (20 MG TOTAL) BY MOUTH AT BEDTIME. 04/11/21   Lorrene Reid, PA-C    Family History Family History  Problem Relation Age of Onset   Hyperlipidemia Mother    Hypertension Mother    CVA Mother    Alcohol abuse Mother    Dementia Mother    Hypothyroidism Mother    Cancer - Other Father        liver   Alcohol abuse Brother    ALS Brother    Dementia Maternal Grandmother    Stroke Maternal Grandfather    Breast cancer Paternal Aunt        unsure of age   Breast cancer Paternal 35        unsure of age    Social History Social History   Tobacco Use   Smoking status: Former    Packs/day: 1.00    Years: 48.00    Pack years: 48.00    Types: Cigarettes    Quit date: 10/31/2014    Years since quitting: 6.7   Smokeless tobacco: Never   Tobacco comments:    havent smoked in three weeks  Vaping Use   Vaping Use: Never used  Substance Use Topics   Alcohol use: No   Drug use: No     Allergies   Alendronate, Prozac [fluoxetine], and Zetia [ezetimibe]   Review of Systems Review of Systems Per HPI  Physical Exam Triage Vital Signs ED Triage Vitals [07/16/21 1404]  Enc Vitals Group     BP 130/86     Pulse Rate 99     Resp 16     Temp 98.5 F (36.9 C)     Temp Source Oral     SpO2 97 %     Weight      Height      Head Circumference      Peak Flow      Pain Score 4     Pain Loc      Pain Edu?      Excl. in [place]?    No data found.  Updated Vital Signs BP 130/86 (BP Location: Left Arm)   Pulse 99   Temp 98.5 F (36.9 C) (Oral)   Resp 16   SpO2 97%   Visual Acuity Right Eye Distance:   Left Eye Distance:   Bilateral  Distance:    Right Eye Near:   Left Eye Near:    Bilateral Near:     Physical Exam Constitutional:      General: She is not in acute distress.    Appearance: Normal appearance.  HENT:     Head: Normocephalic and atraumatic.     Right  Ear: Ear canal normal. A middle ear effusion is present. Tympanic membrane is not erythematous or bulging.     Left Ear: Ear canal normal. A middle ear effusion is present. Tympanic membrane is not erythematous or bulging.     Nose: Nose normal.     Mouth/Throat:     Mouth: Mucous membranes are moist.     Pharynx: Posterior oropharyngeal erythema present.  Eyes:     Extraocular Movements: Extraocular movements intact.     Conjunctiva/sclera: Conjunctivae normal.     Pupils: Pupils are equal, round, and reactive to light.  Cardiovascular:     Rate and Rhythm: Normal rate and regular rhythm.     Pulses: Normal pulses.     Heart sounds: Normal heart sounds.  Pulmonary:     Effort: Pulmonary effort is normal. No respiratory distress.     Breath sounds: Normal breath sounds. No wheezing.  Abdominal:     General: Abdomen is flat. Bowel sounds are normal.     Palpations: Abdomen is soft.  Musculoskeletal:        General: Normal range of motion.     Cervical back: Normal range of motion.  Skin:    General: Skin is warm and dry.  Neurological:     General: No focal deficit present.     Mental Status: She is alert and oriented to person, place, and time. Mental status is at baseline.     Cranial Nerves: Cranial nerves are intact.     Sensory: Sensation is intact.     Motor: Motor function is intact.     Coordination: Coordination is intact.     Gait: Gait is intact.  Psychiatric:        Mood and Affect: Mood normal.        Behavior: Behavior normal.     UC Treatments / Results  Labs (all labs ordered are listed, but only abnormal results are displayed) Labs Reviewed  CULTURE, GROUP A STREP ([place])  NOVEL CORONAVIRUS, NAA  POCT RAPID STREP A  (OFFICE)    EKG   Radiology No results found.  Procedures Procedures (including critical care time)  Medications Ordered in UC Medications - No data to display  Initial Impression / Assessment and Plan / UC Course  I have reviewed the triage vital signs and the nursing notes.  Pertinent labs & imaging results that were available during my care of the patient were reviewed by me and considered in my medical decision making (see chart for details).     Suspect that dizziness is occurring due to bilateral middle ear effusions. Will treat with cetirizine and flonase. Advised patient that cetirizine may cause drowsiness. Rapid strep test was negative. Throat culture and covid 19 swab are pending. Discussed strict return precautions. Patient verbalized understanding and is agreeable with plan.  Final Clinical Impressions(s) / UC Diagnoses   Final diagnoses:  Sore throat  Fluid level behind tympanic membrane of both ears  Dizziness and giddiness     Discharge Instructions      You have fluid in your ears that is most likely causing ear pain and dizziness. Will treat with cetirizine and flonase. Be aware that cetirizine can cause drowsiness at times. Rapid strep test was negative. Throat culture and covid 19 viral swab are pending. We will call if these are positive.      ED Prescriptions     Medication Sig Dispense Auth. Provider   cetirizine (ZYRTEC) 10 MG tablet Take 1 tablet (10  mg total) by mouth daily. 30 tablet Odis Luster, FNP   fluticasone (FLONASE) 50 MCG/ACT nasal spray Place 1 spray into both nostrils daily. Limit use to 3 days 16 g Odis Luster, FNP      PDMP not reviewed this encounter.   Odis Luster, FNP 07/16/21 1505

## 2021-07-17 LAB — SARS-COV-2, NAA 2 DAY TAT

## 2021-07-17 LAB — NOVEL CORONAVIRUS, NAA: SARS-CoV-2, NAA: NOT DETECTED

## 2021-07-19 LAB — CULTURE, GROUP A STREP (THRC)

## 2021-07-21 ENCOUNTER — Other Ambulatory Visit: Payer: Self-pay | Admitting: Physician Assistant

## 2021-08-16 ENCOUNTER — Other Ambulatory Visit: Payer: Self-pay | Admitting: Physician Assistant

## 2021-09-23 ENCOUNTER — Other Ambulatory Visit: Payer: Self-pay

## 2021-09-23 DIAGNOSIS — E039 Hypothyroidism, unspecified: Secondary | ICD-10-CM

## 2021-09-23 DIAGNOSIS — Z0000 Encounter for general adult medical examination without abnormal findings: Secondary | ICD-10-CM

## 2021-09-23 DIAGNOSIS — E785 Hyperlipidemia, unspecified: Secondary | ICD-10-CM

## 2021-09-23 DIAGNOSIS — E7800 Pure hypercholesterolemia, unspecified: Secondary | ICD-10-CM

## 2021-09-26 ENCOUNTER — Other Ambulatory Visit: Payer: Self-pay

## 2021-09-26 ENCOUNTER — Other Ambulatory Visit: Payer: Medicare Other

## 2021-09-26 DIAGNOSIS — E7800 Pure hypercholesterolemia, unspecified: Secondary | ICD-10-CM

## 2021-09-26 DIAGNOSIS — E039 Hypothyroidism, unspecified: Secondary | ICD-10-CM | POA: Diagnosis not present

## 2021-09-26 DIAGNOSIS — Z0000 Encounter for general adult medical examination without abnormal findings: Secondary | ICD-10-CM | POA: Diagnosis not present

## 2021-09-26 DIAGNOSIS — E785 Hyperlipidemia, unspecified: Secondary | ICD-10-CM

## 2021-09-27 LAB — LIPID PANEL
Chol/HDL Ratio: 3.9 ratio (ref 0.0–4.4)
Cholesterol, Total: 166 mg/dL (ref 100–199)
HDL: 43 mg/dL (ref >39)
LDL Chol Calc (NIH): 103 mg/dL — ABNORMAL HIGH (ref 0–99)
Triglycerides: 110 mg/dL (ref 0–149)
VLDL Cholesterol Cal: 20 mg/dL (ref 5–40)

## 2021-09-27 LAB — CBC WITH DIFFERENTIAL/PLATELET
Basophils Absolute: 0 10*3/uL (ref 0.0–0.2)
Basos: 1 %
EOS (ABSOLUTE): 0.4 10*3/uL (ref 0.0–0.4)
Eos: 6 %
Hematocrit: 43.1 % (ref 34.0–46.6)
Hemoglobin: 14.2 g/dL (ref 11.1–15.9)
Immature Grans (Abs): 0 10*3/uL (ref 0.0–0.1)
Immature Granulocytes: 0 %
Lymphocytes Absolute: 1.6 10*3/uL (ref 0.7–3.1)
Lymphs: 23 %
MCH: 30.5 pg (ref 26.6–33.0)
MCHC: 32.9 g/dL (ref 31.5–35.7)
MCV: 93 fL (ref 79–97)
Monocytes Absolute: 0.8 10*3/uL (ref 0.1–0.9)
Monocytes: 11 %
Neutrophils Absolute: 4.2 10*3/uL (ref 1.4–7.0)
Neutrophils: 59 %
Platelets: 220 10*3/uL (ref 150–450)
RBC: 4.66 x10E6/uL (ref 3.77–5.28)
RDW: 12.6 % (ref 11.7–15.4)
WBC: 7 10*3/uL (ref 3.4–10.8)

## 2021-09-27 LAB — COMPREHENSIVE METABOLIC PANEL
ALT: 27 IU/L (ref 0–32)
AST: 38 IU/L (ref 0–40)
Albumin/Globulin Ratio: 1.5 (ref 1.2–2.2)
Albumin: 4.2 g/dL (ref 3.8–4.8)
Alkaline Phosphatase: 178 IU/L — ABNORMAL HIGH (ref 44–121)
BUN/Creatinine Ratio: 21 (ref 12–28)
BUN: 16 mg/dL (ref 8–27)
Bilirubin Total: 0.5 mg/dL (ref 0.0–1.2)
CO2: 25 mmol/L (ref 20–29)
Calcium: 9.8 mg/dL (ref 8.7–10.3)
Chloride: 103 mmol/L (ref 96–106)
Creatinine, Ser: 0.78 mg/dL (ref 0.57–1.00)
Globulin, Total: 2.8 g/dL (ref 1.5–4.5)
Glucose: 86 mg/dL (ref 70–99)
Potassium: 4.7 mmol/L (ref 3.5–5.2)
Sodium: 140 mmol/L (ref 134–144)
Total Protein: 7 g/dL (ref 6.0–8.5)
eGFR: 82 mL/min/{1.73_m2} (ref >59)

## 2021-09-27 LAB — HEMOGLOBIN A1C
Est. average glucose Bld gHb Est-mCnc: 100 mg/dL
Hgb A1c MFr Bld: 5.1 % (ref 4.8–5.6)

## 2021-09-27 LAB — TSH: TSH: 0.604 u[IU]/mL (ref 0.450–4.500)

## 2021-09-28 ENCOUNTER — Encounter: Payer: Self-pay | Admitting: Physician Assistant

## 2021-09-28 ENCOUNTER — Other Ambulatory Visit: Payer: Self-pay

## 2021-09-28 ENCOUNTER — Ambulatory Visit (INDEPENDENT_AMBULATORY_CARE_PROVIDER_SITE_OTHER): Payer: Medicare Other | Admitting: Physician Assistant

## 2021-09-28 VITALS — BP 142/82 | HR 62 | Temp 98.0°F | Ht 67.0 in | Wt 160.0 lb

## 2021-09-28 DIAGNOSIS — K219 Gastro-esophageal reflux disease without esophagitis: Secondary | ICD-10-CM

## 2021-09-28 DIAGNOSIS — Z23 Encounter for immunization: Secondary | ICD-10-CM

## 2021-09-28 DIAGNOSIS — Z0000 Encounter for general adult medical examination without abnormal findings: Secondary | ICD-10-CM | POA: Diagnosis not present

## 2021-09-28 DIAGNOSIS — R002 Palpitations: Secondary | ICD-10-CM | POA: Diagnosis not present

## 2021-09-28 NOTE — Patient Instructions (Signed)
Preventive Care 40 Years and Older, Female Preventive care refers to lifestyle choices and visits with your health care provider that can promote health and wellness. Preventive care visits are also called wellness exams. What can I expect for my preventive care visit? Counseling Your health care provider may ask you questions about your: Medical history, including: Past medical problems. Family medical history. Pregnancy and menstrual history. History of falls. Current health, including: Memory and ability to understand (cognition). Emotional well-being. Home life and relationship well-being. Sexual activity and sexual health. Lifestyle, including: Alcohol, nicotine or tobacco, and drug use. Access to firearms. Diet, exercise, and sleep habits. Work and work Statistician. Sunscreen use. Safety issues such as seatbelt and bike helmet use. Physical exam Your health care provider will check your: Height and weight. These may be used to calculate your BMI (body mass index). BMI is a measurement that tells if you are at a healthy weight. Waist circumference. This measures the distance around your waistline. This measurement also tells if you are at a healthy weight and may help predict your risk of certain diseases, such as type 2 diabetes and high blood pressure. Heart rate and blood pressure. Body temperature. Skin for abnormal spots. What immunizations do I need? Vaccines are usually given at various ages, according to a schedule. Your health care provider will recommend vaccines for you based on your age, medical history, and lifestyle or other factors, such as travel or where you work. What tests do I need? Screening Your health care provider may recommend screening tests for certain conditions. This may include: Lipid and cholesterol levels. Hepatitis C test. Hepatitis B test. HIV (human immunodeficiency virus) test. STI (sexually transmitted infection) testing, if you are at  risk. Lung cancer screening. Colorectal cancer screening. Diabetes screening. This is done by checking your blood sugar (glucose) after you have not eaten for a while (fasting). Mammogram. Talk with your health care provider about how often you should have regular mammograms. BRCA-related cancer screening. This may be done if you have a family history of breast, ovarian, tubal, or peritoneal cancers. Bone density scan. This is done to screen for osteoporosis. Talk with your health care provider about your test results, treatment options, and if necessary, the need for more tests. Follow these instructions at home: Eating and drinking  Eat a diet that includes fresh fruits and vegetables, whole grains, lean protein, and low-fat dairy products. Limit your intake of foods with high amounts of sugar, saturated fats, and salt. Take vitamin and mineral supplements as recommended by your health care provider. Do not drink alcohol if your health care provider tells you not to drink. If you drink alcohol: Limit how much you have to 0-1 drink a day. Know how much alcohol is in your drink. In the [place] one drink equals one 12 oz bottle of beer (355 mL), one 5 oz glass of wine (148 mL), or one 1 oz glass of hard liquor (44 mL). Lifestyle Brush your teeth every morning and night with fluoride toothpaste. Floss one time each day. Exercise for at least 30 minutes 5 or more days each week. Do not use any products that contain nicotine or tobacco. These products include cigarettes, chewing tobacco, and vaping devices, such as e-cigarettes. If you need help quitting, ask your health care provider. Do not use drugs. If you are sexually active, practice safe sex. Use a condom or other form of protection in order to prevent STIs. Take aspirin only as told by your  health care provider. Make sure that you understand how much to take and what form to take. Work with your health care provider to find out whether it  is safe and beneficial for you to take aspirin daily. Ask your health care provider if you need to take a cholesterol-lowering medicine (statin). Find healthy ways to manage stress, such as: Meditation, yoga, or listening to music. Journaling. Talking to a trusted person. Spending time with friends and family. Minimize exposure to UV radiation to reduce your risk of skin cancer. Safety Always wear your seat belt while driving or riding in a vehicle. Do not drive: If you have been drinking alcohol. Do not ride with someone who has been drinking. When you are tired or distracted. While texting. If you have been using any mind-altering substances or drugs. Wear a helmet and other protective equipment during sports activities. If you have firearms in your house, make sure you follow all gun safety procedures. What's next? Visit your health care provider once a year for an annual wellness visit. Ask your health care provider how often you should have your eyes and teeth checked. Stay up to date on all vaccines. This information is not intended to replace advice given to you by your health care provider. Make sure you discuss any questions you have with your health care provider. Document Revised: 05/04/2021 Document Reviewed: 05/04/2021 Elsevier Patient Education  [place].

## 2021-09-28 NOTE — Progress Notes (Signed)
Subjective:   Lori Montoya is a 70 y.o. female who presents for Medicare Annual (Subsequent) preventive examination.  Review of Systems    General:   No F/C, wt loss Pulm:   No DIB, SOB, pleuritic chest pain Card:  No CP, +palpitations (feels skipped heart beat) Abd:  No n/v/d or pain, +reflux and constantly clearing throat  Ext:  No inc edema from baseline    Objective:    Today's Vitals   09/28/21 1344  BP: (!) 142/82  Pulse: 62  Temp: 98 F (36.7 C)  SpO2: 99%  Weight: 160 lb (72.6 kg)  Height: 5\' 7"  (1.702 m)   Body mass index is 25.06 kg/m.  Advanced Directives 02/28/2018 02/19/2018 05/16/2017 12/30/2014  Does Patient Have a Medical Advance Directive? No No No No  Would patient like information on creating a medical advance directive? No - Patient declined - No - Patient declined No - patient declined information    Current Medications (verified) Outpatient Encounter Medications as of 09/28/2021  Medication Sig   Ascorbic Acid (VITAMIN C) 500 MG CHEW    calcium carbonate (OSCAL) 1500 (600 Ca) MG TABS tablet Take by mouth.   Cholecalciferol (VITAMIN D3) 1000 units CAPS Take 5 capsules (5,000 Units total) by mouth daily.   levothyroxine (SYNTHROID) 100 MCG tablet TAKE 1 TABLET (100 MCG TOTAL) BY MOUTH DAILY BEFORE BREAKFAST.   Menaquinone-7 (VITAMIN K2 PO) Take 90 mg by mouth daily.   simvastatin (ZOCOR) 20 MG tablet TAKE 1 TABLET (20 MG TOTAL) BY MOUTH AT BEDTIME.   Ubiquinol 100 MG CAPS Take 1 capsule by mouth daily.   [DISCONTINUED] cetirizine (ZYRTEC) 10 MG tablet Take 1 tablet (10 mg total) by mouth daily.   [DISCONTINUED] fluticasone (FLONASE) 50 MCG/ACT nasal spray Place 1 spray into both nostrils daily. Limit use to 3 days   [DISCONTINUED] phenazopyridine (PYRIDIUM) 200 MG tablet Take 1 tablet (200 mg total) by mouth 3 (three) times daily.   No facility-administered encounter medications on file as of 09/28/2021.    Allergies (verified) Alendronate, Prozac  [fluoxetine], and Zetia [ezetimibe]   History: Past Medical History:  Diagnosis Date   Elevated alkaline phosphatase level    GERD (gastroesophageal reflux disease)    Hyperlipidemia    Osteoporosis    Other and unspecified disc disorder of lumbar region    Personal history of other disorders of nervous system and sense organs    Seizures ([place])    last seizure in 1977   Tobacco use disorder    Unspecified hypothyroidism    Past Surgical History:  Procedure Laterality Date   BREAST EXCISIONAL BIOPSY Right    benign   BREAST LUMPECTOMY WITH RADIOACTIVE SEED LOCALIZATION Right 02/28/2018   Procedure: RIGHT BREAST LUMPECTOMY WITH RADIOACTIVE SEED LOCALIZATION ERAS PATHWAY;  Surgeon: Erroll Luna, MD;  Location: [facility];  Service: General;  Laterality: Right;   BREAST REDUCTION SURGERY Bilateral 02/28/2018   Procedure: BILATERA BREAST REDUCTION FOR SYMMERTY;  Surgeon: Wallace Going, DO;  Location: [facility];  Service: Plastics;  Laterality: Bilateral;   CHOLECYSTECTOMY  2011   lap choli   COLONOSCOPY     EYE SURGERY Bilateral    cataract extraction   MICROLARYNGOSCOPY Left 01/01/2015   Procedure: MICROLARYNGOSCOPY WITH EXCISION OF LEFT VOCAL CORD NODULE ;  Surgeon: Jerrell Belfast, MD;  Location: [facility];  Service: ENT;  Laterality: Left;   REDUCTION MAMMAPLASTY Left    Family History  Problem Relation  Age of Onset   Hyperlipidemia Mother    Hypertension Mother    CVA Mother    Alcohol abuse Mother    Dementia Mother    Hypothyroidism Mother    Cancer - Other Father        liver   Alcohol abuse Brother    ALS Brother    Dementia Maternal Grandmother    Stroke Maternal Grandfather    Breast cancer Paternal Aunt        unsure of age   Breast cancer Paternal 40        unsure of age   Social History   Socioeconomic History   Marital status: Widowed    Spouse name: Not on file   Number of children: Not on file    Years of education: Not on file   Highest education level: Not on file  Occupational History   Not on file  Tobacco Use   Smoking status: Former    Packs/day: 1.00    Years: 48.00    Pack years: 48.00    Types: Cigarettes    Quit date: 10/31/2014    Years since quitting: 6.9   Smokeless tobacco: Never   Tobacco comments:    havent smoked in three weeks  Vaping Use   Vaping Use: Never used  Substance and Sexual Activity   Alcohol use: No   Drug use: No   Sexual activity: Never    Comment: last cig 10/31/14  Other Topics Concern   Not on file  Social History Narrative   Not on file   Social Determinants of Health   Financial Resource Strain: Not on file  Food Insecurity: Not on file  Transportation Needs: Not on file  Physical Activity: Not on file  Stress: Not on file  Social Connections: Not on file    Tobacco Counseling Counseling given: Not Answered Tobacco comments: havent smoked in three weeks     Diabetic?no         Activities of Daily Living In your present state of health, do you have any difficulty performing the following activities: 09/28/2021 03/28/2021  Hearing? N N  Vision? N N  Difficulty concentrating or making decisions? N N  Walking or climbing stairs? N N  Dressing or bathing? N N  Doing errands, shopping? N N  Some recent data might be hidden    Patient Care Team: Lorrene Reid, PA-C as PCP - General Teena Irani, MD (Inactive) as Consulting Physician (Gastroenterology) Jerrell Belfast, MD as Consulting Physician (Otolaryngology) Darlin Coco, MD (Cardiology) Dillingham, Loel Lofty, DO as Attending Physician (Plastic Surgery) Mcarthur Rossetti, MD as Consulting Physician (Orthopedic Surgery)  Indicate any recent Medical Services you may have received from other than [facility] providers in the past year (date may be approximate).     Assessment:   This is a routine wellness examination for Lori Montoya.  Hearing/Vision  screen No results found.  Dietary issues and exercise activities discussed: -Heart healthy diet low in fat and carbohydrates. Continue to stay as active as possible with walking and other house activities.    Goals Addressed   None   Depression Screen PHQ 2/9 Scores 09/28/2021 03/28/2021 09/28/2020 08/23/2020 06/29/2020 05/07/2020 03/22/2020  PHQ - 2 Score 0 0 0 - 1 0 0  PHQ- 9 Score 1 1 3  - 7 0 0  Exception Documentation - - - Patient refusal - - -    Fall Risk Fall Risk  09/28/2021 03/28/2021 09/28/2020 08/23/2020 06/29/2020  Falls in  the past year? 0 0 1 0 1  Number falls in past yr: 0 0 0 - -  Injury with Fall? 0 0 0 - -  Risk for fall due to : No Fall Risks No Fall Risks Other (Comment) - No Fall Risks  Risk for fall due to: Comment - - tripped over vaccuum cleaner - -  Follow up Falls evaluation completed Falls evaluation completed Falls evaluation completed Falls evaluation completed Falls evaluation completed    [place]:  Any stairs in or around the home? Yes  If so, are there any without handrails? No  Home free of loose throw rugs in walkways, pet beds, electrical cords, etc? Yes  Adequate lighting in your home to reduce risk of falls? Yes   ASSISTIVE DEVICES UTILIZED TO PREVENT FALLS:  Life alert? No  Use of a cane, walker or w/c? No  Grab bars in the bathroom? Yes  Shower chair or bench in shower? No  Elevated toilet seat or a handicapped toilet? No   TIMED UP AND GO:  Was the test performed? Yes .  Length of time to ambulate 10 feet: 12 sec.   Gait slow and steady without use of assistive device  Cognitive Function: wnl's      6CIT Screen 09/28/2021 09/28/2020 10/20/2019 05/16/2017  What Year? 0 points 0 points 0 points 0 points  What month? 0 points 0 points 0 points 0 points  What time? 0 points 0 points 0 points 0 points  Count back from 20 0 points 0 points 0 points 0 points  Months in reverse 0 points 0 points 0 points 0  points  Repeat phrase 0 points 0 points 0 points 0 points  Total Score 0 0 0 0    Immunizations Immunization History  Administered Date(s) Administered   Fluad Quad(high Dose 65+) 07/30/2019   Influenza, High Dose Seasonal PF 09/12/2016, 08/27/2017, 08/27/2018   Influenza,inj,Quad PF,6+ Mos 08/12/2020   PFIZER(Purple Top)SARS-COV-2 Vaccination 12/15/2019, 01/05/2020, 08/27/2020   Pneumococcal Conjugate-13 07/31/2016   Pneumococcal Polysaccharide-23 08/14/2009, 09/17/2017   Tdap 06/04/2013   Zoster Recombinat (Shingrix) 01/14/2018, 05/16/2018   Zoster, Live 12/07/2010    TDAP status: Up to date  Flu Vaccine status: Completed at today's visit  Pneumococcal vaccine status: Up to date  Covid-19 vaccine status: Completed vaccines  Qualifies for Shingles Vaccine? Yes   Zostavax completed Yes   Shingrix Completed?: Yes  Screening Tests Health Maintenance  Topic Date Due   COVID-19 Vaccine (4 - Booster for Pfizer series) 10/22/2020   INFLUENZA VACCINE  06/20/2021   MAMMOGRAM  11/18/2022   TETANUS/TDAP  06/05/2023   COLONOSCOPY (Pts 45-69yrs Insurance coverage will need to be confirmed)  06/01/2024   Pneumonia Vaccine 2+ Years old  Completed   DEXA SCAN  Completed   Hepatitis C Screening  Completed   Zoster Vaccines- Shingrix  Completed   HPV VACCINES  Aged Out    Health Maintenance  Health Maintenance Due  Topic Date Due   COVID-19 Vaccine (4 - Booster for [place] series) 10/22/2020   INFLUENZA VACCINE  06/20/2021    Colorectal cancer screening: Type of screening: Colonoscopy. Completed 06/01/2014. Repeat every 10 years  Mammogram status: Completed 11/18/2020. Repeat every year  Bone Density status: Completed 03/15/2020. Results reflect: Bone density results: OSTEOPOROSIS. Repeat every 3 years.  Lung Cancer Screening: (Low Dose CT Chest recommended if Age 57-80 years, 30 pack-year currently smoking OR have quit w/in 15years.) does qualify.  Lung Cancer Screening  Referral: pt deferred for the near future  Additional Screening:  Hepatitis C Screening: does qualify; Completed 08/01/2016  Vision Screening: Recommended annual ophthalmology exams for early detection of glaucoma and other disorders of the eye. Is the patient up to date with their annual eye exam?  Yes  Who is the provider or what is the name of the office in which the patient attends annual eye exams? [facility] in [place] If pt is not established with a provider, would they like to be referred to a provider to establish care? No .   Dental Screening: Recommended annual dental exams for proper oral hygiene  Community Resource Referral / Chronic Care Management: CRR required this visit?  No   CCM required this visit?  No      Plan:  -Discussed most recent lab results which are essentially within normal limits or stable from prior. LDL has mildly improved. -Will place referral to cardiology for palpitations. EKG obtained: sinus rhythm with premature supraventricular complexes, possible left atrial enlargement, incomplete right bundle branch block, left anterior fascicular block, left ventricular hypertrophy with repolarization abnormality, rate 69 bpm, no acute ST-T wave changes.  -Will place referral to gastroenterology for GERD. -Continue current medication regimen. -Follow up in 4 months for HLD, thyroid   I have personally reviewed and noted the following in the patient's chart:   Medical and social history Use of alcohol, tobacco or illicit drugs  Current medications and supplements including opioid prescriptions.  Functional ability and status Nutritional status Physical activity Advanced directives List of other physicians Hospitalizations, surgeries, and ER visits in previous 12 months Vitals Screenings to include cognitive, depression, and falls Referrals and appointments  In addition, I have reviewed and discussed with patient certain preventive protocols,  quality metrics, and best practice recommendations. A written personalized care plan for preventive services as well as general preventive health recommendations were provided to patient.     Lorrene Reid, PA-C   09/28/2021

## 2021-10-04 LAB — EKG 12-LEAD

## 2021-10-11 ENCOUNTER — Telehealth: Payer: Self-pay | Admitting: Physician Assistant

## 2021-10-11 DIAGNOSIS — K219 Gastro-esophageal reflux disease without esophagitis: Secondary | ICD-10-CM

## 2021-10-11 NOTE — Telephone Encounter (Signed)
Patient was referred to an endocrinologist however she does not want to go to the one referred to she would like to see Dr. Missy Sabins at [facility]. [phone]

## 2021-10-12 NOTE — Telephone Encounter (Signed)
Referral has been placed. AS, CMA 

## 2021-10-21 ENCOUNTER — Other Ambulatory Visit: Payer: Self-pay | Admitting: Physician Assistant

## 2021-12-29 ENCOUNTER — Ambulatory Visit: Payer: Medicare Other | Admitting: Physician Assistant

## 2022-01-04 ENCOUNTER — Other Ambulatory Visit: Payer: Self-pay | Admitting: Physician Assistant

## 2022-01-17 ENCOUNTER — Other Ambulatory Visit: Payer: Self-pay | Admitting: Physician Assistant

## 2022-01-26 ENCOUNTER — Ambulatory Visit: Payer: Medicare Other | Admitting: Physician Assistant

## 2022-02-01 NOTE — Progress Notes (Signed)
? ?Established Patient Office Visit ? ?Subjective:  ?Patient ID: Lori Montoya, female    DOB: May 13, 1951  Age: 71 y.o. MRN: [id number] ? ?CC:  ?Chief Complaint  ?Patient presents with  ? Follow-up  ? Hyperlipidemia  ? Thyroid Problem  ? ? ?HPI ?Lori Montoya presents for follow-up on hyperlipidemia and hypothyroidism.  ? ?HLD: Pt reports alternating Simvastatin. Takes 20 mg every other day and 10 mg the other days. States does still have muscle aches but are tolerable. Continues to follow a low fat diet and walking regimen. Not eating much sweets. Patient reports is in the process of moving and unsure exactly if moving to [place] or [employer]. ? ?Hypothyroidism: Reports medication compliance. Reports feels tired, which is not new symptom. Occasionally has palpitations and has an appointment scheduled with cardiology. No chest pain, shortness of breath, syncope or dizziness. ? ?Past Medical History:  ?Diagnosis Date  ? Elevated alkaline phosphatase level   ? GERD (gastroesophageal reflux disease)   ? Hyperlipidemia   ? Osteoporosis   ? Other and unspecified disc disorder of lumbar region   ? Personal history of other disorders of nervous system and sense organs   ? Seizures ([place])   ? last seizure in 1977  ? Tobacco use disorder   ? Unspecified hypothyroidism   ? ? ?Past Surgical History:  ?Procedure Laterality Date  ? BREAST EXCISIONAL BIOPSY Right   ? benign  ? BREAST LUMPECTOMY WITH RADIOACTIVE SEED LOCALIZATION Right 02/28/2018  ? Procedure: RIGHT BREAST LUMPECTOMY WITH RADIOACTIVE SEED LOCALIZATION ERAS PATHWAY;  Surgeon: Erroll Luna, MD;  Location: [facility];  Service: General;  Laterality: Right;  ? BREAST REDUCTION SURGERY Bilateral 02/28/2018  ? Procedure: BILATERA BREAST REDUCTION FOR SYMMERTY;  Surgeon: Wallace Going, DO;  Location: [facility];  Service: Plastics;  Laterality: Bilateral;  ? CHOLECYSTECTOMY  2011  ? lap choli  ? COLONOSCOPY    ? EYE SURGERY  Bilateral   ? cataract extraction  ? MICROLARYNGOSCOPY Left 01/01/2015  ? Procedure: MICROLARYNGOSCOPY WITH EXCISION OF LEFT VOCAL CORD NODULE ;  Surgeon: Jerrell Belfast, MD;  Location: [facility];  Service: ENT;  Laterality: Left;  ? REDUCTION MAMMAPLASTY Left   ? ? ?Family History  ?Problem Relation Age of Onset  ? Hyperlipidemia Mother   ? Hypertension Mother   ? CVA Mother   ? Alcohol abuse Mother   ? Dementia Mother   ? Hypothyroidism Mother   ? Cancer - Other Father   ?     liver  ? Alcohol abuse Brother   ? ALS Brother   ? Dementia Maternal Grandmother   ? Stroke Maternal Grandfather   ? Breast cancer Paternal Aunt   ?     unsure of age  ? Breast cancer Paternal Aunt   ?     unsure of age  ? ? ?Social History  ? ?Socioeconomic History  ? Marital status: Widowed  ?  Spouse name: Not on file  ? Number of children: Not on file  ? Years of education: Not on file  ? Highest education level: Not on file  ?Occupational History  ? Not on file  ?Tobacco Use  ? Smoking status: Former  ?  Packs/day: 1.00  ?  Years: 48.00  ?  Pack years: 48.00  ?  Types: Cigarettes  ?  Quit date: 10/31/2014  ?  Years since quitting: 7.2  ? Smokeless tobacco: Never  ? Tobacco comments:  ?  havent smoked in three weeks  ?Vaping Use  ? Vaping Use: Never used  ?Substance and Sexual Activity  ? Alcohol use: No  ? Drug use: No  ? Sexual activity: Never  ?  Comment: last cig 10/31/14  ?Other Topics Concern  ? Not on file  ?Social History Narrative  ? Not on file  ? ?Social Determinants of Health  ? ?Financial Resource Strain: Not on file  ?Food Insecurity: Not on file  ?Transportation Needs: Not on file  ?Physical Activity: Not on file  ?Stress: Not on file  ?Social Connections: Not on file  ?Intimate Partner Violence: Not on file  ? ? ?Outpatient Medications Prior to Visit  ?Medication Sig Dispense Refill  ? Ascorbic Acid (VITAMIN C) 500 MG CHEW     ? calcium carbonate (OSCAL) 1500 (600 Ca) MG TABS tablet Take by mouth.    ?  Cholecalciferol (VITAMIN D3) 1000 units CAPS Take 5 capsules (5,000 Units total) by mouth daily. 60 capsule   ? levothyroxine (SYNTHROID) 100 MCG tablet TAKE 1 TABLET (100 MCG TOTAL) BY MOUTH DAILY BEFORE BREAKFAST. 90 tablet 0  ? Menaquinone-7 (VITAMIN K2 PO) Take 90 mg by mouth daily.    ? Ubiquinol 100 MG CAPS Take 1 capsule by mouth daily.    ? simvastatin (ZOCOR) 20 MG tablet TAKE 1 TABLET (20 MG TOTAL) BY MOUTH AT BEDTIME. 90 tablet 1  ? ?No facility-administered medications prior to visit.  ? ? ?Allergies  ?Allergen Reactions  ? Alendronate Other (See Comments)  ?  Bone pain  ? Prozac [Fluoxetine] Other (See Comments)  ?  Headaches and dizziness  ? Zetia [Ezetimibe] Other (See Comments)  ?  Feet burning, bilat leg pains, Has, recurrent sinus problems  ? ? ?ROS ?Review of Systems ?Review of Systems:  ?A fourteen system review of systems was performed and found to be positive as per HPI. ? ?  ?Objective:  ?  ?Physical Exam ?General:  Pleasant and cooperative, appropriate for stated age.  ?Neuro:  Alert and oriented,  extra-ocular muscles intact  ?HEENT:  Normocephalic, atraumatic, neck supple  ?Skin:  no gross rash, warm, pink. ?Cardiac:  RRR, S1 S2 ?Respiratory: CTA B/L w/o wheezing. ?Vascular:  Ext warm, no cyanosis apprec.; cap RF less 2 sec. ?Psych:  No HI/SI, judgement and insight good, Euthymic mood. Full Affect. ? ?BP 127/70   Pulse 70   Temp 98.1 ?F (36.7 ?C)   Ht _0  (1.702 m)   Wt 162 lb (73.5 kg)   SpO2 98%   BMI 25.37 kg/m?  ?Wt Readings from Last 3 Encounters:  ?02/02/22 162 lb (73.5 kg)  ?09/28/21 160 lb (72.6 kg)  ?03/28/21 163 lb 4.8 oz (74.1 kg)  ? ? ? ?There are no preventive care reminders to display for this patient. ? ?There are no preventive care reminders to display for this patient. ? ?Lab Results  ?Component Value Date  ? TSH 0.604 09/26/2021  ? ?Lab Results  ?Component Value Date  ? WBC 7.0 09/26/2021  ? HGB 14.2 09/26/2021  ? HCT 43.1 09/26/2021  ? MCV 93 09/26/2021  ? PLT 220  09/26/2021  ? ?Lab Results  ?Component Value Date  ? NA 140 09/26/2021  ? K 4.7 09/26/2021  ? CO2 25 09/26/2021  ? GLUCOSE 86 09/26/2021  ? BUN 16 09/26/2021  ? CREATININE 0.78 09/26/2021  ? BILITOT 0.5 09/26/2021  ? ALKPHOS 178 (H) 09/26/2021  ? AST 38 09/26/2021  ? ALT 27 09/26/2021  ?  PROT 7.0 09/26/2021  ? ALBUMIN 4.2 09/26/2021  ? CALCIUM 9.8 09/26/2021  ? EGFR 82 09/26/2021  ? ?Lab Results  ?Component Value Date  ? CHOL 166 09/26/2021  ? ?Lab Results  ?Component Value Date  ? HDL 43 09/26/2021  ? ?Lab Results  ?Component Value Date  ? LDLCALC 103 (H) 09/26/2021  ? ?Lab Results  ?Component Value Date  ? TRIG 110 09/26/2021  ? ?Lab Results  ?Component Value Date  ? CHOLHDL 3.9 09/26/2021  ? ?Lab Results  ?Component Value Date  ? HGBA1C 5.1 09/26/2021  ? ? ?  ?Assessment & Plan:  ? ?Problem List Items Addressed This Visit   ? ?  ? Respiratory  ? COPD (chronic obstructive pulmonary disease) ? (Chronic)  ?  -Pt has 110 pck yr hx and has deferred low dose chest CT for lung cancer screening in the future. Pulse Ox normal at 98%. Asx. ?  ?  ?  ? Endocrine  ? Hypothyroidism - Primary (Chronic)  ?  -Last TSH wnl's at 0.604. ?-Continue current medication regimen. ?-Rechecking thyroid labs today. Pending results will make medication adjustments if indicated. ? ?  ?  ? Relevant Orders  ? TSH  ? T4, free  ?  ? Other  ? Hyperlipidemia (Chronic)  ?  -Last lipid panel: HDL 43, LDL 103 (ijmproved from 122). ?-Continue Simvastatin 20 mg 4 times per week and 10 mg (0.5 tablet 20 mg) 3 times per. Will repeat lipid panel and hepatic function. Pending lab results will make treatment adjustments if indicated.  ?-Continue heart healthy diet and exercise regimen. ?  ?  ? Relevant Medications  ? simvastatin (ZOCOR) 20 MG tablet  ? Other Relevant Orders  ? Lipid panel  ? Comprehensive metabolic panel  ? CBC with Differential/Platelet  ? Vitamin D insufficiency (Chronic)  ?  -Last Vit D 68.9, repeating Vit D today. Patient on Vitamin D  supplement 5000 units daily. Pending lab results will make treatment adjustments if indicated. ?  ?  ? Relevant Orders  ? Vitamin D (25 hydroxy)  ? ? ?Meds ordered this encounter  ?Medications  ? simvastatin (ZOCOR) 2

## 2022-02-02 ENCOUNTER — Encounter: Payer: Self-pay | Admitting: Physician Assistant

## 2022-02-02 ENCOUNTER — Ambulatory Visit (INDEPENDENT_AMBULATORY_CARE_PROVIDER_SITE_OTHER): Payer: Medicare Other | Admitting: Physician Assistant

## 2022-02-02 ENCOUNTER — Other Ambulatory Visit: Payer: Self-pay

## 2022-02-02 VITALS — BP 127/70 | HR 70 | Temp 98.1°F | Ht 67.0 in | Wt 162.0 lb

## 2022-02-02 DIAGNOSIS — E559 Vitamin D deficiency, unspecified: Secondary | ICD-10-CM | POA: Diagnosis not present

## 2022-02-02 DIAGNOSIS — E785 Hyperlipidemia, unspecified: Secondary | ICD-10-CM | POA: Diagnosis not present

## 2022-02-02 DIAGNOSIS — E039 Hypothyroidism, unspecified: Secondary | ICD-10-CM | POA: Diagnosis not present

## 2022-02-02 DIAGNOSIS — J449 Chronic obstructive pulmonary disease, unspecified: Secondary | ICD-10-CM | POA: Diagnosis not present

## 2022-02-02 MED ORDER — SIMVASTATIN 20 MG PO TABS: ORAL_TABLET | ORAL | 1 refills | Status: DC

## 2022-02-02 NOTE — Assessment & Plan Note (Signed)
-  Last Vit D 68.9, repeating Vit D today. Patient on Vitamin D supplement 5000 units daily. Pending lab results will make treatment adjustments if indicated. ?

## 2022-02-02 NOTE — Patient Instructions (Signed)
Dyslipidemia ?Dyslipidemia is an imbalance of waxy, fat-like substances (lipids) in the blood. The body needs lipids in small amounts. Dyslipidemia often involves a high level of cholesterol or triglycerides, which are types of lipids. ?Common forms of dyslipidemia include: ?High levels of LDL cholesterol. LDL is the type of cholesterol that causes fatty deposits (plaques) to build up in the blood vessels that carry blood away from the heart (arteries). ?Low levels of HDL cholesterol. HDL cholesterol is the type of cholesterol that protects against heart disease. High levels of HDL remove the LDL buildup from arteries. ?High levels of triglycerides. Triglycerides are a fatty substance in the blood that is linked to a buildup of plaques in the arteries. ?What are the causes? ?There are two main types of dyslipidemia: primary and secondary. Primary dyslipidemia is caused by changes (mutations) in genes that are passed down through families (inherited). These mutations cause several types of dyslipidemia. ?Secondary dyslipidemia may be caused by various risk factors that can lead to the disease, such as lifestyle choices and certain medical conditions. ?What increases the risk? ?You are more likely to develop this condition if you are an older man or if you are a woman who has gone through menopause. Other risk factors include: ?Having a family history of dyslipidemia. ?Taking certain medicines, including birth control pills, steroids, some diuretics, and beta-blockers. ?Eating a diet high in saturated fat. ?Smoking cigarettes or excessive alcohol intake. ?Having certain medical conditions such as diabetes, polycystic ovary syndrome (PCOS), kidney disease, liver disease, or hypothyroidism. ?Not exercising regularly. ?Being overweight or obese with too much belly fat. ?What are the signs or symptoms? ?In most cases, dyslipidemia does not usually cause any symptoms. ?In severe cases, very high lipid levels can  cause: ?Fatty bumps under the skin (xanthomas). ?A white or gray ring around the black center (pupil) of the eye. ?Very high triglyceride levels can cause inflammation of the pancreas (pancreatitis). ?How is this diagnosed? ?Your health care provider may diagnose dyslipidemia based on a routine blood test (fasting blood test). Because most people do not have symptoms of the condition, this blood testing (lipid profile) is done on adults age 88 and older and is repeated every 4-6 years. This test checks: ?Total cholesterol. This measures the total amount of cholesterol in your blood, including LDL cholesterol, HDL cholesterol, and triglycerides. A healthy number is below 200 mg/dL (5.17 mmol/L). ?LDL cholesterol. The target number for LDL cholesterol is different for each person, depending on individual risk factors. A healthy number is usually below 100 mg/dL (2.59 mmol/L). Ask your health care provider what your LDL cholesterol should be. ?HDL cholesterol. An HDL level of 60 mg/dL (1.55 mmol/L) or higher is best because it helps to protect against heart disease. A number below 40 mg/dL (1.03 mmol/L) for men or below 50 mg/dL (1.29 mmol/L) for women increases the risk for heart disease. ?Triglycerides. A healthy triglyceride number is below 150 mg/dL (1.69 mmol/L). ?If your lipid profile is abnormal, your health care provider may do other blood tests. ?How is this treated? ?Treatment depends on the type of dyslipidemia that you have and your other risk factors for heart disease and stroke. Your health care provider will have a target range for your lipid levels based on this information. ?Treatment for dyslipidemia starts with lifestyle changes, such as diet and exercise. Your health care provider may recommend that you: ?Get regular exercise. ?Make changes to your diet. ?Quit smoking if you smoke. ?Limit your alcohol intake. ?If diet  changes and exercise do not help you reach your goals, your health care provider  may also prescribe medicine to lower lipids. The most commonly prescribed type of medicine lowers your LDL cholesterol (statin drug). If you have a high triglyceride level, your provider may prescribe another type of drug (fibrate) or an omega-3 fish oil supplement, or both. ?Follow these instructions at home: ?Eating and drinking ? ?Follow instructions from your health care provider or dietitian about eating or drinking restrictions. ?Eat a healthy diet as told by your health care provider. This can help you reach and maintain a healthy weight, lower your LDL cholesterol, and raise your HDL cholesterol. This may include: ?Limiting your calories, if you are overweight. ?Eating more fruits, vegetables, whole grains, fish, and lean meats. ?Limiting saturated fat, trans fat, and cholesterol. ?Do not drink alcohol if: ?Your health care provider tells you not to drink. ?You are pregnant, may be pregnant, or are planning to become pregnant. ?If you drink alcohol: ?Limit how much you have to: ?0-1 drink a day for women. ?0-2 drinks a day for men. ?Know how much alcohol is in your drink. In the [place] one drink equals one 12 oz bottle of beer (355 mL), one 5 oz glass of wine (148 mL), or one 1? oz glass of hard liquor (44 mL). ?Activity ?Get regular exercise. Start an exercise and strength training program as told by your health care provider. Ask your health care provider what activities are safe for you. Your health care provider may recommend: ?30 minutes of aerobic activity 4-6 days a week. Brisk walking is an example of aerobic activity. ?Strength training 2 days a week. ?General instructions ?Do not use any products that contain nicotine or tobacco. These products include cigarettes, chewing tobacco, and vaping devices, such as e-cigarettes. If you need help quitting, ask your health care provider. ?Take over-the-counter and prescription medicines only as told by your health care provider. This includes  supplements. ?Keep all follow-up visits. This is important. ?Contact a health care provider if: ?You are having trouble sticking to your exercise or diet plan. ?You are struggling to quit smoking or to control your use of alcohol. ?Summary ?Dyslipidemia often involves a high level of cholesterol or triglycerides, which are types of lipids. ?Treatment depends on the type of dyslipidemia that you have and your other risk factors for heart disease and stroke. ?Treatment for dyslipidemia starts with lifestyle changes, such as diet and exercise. ?Your health care provider may prescribe medicine to lower lipids. ?This information is not intended to replace advice given to you by your health care provider. Make sure you discuss any questions you have with your health care provider. ?Document Revised: 01/10/2021 Document Reviewed: 01/10/2021 ?Elsevier Patient Education ? [place]. ? ?

## 2022-02-02 NOTE — Assessment & Plan Note (Signed)
-  Pt has 109 pck yr hx and has deferred low dose chest CT for lung cancer screening in the future. Pulse Ox normal at 98%. Asx. ?

## 2022-02-02 NOTE — Assessment & Plan Note (Signed)
-  Last TSH wnl's at 0.604. ?-Continue current medication regimen. ?-Rechecking thyroid labs today. Pending results will make medication adjustments if indicated. ? ?

## 2022-02-02 NOTE — Assessment & Plan Note (Addendum)
-  Last lipid panel: HDL 43, LDL 103 (ijmproved from 122). ?-Continue Simvastatin 20 mg 4 times per week and 10 mg (0.5 tablet 20 mg) 3 times per. Will repeat lipid panel and hepatic function. Pending lab results will make treatment adjustments if indicated.  ?-Continue heart healthy diet and exercise regimen. ?

## 2022-02-03 LAB — CBC WITH DIFFERENTIAL/PLATELET
Basophils Absolute: 0.1 10*3/uL (ref 0.0–0.2)
Basos: 1 %
EOS (ABSOLUTE): 0.2 10*3/uL (ref 0.0–0.4)
Eos: 2 %
Hematocrit: 43.6 % (ref 34.0–46.6)
Hemoglobin: 14.2 g/dL (ref 11.1–15.9)
Immature Grans (Abs): 0 10*3/uL (ref 0.0–0.1)
Immature Granulocytes: 0 %
Lymphocytes Absolute: 2 10*3/uL (ref 0.7–3.1)
Lymphs: 29 %
MCH: 30 pg (ref 26.6–33.0)
MCHC: 32.6 g/dL (ref 31.5–35.7)
MCV: 92 fL (ref 79–97)
Monocytes Absolute: 0.8 10*3/uL (ref 0.1–0.9)
Monocytes: 11 %
Neutrophils Absolute: 4.1 10*3/uL (ref 1.4–7.0)
Neutrophils: 57 %
Platelets: 220 10*3/uL (ref 150–450)
RBC: 4.73 x10E6/uL (ref 3.77–5.28)
RDW: 13.2 % (ref 11.7–15.4)
WBC: 7.1 10*3/uL (ref 3.4–10.8)

## 2022-02-03 LAB — COMPREHENSIVE METABOLIC PANEL
ALT: 32 IU/L (ref 0–32)
AST: 39 IU/L (ref 0–40)
Albumin/Globulin Ratio: 1.8 (ref 1.2–2.2)
Albumin: 4.6 g/dL (ref 3.8–4.8)
Alkaline Phosphatase: 172 IU/L — ABNORMAL HIGH (ref 44–121)
BUN/Creatinine Ratio: 16 (ref 12–28)
BUN: 14 mg/dL (ref 8–27)
Bilirubin Total: 0.6 mg/dL (ref 0.0–1.2)
CO2: 23 mmol/L (ref 20–29)
Calcium: 9.7 mg/dL (ref 8.7–10.3)
Chloride: 102 mmol/L (ref 96–106)
Creatinine, Ser: 0.89 mg/dL (ref 0.57–1.00)
Globulin, Total: 2.5 g/dL (ref 1.5–4.5)
Glucose: 93 mg/dL (ref 70–99)
Potassium: 4.6 mmol/L (ref 3.5–5.2)
Sodium: 140 mmol/L (ref 134–144)
Total Protein: 7.1 g/dL (ref 6.0–8.5)
eGFR: 70 mL/min/{1.73_m2} (ref >59)

## 2022-02-03 LAB — LIPID PANEL
Chol/HDL Ratio: 4.2 ratio (ref 0.0–4.4)
Cholesterol, Total: 182 mg/dL (ref 100–199)
HDL: 43 mg/dL (ref >39)
LDL Chol Calc (NIH): 112 mg/dL — ABNORMAL HIGH (ref 0–99)
Triglycerides: 151 mg/dL — ABNORMAL HIGH (ref 0–149)
VLDL Cholesterol Cal: 27 mg/dL (ref 5–40)

## 2022-02-03 LAB — VITAMIN D 25 HYDROXY (VIT D DEFICIENCY, FRACTURES): Vit D, 25-Hydroxy: 60.6 ng/mL (ref 30.0–100.0)

## 2022-02-03 LAB — T4, FREE: Free T4: 1.56 ng/dL (ref 0.82–1.77)

## 2022-02-03 LAB — TSH: TSH: 1.29 u[IU]/mL (ref 0.450–4.500)

## 2022-02-06 NOTE — Progress Notes (Signed)
?  ?Cardiology Office Note ? ? ?Date:  02/07/2022  ? ?ID:  Lori Montoya, DOB Jan 15, 1951, MRN [id number] ? ?PCP:  Lorrene Reid, PA-C  ? ? ?Chief Complaint  ?Patient presents with  ? Follow-up  ? ?Hyperlipidemia/PACs ? ?Wt Readings from Last 3 Encounters:  ?02/07/22 164 lb (74.4 kg)  ?02/02/22 162 lb (73.5 kg)  ?09/28/21 160 lb (72.6 kg)  ?  ? ?  ?History of Present Illness: ?Lori Montoya is a 71 y.o. female  who had palpitations in 2015 and a negative w/u with Dr. Mare Ferrari. ?  ?Hyperlipidemia has been since 2015.  She has had issues with high liver enzymes which prevented use of statins. She thinks she was on lipitor in the past, but this made her liver enzymes worse.  She has tried Zetia as well, which did not bring down her lipids as much.  She felt some headache with Zetia.  ? ? ? ?Past Medical History:  ?Diagnosis Date  ? Elevated alkaline phosphatase level   ? GERD (gastroesophageal reflux disease)   ? Hyperlipidemia   ? Osteoporosis   ? Other and unspecified disc disorder of lumbar region   ? Personal history of other disorders of nervous system and sense organs   ? Seizures ([place])   ? last seizure in 1977  ? Tobacco use disorder   ? Unspecified hypothyroidism   ? ? ?Past Surgical History:  ?Procedure Laterality Date  ? BREAST EXCISIONAL BIOPSY Right   ? benign  ? BREAST LUMPECTOMY WITH RADIOACTIVE SEED LOCALIZATION Right 02/28/2018  ? Procedure: RIGHT BREAST LUMPECTOMY WITH RADIOACTIVE SEED LOCALIZATION ERAS PATHWAY;  Surgeon: Erroll Luna, MD;  Location: [place] [facility];  Service: General;  Laterality: Right;  ? BREAST REDUCTION SURGERY Bilateral 02/28/2018  ? Procedure: BILATERA BREAST REDUCTION FOR SYMMERTY;  Surgeon: Wallace Going, DO;  Location: [place];  Service: Plastics;  Laterality: Bilateral;  ? CHOLECYSTECTOMY  2011  ? lap choli  ? COLONOSCOPY    ? EYE SURGERY Bilateral   ? cataract extraction  ? MICROLARYNGOSCOPY Left 01/01/2015  ? Procedure: MICROLARYNGOSCOPY  WITH EXCISION OF LEFT VOCAL CORD NODULE ;  Surgeon: Jerrell Belfast, MD;  Location: [place];  Service: ENT;  Laterality: Left;  ? REDUCTION MAMMAPLASTY Left   ? ? ? ?Current Outpatient Medications  ?Medication Sig Dispense Refill  ? Ascorbic Acid (VITAMIN C) 500 MG CHEW Chew 1 tablet by mouth daily.    ? calcium carbonate (OSCAL) 1500 (600 Ca) MG TABS tablet Take 1,500 mg by mouth daily with breakfast.    ? Cholecalciferol (VITAMIN D3) 1000 units CAPS Take 5 capsules (5,000 Units total) by mouth daily. 60 capsule   ? levothyroxine (SYNTHROID) 100 MCG tablet TAKE 1 TABLET (100 MCG TOTAL) BY MOUTH DAILY BEFORE BREAKFAST. 90 tablet 0  ? Menaquinone-7 (VITAMIN K2 PO) Take 90 mg by mouth daily.    ? simvastatin (ZOCOR) 20 MG tablet Take 1 tablet by mouth on Monday, Wednesday, Friday and Sunday. Take 0.5 tablet by mouth on Tuesday, Thursday and Saturday. 90 tablet 1  ? Ubiquinol 100 MG CAPS Take 1 capsule by mouth daily.    ? ?No current facility-administered medications for this visit.  ? ? ?Allergies:   Zetia [ezetimibe], Alendronate, and Prozac [fluoxetine]  ? ? ?Social History:  The patient  reports that she quit smoking about 7 years ago. Her smoking use included cigarettes. She has a 48.00 pack-year smoking history. She has never used smokeless  tobacco. She reports that she does not drink alcohol and does not use drugs.  ? ?Family History:  The patient's family history includes ALS in her brother; Alcohol abuse in her brother and mother; Breast cancer in her paternal aunt and paternal aunt; CVA in her mother; Cancer - Other in her father; Dementia in her maternal grandmother and mother; Hyperlipidemia in her mother; Hypertension in her mother; Hypothyroidism in her mother; Stroke in her maternal grandfather.  ? ? ?ROS:  Please see the history of present illness.   Otherwise, review of systems are positive for palpitations.   All other systems are reviewed and negative.  ? ? ?PHYSICAL EXAM: ?VS:  BP  140/80   Pulse 81   Ht '5\' 7"'$  (1.702 m)   Wt 164 lb (74.4 kg)   SpO2 97%   BMI 25.69 kg/m?  , BMI Body mass index is 25.69 kg/m?. ?GEN: Well nourished, well developed, in no acute distress ?HEENT: normal ?Neck: no JVD, carotid bruits, or masses ?Cardiac: RRR, premature beats; no murmurs, rubs, or gallops,no edema  ?Respiratory:  clear to auscultation bilaterally, normal work of breathing ?GI: soft, nontender, nondistended, + BS ?MS: no deformity or atrophy ?Skin: warm and dry, no rash ?Neuro:  Strength and sensation are intact ?Psych: euthymic mood, full affect ? ? ?EKG:   ?The ekg ordered today demonstrates normal sinus rhythm with PACs ? ? ?Recent Labs: ?02/02/2022: ALT 32; BUN 14; Creatinine, Ser 0.89; Hemoglobin 14.2; Platelets 220; Potassium 4.6; Sodium 140; TSH 1.290  ? ?Lipid Panel ?   ?Component Value Date/Time  ? CHOL 182 02/02/2022 1111  ? TRIG 151 (H) 02/02/2022 1111  ? HDL 43 02/02/2022 1111  ? CHOLHDL 4.2 02/02/2022 1111  ? LDLCALC 112 (H) 02/02/2022 1111  ? ?  ?Other studies Reviewed: ?Additional studies/ records that were reviewed today with results demonstrating: LDL 112 in March 2023. ? ? ?ASSESSMENT AND PLAN: ? ?Hyperlipidemia: She has had to see the lipid clinic at times to adjust medications.  She has had issues tolerating statins.  She wanted to decrease her lipid lowering drugs in frequency.  Due to muscle pains, will switch simvastatin to rosuvastatin 10 mg daily.  We will check liver and lipid tests in 2 to 3 months. ?Palpitations: PACs on ECG. Mild sx, that do not bother her too much.  We discussed beta-blocker but given how mild her symptoms are, the side effects from the beta-blocker may be worse than the PACs themselves. ?DOE: stable.  Increasing exercise to at least 150 minutes a week is good for preventing diabetes and improving stamina. ?Hypothyroid: TSH was normal in March 2023. ? ? ? ?Current medicines are reviewed at length with the patient today.  The patient concerns regarding  her medicines were addressed. ? ?The following changes have been made:  No change ? ?Labs/ tests ordered today include: lipids and liver tests ?No orders of the defined types were placed in this encounter. ? ? ?Recommend 150 minutes/week of aerobic exercise ?Low fat, low carb, high fiber diet recommended ? ?Disposition:   FU in 1 year ? ? ?Signed, ?Larae Grooms, MD  ?02/07/2022 3:49 PM    ?[facility] ?[address], [place], [place]  [address] ?Phone: [phone]; Fax: [phone]  ? ?

## 2022-02-07 ENCOUNTER — Encounter: Payer: Self-pay | Admitting: Interventional Cardiology

## 2022-02-07 ENCOUNTER — Ambulatory Visit: Payer: Medicare Other | Admitting: Interventional Cardiology

## 2022-02-07 ENCOUNTER — Other Ambulatory Visit: Payer: Self-pay

## 2022-02-07 VITALS — BP 140/80 | HR 81 | Ht 67.0 in | Wt 164.0 lb

## 2022-02-07 DIAGNOSIS — R002 Palpitations: Secondary | ICD-10-CM | POA: Diagnosis not present

## 2022-02-07 DIAGNOSIS — E039 Hypothyroidism, unspecified: Secondary | ICD-10-CM

## 2022-02-07 DIAGNOSIS — E782 Mixed hyperlipidemia: Secondary | ICD-10-CM

## 2022-02-07 DIAGNOSIS — R0609 Other forms of dyspnea: Secondary | ICD-10-CM

## 2022-02-07 MED ORDER — ROSUVASTATIN CALCIUM 10 MG PO TABS: 10.0000 mg | ORAL_TABLET | Freq: Every day | ORAL | 3 refills | Status: DC

## 2022-02-07 NOTE — Patient Instructions (Signed)
Medication Instructions:  ?Your physician has recommended you make the following change in your medication: Stop Simvastatin. ?Start Rosuvastatin 10 mg by mouth daily ? ?*If you need a refill on your cardiac medications before your next appointment, please call your pharmacy* ? ? ?Lab Work: ?Your physician recommends that you return for lab work on Apr 14, 2022.  Lipid and liver profiles.  This will be fasting.  The lab opens at 7:15 AM ? ?If you have labs (blood work) drawn today and your tests are completely normal, you will receive your results only by: ?MyChart Message (if you have MyChart) OR ?A paper copy in the mail ?If you have any lab test that is abnormal or we need to change your treatment, we will call you to review the results. ? ? ?Testing/Procedures: ?none ? ? ?Follow-Up: ?At [facility], you and your health needs are our priority.  As part of our continuing mission to provide you with exceptional heart care, we have created designated Provider Care Teams.  These Care Teams include your primary Cardiologist (physician) and Advanced Practice Providers (APPs -  Physician Assistants and Nurse Practitioners) who all work together to provide you with the care you need, when you need it. ? ?We recommend signing up for the patient portal called "MyChart".  Sign up information is provided on this After Visit Summary.  MyChart is used to connect with patients for Virtual Visits (Telemedicine).  Patients are able to view lab/test results, encounter notes, upcoming appointments, etc.  Non-urgent messages can be sent to your provider as well.   ?To learn more about what you can do with MyChart, go to [email or web address].   ? ?Your next appointment:   ?12 month(s) ? ?The format for your next appointment:   ?In Person ? ?Provider:   ?Larae Grooms, MD   ? ? ?Other Instructions ? ? ?

## 2022-02-10 ENCOUNTER — Other Ambulatory Visit: Payer: Self-pay | Admitting: Physician Assistant

## 2022-02-10 DIAGNOSIS — Z1231 Encounter for screening mammogram for malignant neoplasm of breast: Secondary | ICD-10-CM

## 2022-02-17 ENCOUNTER — Ambulatory Visit
Admission: RE | Admit: 2022-02-17 | Discharge: 2022-02-17 | Disposition: A | Discharge status: Home / Self Care | Payer: Medicare Other | Source: Ambulatory Visit | Attending: Physician Assistant | Admitting: Physician Assistant

## 2022-02-17 DIAGNOSIS — Z1231 Encounter for screening mammogram for malignant neoplasm of breast: Secondary | ICD-10-CM

## 2022-04-14 ENCOUNTER — Other Ambulatory Visit: Payer: Medicare Other | Admitting: *Deleted

## 2022-04-14 DIAGNOSIS — E782 Mixed hyperlipidemia: Secondary | ICD-10-CM

## 2022-04-14 LAB — HEPATIC FUNCTION PANEL
ALT: 25 IU/L (ref 0–32)
AST: 32 IU/L (ref 0–40)
Albumin: 4.4 g/dL (ref 3.7–4.7)
Alkaline Phosphatase: 162 IU/L — ABNORMAL HIGH (ref 44–121)
Bilirubin Total: 0.4 mg/dL (ref 0.0–1.2)
Bilirubin, Direct: 0.13 mg/dL (ref 0.00–0.40)
Total Protein: 7.1 g/dL (ref 6.0–8.5)

## 2022-04-14 LAB — LIPID PANEL
Chol/HDL Ratio: 3.5 ratio (ref 0.0–4.4)
Cholesterol, Total: 153 mg/dL (ref 100–199)
HDL: 44 mg/dL (ref >39)
LDL Chol Calc (NIH): 91 mg/dL (ref 0–99)
Triglycerides: 97 mg/dL (ref 0–149)
VLDL Cholesterol Cal: 18 mg/dL (ref 5–40)

## 2022-04-19 ENCOUNTER — Other Ambulatory Visit: Payer: Self-pay | Admitting: Physician Assistant

## 2022-07-31 ENCOUNTER — Other Ambulatory Visit: Payer: Self-pay | Admitting: Physician Assistant

## 2022-08-08 ENCOUNTER — Ambulatory Visit (INDEPENDENT_AMBULATORY_CARE_PROVIDER_SITE_OTHER): Payer: Medicare Other | Admitting: Physician Assistant

## 2022-08-08 ENCOUNTER — Ambulatory Visit: Payer: Medicare Other | Admitting: Physician Assistant

## 2022-08-08 ENCOUNTER — Encounter: Payer: Self-pay | Admitting: Physician Assistant

## 2022-08-08 VITALS — BP 133/85 | HR 68 | Ht 67.0 in | Wt 163.0 lb

## 2022-08-08 DIAGNOSIS — E785 Hyperlipidemia, unspecified: Secondary | ICD-10-CM

## 2022-08-08 DIAGNOSIS — Z1321 Encounter for screening for nutritional disorder: Secondary | ICD-10-CM

## 2022-08-08 DIAGNOSIS — Z13228 Encounter for screening for other metabolic disorders: Secondary | ICD-10-CM

## 2022-08-08 DIAGNOSIS — E559 Vitamin D deficiency, unspecified: Secondary | ICD-10-CM | POA: Diagnosis not present

## 2022-08-08 DIAGNOSIS — Z1329 Encounter for screening for other suspected endocrine disorder: Secondary | ICD-10-CM

## 2022-08-08 DIAGNOSIS — Z130 Encounter for screening for diseases of the blood and blood-forming organs and certain disorders involving the immune mechanism: Secondary | ICD-10-CM

## 2022-08-08 DIAGNOSIS — E039 Hypothyroidism, unspecified: Secondary | ICD-10-CM | POA: Diagnosis not present

## 2022-08-08 NOTE — Progress Notes (Signed)
Established patient visit   Patient: Lori Montoya   DOB: 1951/08/27   71 y.o. Female  MRN: [id number] Visit Date: 08/08/2022  Chief Complaint  Patient presents with   Follow-up    Patient presents for follow up. She would like her flu shot while in the office. She would like labs as she is fasting today    Subjective    HPI HPI     Follow-up    Additional comments: Patient presents for follow up. She would like her flu shot while in the office. She would like labs as she is fasting today       Last edited by Pennelope Bracken, CMA on 08/08/2022 10:13 AM.      Patient presents for chronic follow-up visit. Patient has no acute concerns.  Hypothyroidism: Reports medication compliance. No fatigue. Does report an episode of fluttering sensation/palpitations, pt has hx of PVCs and has been evaluated by cardiology.  HLD: Pt taking medication as directed and reports myalgias have been tolerable. Continues with a low fat diet and continues with daily walks.   Medications: Outpatient Medications Prior to Visit  Medication Sig   Ascorbic Acid (VITAMIN C) 500 MG CHEW Chew 1 tablet by mouth daily.   calcium carbonate (OSCAL) 1500 (600 Ca) MG TABS tablet Take 1,500 mg by mouth daily with breakfast.   Cholecalciferol (VITAMIN D3) 1000 units CAPS Take 5 capsules (5,000 Units total) by mouth daily.   levothyroxine (SYNTHROID) 100 MCG tablet TAKE 1 TABLET BY MOUTH DAILY BEFORE BREAKFAST.   Menaquinone-7 (VITAMIN K2 PO) Take 90 mg by mouth daily.   rosuvastatin (CRESTOR) 10 MG tablet Take 1 tablet (10 mg total) by mouth daily.   Ubiquinol 100 MG CAPS Take 1 capsule by mouth daily.   No facility-administered medications prior to visit.    Review of Systems Review of Systems:  A fourteen system review of systems was performed and found to be positive as per HPI.  Last CBC Lab Results  Component Value Date   WBC 7.1 02/02/2022   HGB 14.2 02/02/2022   HCT 43.6 02/02/2022   MCV 92  02/02/2022   MCH 30.0 02/02/2022   RDW 13.2 02/02/2022   PLT 220 56/31/4970   Last metabolic panel Lab Results  Component Value Date   GLUCOSE 93 02/02/2022   NA 140 02/02/2022   K 4.6 02/02/2022   CL 102 02/02/2022   CO2 23 02/02/2022   BUN 14 02/02/2022   CREATININE 0.89 02/02/2022   EGFR 70 02/02/2022   CALCIUM 9.7 02/02/2022   PROT 7.1 04/14/2022   ALBUMIN 4.4 04/14/2022   LABGLOB 2.5 02/02/2022   AGRATIO 1.8 02/02/2022   BILITOT 0.4 04/14/2022   ALKPHOS 162 (H) 04/14/2022   AST 32 04/14/2022   ALT 25 04/14/2022   Last lipids Lab Results  Component Value Date   CHOL 153 04/14/2022   HDL 44 04/14/2022   LDLCALC 91 04/14/2022   TRIG 97 04/14/2022   CHOLHDL 3.5 04/14/2022   Last hemoglobin A1c Lab Results  Component Value Date   HGBA1C 5.1 09/26/2021   Last thyroid functions Lab Results  Component Value Date   TSH 1.290 02/02/2022   T3TOTAL 98 10/20/2019   Last vitamin D Lab Results  Component Value Date   VD25OH 60.6 02/02/2022     Objective    BP 133/85   Pulse 68   Ht _0  (1.702 m)   Wt 163 lb (73.9 kg)   SpO2 97%  BMI 25.53 kg/m  BP Readings from Last 3 Encounters:  08/08/22 133/85  02/07/22 140/80  02/02/22 127/70   Wt Readings from Last 3 Encounters:  08/08/22 163 lb (73.9 kg)  02/07/22 164 lb (74.4 kg)  02/02/22 162 lb (73.5 kg)    Physical Exam  General:  Well Developed, well nourished, appropriate for stated age.  Neuro:  Alert and oriented,  extra-ocular muscles intact  HEENT:  Normocephalic, atraumatic, neck supple  Skin:  no gross rash, warm, pink. Cardiac:  RRR, S1 S2 Respiratory: CTA B/L  Vascular:  Ext warm, no cyanosis apprec.; cap RF less 2 sec. Psych:  No HI/SI, judgement and insight good, Euthymic mood. Full Affect.   No results found for any visits on 08/08/22.  Assessment & Plan      Problem List Items Addressed This Visit       Endocrine   Hypothyroidism - Primary (Chronic)    -Last TSH  wnl -Continue current medication regimen. -Rechecking thyroid labs today. Pending results will make medication adjustments if indicated.       Relevant Orders   TSH   T4, free   T3     Other   Hyperlipidemia (Chronic)    -Last lipid panel: HDL 44, LDL 91 -Will repeat fasting lipid panel and hepatic function today. Recommend to continue rosuvastatin 10 mg daily if myalgias tolerable. Continue with a heart healthy diet and stay as active as possible.      Relevant Orders   CBC w/Diff   Comp Met (CMET)   Lipid Profile   HgB A1c   Vitamin D insufficiency (Chronic)    -Will collect Vitamin D today.      Relevant Orders   Vitamin D (25 hydroxy)   Other Visit Diagnoses     Screening for endocrine, nutritional, metabolic and immunity disorder       Relevant Orders   CBC w/Diff   Comp Met (CMET)   Lipid Profile   HgB A1c       Return for MCW.        Lorrene Reid, PA-C  [facility] [phone] (phone) [phone] (fax)  [place]

## 2022-08-08 NOTE — Assessment & Plan Note (Signed)
-  Last TSH wnl -Continue current medication regimen. -Rechecking thyroid labs today. Pending results will make medication adjustments if indicated.

## 2022-08-08 NOTE — Assessment & Plan Note (Signed)
-  Last lipid panel: HDL 44, LDL 91 -Will repeat fasting lipid panel and hepatic function today. Recommend to continue rosuvastatin 10 mg daily if myalgias tolerable. Continue with a heart healthy diet and stay as active as possible.

## 2022-08-08 NOTE — Assessment & Plan Note (Signed)
-  Will collect Vitamin D today.

## 2022-08-09 ENCOUNTER — Other Ambulatory Visit: Payer: Self-pay | Admitting: Physician Assistant

## 2022-08-09 DIAGNOSIS — E039 Hypothyroidism, unspecified: Secondary | ICD-10-CM

## 2022-08-09 DIAGNOSIS — E785 Hyperlipidemia, unspecified: Secondary | ICD-10-CM

## 2022-08-09 LAB — LIPID PANEL
Chol/HDL Ratio: 4 ratio (ref 0.0–4.4)
Cholesterol, Total: 175 mg/dL (ref 100–199)
HDL: 44 mg/dL (ref >39)
LDL Chol Calc (NIH): 108 mg/dL — ABNORMAL HIGH (ref 0–99)
Triglycerides: 131 mg/dL (ref 0–149)
VLDL Cholesterol Cal: 23 mg/dL (ref 5–40)

## 2022-08-09 LAB — CBC WITH DIFFERENTIAL/PLATELET
Basophils Absolute: 0 10*3/uL (ref 0.0–0.2)
Basos: 1 %
EOS (ABSOLUTE): 0.2 10*3/uL (ref 0.0–0.4)
Eos: 4 %
Hematocrit: 42.4 % (ref 34.0–46.6)
Hemoglobin: 14.3 g/dL (ref 11.1–15.9)
Immature Grans (Abs): 0 10*3/uL (ref 0.0–0.1)
Immature Granulocytes: 1 %
Lymphocytes Absolute: 1.5 10*3/uL (ref 0.7–3.1)
Lymphs: 25 %
MCH: 31.1 pg (ref 26.6–33.0)
MCHC: 33.7 g/dL (ref 31.5–35.7)
MCV: 92 fL (ref 79–97)
Monocytes Absolute: 0.7 10*3/uL (ref 0.1–0.9)
Monocytes: 12 %
Neutrophils Absolute: 3.5 10*3/uL (ref 1.4–7.0)
Neutrophils: 57 %
Platelets: 216 10*3/uL (ref 150–450)
RBC: 4.6 x10E6/uL (ref 3.77–5.28)
RDW: 12.7 % (ref 11.7–15.4)
WBC: 6 10*3/uL (ref 3.4–10.8)

## 2022-08-09 LAB — COMPREHENSIVE METABOLIC PANEL
ALT: 41 IU/L — ABNORMAL HIGH (ref 0–32)
AST: 45 IU/L — ABNORMAL HIGH (ref 0–40)
Albumin/Globulin Ratio: 1.5 (ref 1.2–2.2)
Albumin: 4.4 g/dL (ref 3.8–4.8)
Alkaline Phosphatase: 202 IU/L — ABNORMAL HIGH (ref 44–121)
BUN/Creatinine Ratio: 15 (ref 12–28)
BUN: 11 mg/dL (ref 8–27)
Bilirubin Total: 0.5 mg/dL (ref 0.0–1.2)
CO2: 25 mmol/L (ref 20–29)
Calcium: 9.9 mg/dL (ref 8.7–10.3)
Chloride: 101 mmol/L (ref 96–106)
Creatinine, Ser: 0.73 mg/dL (ref 0.57–1.00)
Globulin, Total: 2.9 g/dL (ref 1.5–4.5)
Glucose: 85 mg/dL (ref 70–99)
Potassium: 4.7 mmol/L (ref 3.5–5.2)
Sodium: 141 mmol/L (ref 134–144)
Total Protein: 7.3 g/dL (ref 6.0–8.5)
eGFR: 88 mL/min/{1.73_m2} (ref >59)

## 2022-08-09 LAB — T3: T3, Total: 115 ng/dL (ref 71–180)

## 2022-08-09 LAB — TSH: TSH: 0.408 u[IU]/mL — ABNORMAL LOW (ref 0.450–4.500)

## 2022-08-09 LAB — VITAMIN D 25 HYDROXY (VIT D DEFICIENCY, FRACTURES): Vit D, 25-Hydroxy: 60.1 ng/mL (ref 30.0–100.0)

## 2022-08-09 LAB — HEMOGLOBIN A1C
Est. average glucose Bld gHb Est-mCnc: 108 mg/dL
Hgb A1c MFr Bld: 5.4 % (ref 4.8–5.6)

## 2022-08-09 LAB — T4, FREE: Free T4: 1.88 ng/dL — ABNORMAL HIGH (ref 0.82–1.77)

## 2022-08-09 MED ORDER — LEVOTHYROXINE SODIUM 88 MCG PO TABS: 88.0000 ug | ORAL_TABLET | Freq: Every day | ORAL | 1 refills | Status: DC

## 2022-09-06 ENCOUNTER — Ambulatory Visit (INDEPENDENT_AMBULATORY_CARE_PROVIDER_SITE_OTHER): Payer: Medicare Other | Admitting: Physician Assistant

## 2022-09-06 VITALS — BP 131/80 | HR 77 | Temp 98.0°F | Wt 165.0 lb

## 2022-09-06 DIAGNOSIS — Z23 Encounter for immunization: Secondary | ICD-10-CM

## 2022-09-06 NOTE — Progress Notes (Signed)
Patient in clinic for high dose flu injection. Patient tolerated injection well.

## 2022-09-19 ENCOUNTER — Other Ambulatory Visit: Payer: Medicare Other

## 2022-09-19 DIAGNOSIS — E039 Hypothyroidism, unspecified: Secondary | ICD-10-CM

## 2022-09-19 DIAGNOSIS — E785 Hyperlipidemia, unspecified: Secondary | ICD-10-CM

## 2022-09-20 ENCOUNTER — Other Ambulatory Visit: Payer: Self-pay | Admitting: Physician Assistant

## 2022-09-20 DIAGNOSIS — E039 Hypothyroidism, unspecified: Secondary | ICD-10-CM

## 2022-09-20 LAB — COMPREHENSIVE METABOLIC PANEL
ALT: 25 IU/L (ref 0–32)
AST: 38 IU/L (ref 0–40)
Albumin/Globulin Ratio: 1.7 (ref 1.2–2.2)
Albumin: 4.3 g/dL (ref 3.8–4.8)
Alkaline Phosphatase: 171 IU/L — ABNORMAL HIGH (ref 44–121)
BUN/Creatinine Ratio: 17 (ref 12–28)
BUN: 14 mg/dL (ref 8–27)
Bilirubin Total: 0.6 mg/dL (ref 0.0–1.2)
CO2: 23 mmol/L (ref 20–29)
Calcium: 9.5 mg/dL (ref 8.7–10.3)
Chloride: 102 mmol/L (ref 96–106)
Creatinine, Ser: 0.83 mg/dL (ref 0.57–1.00)
Globulin, Total: 2.6 g/dL (ref 1.5–4.5)
Glucose: 78 mg/dL (ref 70–99)
Potassium: 4.5 mmol/L (ref 3.5–5.2)
Sodium: 139 mmol/L (ref 134–144)
Total Protein: 6.9 g/dL (ref 6.0–8.5)
eGFR: 75 mL/min/{1.73_m2} (ref >59)

## 2022-09-20 LAB — LIPID PANEL
Chol/HDL Ratio: 4.3 ratio (ref 0.0–4.4)
Cholesterol, Total: 187 mg/dL (ref 100–199)
HDL: 43 mg/dL (ref >39)
LDL Chol Calc (NIH): 122 mg/dL — ABNORMAL HIGH (ref 0–99)
Triglycerides: 122 mg/dL (ref 0–149)
VLDL Cholesterol Cal: 22 mg/dL (ref 5–40)

## 2022-09-20 LAB — TSH: TSH: 1.88 u[IU]/mL (ref 0.450–4.500)

## 2022-09-20 LAB — T4, FREE: Free T4: 1.45 ng/dL (ref 0.82–1.77)

## 2022-09-20 MED ORDER — LEVOTHYROXINE SODIUM 88 MCG PO TABS: 88.0000 ug | ORAL_TABLET | Freq: Every day | ORAL | 1 refills | Status: DC

## 2022-11-29 ENCOUNTER — Telehealth: Payer: Self-pay | Admitting: *Deleted

## 2022-11-29 ENCOUNTER — Encounter: Payer: Medicare Other | Admitting: Nurse Practitioner

## 2022-11-29 NOTE — Telephone Encounter (Signed)
Pt calling stating she needs to cancel her appointment today due to testing positive for covid. She said she started feeling sick on 11/25/22 and wanted to know if she needs to have medicine sent in or suggestions on what she should do. Please advise. She will call back to reschedule. Angeli Demilio Zimmerman Rumple, CMA

## 2022-11-29 NOTE — Telephone Encounter (Signed)
She is right at the end of the 5 day period where the anti viral medication is helpful. If she is feeling ok, she should follow the instructions you gave her and rest and treat symptoms.  Thanks  -HB

## 2022-11-29 NOTE — Telephone Encounter (Signed)
Called pt she is advised of what to do during and after her covid period pt stated  she had no fever or discomfort

## 2023-01-15 ENCOUNTER — Ambulatory Visit (INDEPENDENT_AMBULATORY_CARE_PROVIDER_SITE_OTHER): Payer: Medicare Other | Admitting: Nurse Practitioner

## 2023-01-15 ENCOUNTER — Encounter: Payer: Self-pay | Admitting: Nurse Practitioner

## 2023-01-15 VITALS — BP 139/79 | HR 68 | Ht 67.0 in | Wt 167.1 lb

## 2023-01-15 DIAGNOSIS — Z1321 Encounter for screening for nutritional disorder: Secondary | ICD-10-CM

## 2023-01-15 DIAGNOSIS — E039 Hypothyroidism, unspecified: Secondary | ICD-10-CM

## 2023-01-15 DIAGNOSIS — Z1329 Encounter for screening for other suspected endocrine disorder: Secondary | ICD-10-CM | POA: Diagnosis not present

## 2023-01-15 DIAGNOSIS — E785 Hyperlipidemia, unspecified: Secondary | ICD-10-CM

## 2023-01-15 DIAGNOSIS — Z13228 Encounter for screening for other metabolic disorders: Secondary | ICD-10-CM | POA: Insufficient documentation

## 2023-01-15 DIAGNOSIS — Z0000 Encounter for general adult medical examination without abnormal findings: Secondary | ICD-10-CM | POA: Diagnosis not present

## 2023-01-15 DIAGNOSIS — Z130 Encounter for screening for diseases of the blood and blood-forming organs and certain disorders involving the immune mechanism: Secondary | ICD-10-CM | POA: Insufficient documentation

## 2023-01-15 NOTE — Progress Notes (Signed)
Subjective:   Lori Montoya is a 72 y.o. female who presents for Medicare Annual (Subsequent) preventive examination. -hypothyroid -hyperlipidemia  -labs done 08/2022  --mild elevation of LDL and otherwise normal lipid panel  --mild elevation of lipid panel  --low TSH at 0.408 with mildly elevated free T4. Normal T3.  -no current concerns or complaints.  --denies chest pain, chest pressure, or shortness of breath. He denies headaches or visual disturbances. He denies abdominal pain, nausea, vomiting, or changes in bowel or bladder habits.    Review of Systems    See HPI         Objective:    Today's Vitals   01/15/23 1332 01/15/23 1340  BP: (Abnormal) 140/81 139/79  Pulse: 68   SpO2: 96%   Weight: 167 lb 1.6 oz (75.8 kg)   Height: '5\' 7"'$  (1.702 m)    Body mass index is 26.17 kg/m.    Row Labels 01/15/2023    1:36 PM 02/28/2018    7:22 AM 02/19/2018    4:11 PM 05/16/2017    1:12 PM 12/30/2014   12:07 PM  Advanced Directives   Section Header. No data exists in this row.       Does Patient Have a Medical Advance Directive?   No No No No No  Would patient like information on creating a medical advance directive?   No - Patient declined No - Patient declined  No - Patient declined No - patient declined information    Current Medications (verified) Outpatient Encounter Medications as of 01/15/2023  Medication Sig   Ascorbic Acid (VITAMIN C) 500 MG CHEW Chew 1 tablet by mouth daily.   calcium carbonate (OSCAL) 1500 (600 Ca) MG TABS tablet Take 1,500 mg by mouth daily with breakfast.   Cholecalciferol (VITAMIN D3) 1000 units CAPS Take 5 capsules (5,000 Units total) by mouth daily.   levothyroxine (SYNTHROID) 88 MCG tablet Take 1 tablet (88 mcg total) by mouth daily.   Menaquinone-7 (VITAMIN K2 PO) Take 90 mg by mouth daily.   rosuvastatin (CRESTOR) 10 MG tablet Take 1 tablet (10 mg total) by mouth daily.   Ubiquinol 100 MG CAPS Take 1 capsule by mouth daily.   No  facility-administered encounter medications on file as of 01/15/2023.    Allergies (verified) Zetia [ezetimibe], Alendronate, and Prozac [fluoxetine]   History: Past Medical History:  Diagnosis Date   Elevated alkaline phosphatase level    GERD (gastroesophageal reflux disease)    Hyperlipidemia    Osteoporosis    Other and unspecified disc disorder of lumbar region    Personal history of other disorders of nervous system and sense organs    Seizures ([place])    last seizure in 1977   Tobacco use disorder    Unspecified hypothyroidism    Past Surgical History:  Procedure Laterality Date   BREAST EXCISIONAL BIOPSY Right    benign   BREAST LUMPECTOMY WITH RADIOACTIVE SEED LOCALIZATION Right 02/28/2018   Procedure: RIGHT BREAST LUMPECTOMY WITH RADIOACTIVE SEED LOCALIZATION ERAS PATHWAY;  Surgeon: Erroll Luna, MD;  Location: [place];  Service: General;  Laterality: Right;   BREAST REDUCTION SURGERY Bilateral 02/28/2018   Procedure: BILATERA BREAST REDUCTION FOR SYMMERTY;  Surgeon: Wallace Going, DO;  Location: [place];  Service: Plastics;  Laterality: Bilateral;   CHOLECYSTECTOMY  2011   lap choli   COLONOSCOPY     EYE SURGERY Bilateral    cataract extraction   MICROLARYNGOSCOPY Left 01/01/2015  Procedure: MICROLARYNGOSCOPY WITH EXCISION OF LEFT VOCAL CORD NODULE ;  Surgeon: Jerrell Belfast, MD;  Location: [place];  Service: ENT;  Laterality: Left;   REDUCTION MAMMAPLASTY Left    Family History  Problem Relation Age of Onset   Hyperlipidemia Mother    Hypertension Mother    CVA Mother    Alcohol abuse Mother    Dementia Mother    Hypothyroidism Mother    Cancer - Other Father        liver   Alcohol abuse Brother    ALS Brother    Dementia Maternal Grandmother    Stroke Maternal Grandfather    Breast cancer Paternal Aunt        unsure of age   Breast cancer Paternal 7        unsure of age   Social History    Socioeconomic History   Marital status: Widowed    Spouse name: Not on file   Number of children: Not on file   Years of education: Not on file   Highest education level: Not on file  Occupational History   Not on file  Tobacco Use   Smoking status: Former    Packs/day: 1.00    Years: 48.00    Total pack years: 48.00    Types: Cigarettes    Quit date: 10/31/2014    Years since quitting: 8.2   Smokeless tobacco: Never   Tobacco comments:    havent smoked in three weeks  Vaping Use   Vaping Use: Never used  Substance and Sexual Activity   Alcohol use: No   Drug use: No   Sexual activity: Never    Comment: last cig 10/31/14  Other Topics Concern   Not on file  Social History Narrative   Not on file   Social Determinants of Health   Financial Resource Strain: Low Risk  (01/15/2023)   Overall Financial Resource Strain (CARDIA)    Difficulty of Paying Living Expenses: Not hard at all  Food Insecurity: No Food Insecurity (01/15/2023)   Hunger Vital Sign    Worried About Running Out of Food in the Last Year: Never true    [place] in the Last Year: Never true  Transportation Needs: No Transportation Needs (01/15/2023)   PRAPARE - Hydrologist (Medical): No    Lack of Transportation (Non-Medical): No  Physical Activity: Unknown (01/15/2023)   Exercise Vital Sign    Days of Exercise per Week: Not on file    Minutes of Exercise per Session: 30 min  Stress: Stress Concern Present (01/15/2023)   [place]    Feeling of Stress : To some extent  Social Connections: Socially Isolated (01/15/2023)   Social Connection and Isolation Panel [NHANES]    Frequency of Communication with Friends and Family: More than three times a week    Frequency of Social Gatherings with Friends and Family: More than three times a week    Attends Religious Services: Never    Marine scientist or  Organizations: No    Attends Archivist Meetings: Never    Marital Status: Widowed    Tobacco Counseling Counseling given: Not Answered Tobacco comments: havent smoked in three weeks   Clinical Intake:  Pre-visit preparation completed: Yes  Pain : No/denies pain     Nutritional Risks: None Diabetes: No  How often do you need to have someone help  you when you read instructions, pamphlets, or other written materials from your doctor or pharmacy?: 1 - Never  Diabetic?NO  Interpreter Needed?: No      Activities of Daily Living   Row Labels 01/15/2023    1:53 PM 01/11/2023    9:04 AM  In your present state of health, do you have any difficulty performing the following activities:   Section Header. No data exists in this row.    Hearing?   0 0  Vision?   0 0  Difficulty concentrating or making decisions?   0 0  Walking or climbing stairs?   0 0  Dressing or bathing?   0 0  Doing errands, shopping?   0 0  Preparing Food and eating ?    N  Using the Toilet?    N  In the past six months, have you accidently leaked urine?    Y  Do you have problems with loss of bowel control?    N  Managing your Medications?    N  Managing your Finances?    N  Housekeeping or managing your Housekeeping?    N    Patient Care Team: Lorrene Reid, PA-C (Inactive) as PCP - General (Physician Assistant) Jettie Booze, MD as PCP - Cardiology (Cardiology) Teena Irani, MD (Inactive) as Consulting Physician (Gastroenterology) Jerrell Belfast, MD as Consulting Physician (Otolaryngology) Darlin Coco, MD (Cardiology) Dillingham, Loel Lofty, DO as Attending Physician (Plastic Surgery) Mcarthur Rossetti, MD as Consulting Physician (Orthopedic Surgery)  Indicate any recent Medical Services you may have received from other than [facility] providers in the past year (date may be approximate).     Assessment:  1. Encounter for Medicare annual wellness exam Annual medicare  wellness visit   2. Hypothyroidism, unspecified type Recheck thyroid panel and adjust levothyroxine dose as indicated  - TSH + free T4  3. Hyperlipidemia, unspecified hyperlipidemia type Recheck fasting lipid panel and adjust dosing crestor as indicated.  - Hemoglobin A1c; Future - Lipid panel; Future - CBC with Differential/Platelet; Future - Comp Met (CMET); Future  4. Screening for endocrine, nutritional, metabolic and immunity disorder Check HgbA1c  - Hemoglobin A1c; Future - Hearing/Vision screen No results found.   Depression Screen   Row Labels 01/15/2023    2:00 PM 08/08/2022   10:14 AM 02/02/2022   11:29 AM 09/28/2021    1:56 PM 03/28/2021    1:24 PM 09/28/2020    9:31 AM 08/23/2020    3:24 PM  PHQ 2/9 Scores   Section Header. No data exists in this row.         PHQ - 2 Score   0 0 0 0 0 0   PHQ- 9 Score    '2 1 1 1 3   '$ Exception Documentation         Patient refusal    Fall Risk   Row Labels 01/11/2023    9:04 AM 11/25/2022   10:38 AM 08/08/2022   10:13 AM 02/02/2022   11:29 AM 09/28/2021    1:55 PM  Fall Risk    Section Header. No data exists in this row.       Falls in the past year?   0 0 1 0 0  Number falls in past yr:   0 0 0 0 0  Injury with Fall?   0 0 0 0 0  Risk for fall due to :     No Fall Risks No Fall Risks No Fall Risks  Follow up     Education provided;Falls evaluation completed Falls evaluation completed Falls evaluation completed    FALL RISK PREVENTION PERTAINING TO THE HOME:  Any stairs in or around the home? Yes  If so, are there any without handrails? No  Home free of loose throw rugs in walkways, pet beds, electrical cords, etc? Yes  Adequate lighting in your home to reduce risk of falls? Yes   ASSISTIVE DEVICES UTILIZED TO PREVENT FALLS:  Life alert? No  Use of a cane, walker or w/c? No  Grab bars in the bathroom? Yes  Shower chair or bench in shower? No  Elevated toilet seat or a handicapped toilet? Yes   TIMED UP AND GO:  Was  the test performed? Yes .  Length of time to ambulate 10 feet: 10 sec.   Gait steady and fast without use of assistive device  Cognitive Function:       Row Labels 01/15/2023    1:30 PM 09/28/2021    1:41 PM 09/28/2020    9:34 AM 10/20/2019    1:36 PM 05/16/2017    1:13 PM  6CIT Screen   Section Header. No data exists in this row.       What Year?   0 points 0 points 0 points 0 points 0 points  What month?   0 points 0 points 0 points 0 points 0 points  What time?   0 points 0 points 0 points 0 points 0 points  Count back from 20   0 points 0 points 0 points 0 points 0 points  Months in reverse   0 points 0 points 0 points 0 points 0 points  Repeat phrase   0 points 0 points 0 points 0 points 0 points  Total Score   0 points 0 points 0 points 0 points 0 points    Immunizations Immunization History  Administered Date(s) Administered   Fluad Quad(high Dose 65+) 07/30/2019, 09/28/2021, 09/06/2022   Influenza, High Dose Seasonal PF 09/12/2016, 08/27/2017, 08/27/2018   Influenza,inj,Quad PF,6+ Mos 08/12/2020   Moderna Covid-19 Vaccine Bivalent Booster 46yr & up 09/25/2022   PFIZER(Purple Top)SARS-COV-2 Vaccination 12/15/2019, 01/05/2020, 08/27/2020, 04/15/2021   Pfizer Covid-19 Vaccine Bivalent Booster 170yr& up 08/04/2021   Pneumococcal Conjugate-13 07/31/2016   Pneumococcal Polysaccharide-23 08/14/2009, 09/17/2017   Tdap 06/04/2013   Zoster Recombinat (Shingrix) 01/14/2018, 05/16/2018   Zoster, Live 12/07/2010    TDAP status: Up to date  Flu Vaccine status: Up to date  Pneumococcal vaccine status: Up to date  Covid-19 vaccine status: Completed vaccines  Qualifies for Shingles Vaccine? Yes   Zostavax completed Yes   Shingrix Completed?: Yes  Screening Tests Health Maintenance  Topic Date Due   Lung Cancer Screening  Never done   COVID-19 Vaccine (7 - 2023-24 season) 11/20/2022   DTaP/Tdap/Td (2 - Td or Tdap) 06/05/2023   Medicare Annual Wellness (AWV)  01/16/2024    MAMMOGRAM  02/18/2024   COLONOSCOPY (Pts [id number] coverage will need to be confirmed)  06/01/2024   Pneumonia Vaccine 65+62ears old  Completed   INFLUENZA VACCINE  Completed   DEXA SCAN  Completed   Hepatitis C Screening  Completed   Zoster Vaccines- Shingrix  Completed   HPV VACCINES  Aged Out    Health Maintenance  Health Maintenance Due  Topic Date Due   Lung Cancer Screening  Never done   COVID-19 Vaccine (7 - 2023-24 season) 11/20/2022    Colorectal cancer screening: Type of screening: Colonoscopy.  Completed 2015. Repeat every 10 years  Mammogram status: Completed 2023. Repeat every year  Bone Density status: Completed 2021. Results reflect: Bone density results: OSTEOPOROSIS. Repeat every 2 years.  Lung Cancer Screening: (Low Dose CT Chest recommended if Age 81-80 years, 30 pack-year currently smoking OR have quit w/in 15years.) does not qualify.   Lung Cancer Screening Referral: n/a  Additional Screening:  Hepatitis C Screening: does not qualify; Completed 2017  Vision Screening: Recommended annual ophthalmology exams for early detection of glaucoma and other disorders of the eye. Is the patient up to date with their annual eye exam?  Yes  Who is the provider or what is the name of the office in which the patient attends annual eye exams? [facility] If pt is not established with a provider, would they like to be referred to a provider to establish care? No .   Dental Screening: Recommended annual dental exams for proper oral hygiene  Community Resource Referral / Chronic Care Management: CRR required this visit?  No   CCM required this visit?  No      Plan:     I have personally reviewed and noted the following in the patient's chart:   Medical and social history Use of alcohol, tobacco or illicit drugs  Current medications and supplements including opioid prescriptions. Patient is not currently taking opioid prescriptions. Functional  ability and status Nutritional status Physical activity Advanced directives List of other physicians Hospitalizations, surgeries, and ER visits in previous 12 months Vitals Screenings to include cognitive, depression, and falls Referrals and appointments  In addition, I have reviewed and discussed with patient certain preventive protocols, quality metrics, and best practice recommendations. A written personalized care plan for preventive services as well as general preventive health recommendations were provided to patient.   List any persons and their role that are participating in this visit with patient:  Patient goal is to keep living   Ronnell Freshwater, NP   01/15/2023   Nurse Notes: 40 min face to face

## 2023-01-16 ENCOUNTER — Other Ambulatory Visit: Payer: Medicare Other

## 2023-01-16 ENCOUNTER — Other Ambulatory Visit: Payer: Self-pay

## 2023-01-16 DIAGNOSIS — Z1321 Encounter for screening for nutritional disorder: Secondary | ICD-10-CM | POA: Diagnosis not present

## 2023-01-16 DIAGNOSIS — E039 Hypothyroidism, unspecified: Secondary | ICD-10-CM | POA: Diagnosis not present

## 2023-01-16 DIAGNOSIS — Z130 Encounter for screening for diseases of the blood and blood-forming organs and certain disorders involving the immune mechanism: Secondary | ICD-10-CM

## 2023-01-16 DIAGNOSIS — E785 Hyperlipidemia, unspecified: Secondary | ICD-10-CM | POA: Diagnosis not present

## 2023-01-16 DIAGNOSIS — Z13228 Encounter for screening for other metabolic disorders: Secondary | ICD-10-CM | POA: Diagnosis not present

## 2023-01-16 DIAGNOSIS — Z1329 Encounter for screening for other suspected endocrine disorder: Secondary | ICD-10-CM | POA: Diagnosis not present

## 2023-01-17 LAB — COMPREHENSIVE METABOLIC PANEL
ALT: 24 IU/L (ref 0–32)
AST: 38 IU/L (ref 0–40)
Albumin/Globulin Ratio: 1.7 (ref 1.2–2.2)
Albumin: 4.6 g/dL (ref 3.8–4.8)
Alkaline Phosphatase: 162 IU/L — ABNORMAL HIGH (ref 44–121)
BUN/Creatinine Ratio: 16 (ref 12–28)
BUN: 13 mg/dL (ref 8–27)
Bilirubin Total: 0.5 mg/dL (ref 0.0–1.2)
CO2: 23 mmol/L (ref 20–29)
Calcium: 9.8 mg/dL (ref 8.7–10.3)
Chloride: 102 mmol/L (ref 96–106)
Creatinine, Ser: 0.81 mg/dL (ref 0.57–1.00)
Globulin, Total: 2.7 g/dL (ref 1.5–4.5)
Glucose: 86 mg/dL (ref 70–99)
Potassium: 5.4 mmol/L — ABNORMAL HIGH (ref 3.5–5.2)
Sodium: 141 mmol/L (ref 134–144)
Total Protein: 7.3 g/dL (ref 6.0–8.5)
eGFR: 78 mL/min/{1.73_m2} (ref >59)

## 2023-01-17 LAB — TSH+FREE T4
Free T4: 1.38 ng/dL (ref 0.82–1.77)
TSH: 2.81 u[IU]/mL (ref 0.450–4.500)

## 2023-01-17 LAB — CBC WITH DIFFERENTIAL/PLATELET
Basophils Absolute: 0.1 10*3/uL (ref 0.0–0.2)
Basos: 1 %
EOS (ABSOLUTE): 0.2 10*3/uL (ref 0.0–0.4)
Eos: 3 %
Hematocrit: 44.7 % (ref 34.0–46.6)
Hemoglobin: 14.8 g/dL (ref 11.1–15.9)
Immature Grans (Abs): 0 10*3/uL (ref 0.0–0.1)
Immature Granulocytes: 0 %
Lymphocytes Absolute: 1.4 10*3/uL (ref 0.7–3.1)
Lymphs: 23 %
MCH: 31.2 pg (ref 26.6–33.0)
MCHC: 33.1 g/dL (ref 31.5–35.7)
MCV: 94 fL (ref 79–97)
Monocytes Absolute: 0.6 10*3/uL (ref 0.1–0.9)
Monocytes: 10 %
Neutrophils Absolute: 3.8 10*3/uL (ref 1.4–7.0)
Neutrophils: 63 %
Platelets: 199 10*3/uL (ref 150–450)
RBC: 4.75 x10E6/uL (ref 3.77–5.28)
RDW: 13.1 % (ref 11.7–15.4)
WBC: 6.1 10*3/uL (ref 3.4–10.8)

## 2023-01-17 LAB — HEMOGLOBIN A1C
Est. average glucose Bld gHb Est-mCnc: 108 mg/dL
Hgb A1c MFr Bld: 5.4 % (ref 4.8–5.6)

## 2023-01-17 LAB — LIPID PANEL
Chol/HDL Ratio: 3.8 ratio (ref 0.0–4.4)
Cholesterol, Total: 188 mg/dL (ref 100–199)
HDL: 49 mg/dL (ref >39)
LDL Chol Calc (NIH): 120 mg/dL — ABNORMAL HIGH (ref 0–99)
Triglycerides: 105 mg/dL (ref 0–149)
VLDL Cholesterol Cal: 19 mg/dL (ref 5–40)

## 2023-02-08 ENCOUNTER — Other Ambulatory Visit: Payer: Self-pay | Admitting: Interventional Cardiology

## 2023-03-04 NOTE — Progress Notes (Unsigned)
Cardiology Office Note   Date:  03/05/2023   ID:  Lori Montoya, DOB [birth year], MRN [id number]  PCP:  Melida Quitter, PA    No chief complaint on file.  Hyperlipidemia/PACs  Wt Readings from Last 3 Encounters:  03/05/23 167 lb 3.2 oz (75.8 kg)  01/15/23 167 lb 1.6 oz (75.8 kg)  09/06/22 165 lb (74.8 kg)       History of Present Illness: Lori Montoya is a 72 y.o. female  who had palpitations in 2015 and a negative w/u with Dr. Patty Sermons.   Hyperlipidemia has been known since at least 2015.  She has had issues with high liver enzymes which prevented use of statins. She thinks she was on lipitor in the past, but this made her liver enzymes worse.  She has tried Zetia as well, which did not bring down her lipids as much.  She felt some headache with Zetia.   She has had to see the lipid clinic at times to adjust medications.  She has had issues tolerating statins.  She wanted to decrease her lipid lowering drugs in frequency.    In 2023, simvastatin was switched to rosuvastatin 10 mg daily.  Rosuvastatin was decreased to 5 mg daily and Synthroid was decreased due to palpitations  She reports that her Cartia mobile detected atrial fib at times. Denies : Chest pain. Dizziness. Leg edema. Nitroglycerin use. Orthopnea.  Paroxysmal nocturnal dyspnea. Syncope.  .    Past Medical History:  Diagnosis Date   Elevated alkaline phosphatase level    GERD (gastroesophageal reflux disease)    Hyperlipidemia    Osteoporosis    Other and unspecified disc disorder of lumbar region    Personal history of other disorders of nervous system and sense organs    Seizures    last seizure in 1977   Tobacco use disorder    Unspecified hypothyroidism     Past Surgical History:  Procedure Laterality Date   BREAST EXCISIONAL BIOPSY Right    benign   BREAST LUMPECTOMY WITH RADIOACTIVE SEED LOCALIZATION Right 02/28/2018   Procedure: RIGHT BREAST LUMPECTOMY WITH RADIOACTIVE SEED  LOCALIZATION ERAS PATHWAY;  Surgeon: Harriette Bouillon, MD;  Location: [facility];  Service: General;  Laterality: Right;   BREAST REDUCTION SURGERY Bilateral 02/28/2018   Procedure: BILATERA BREAST REDUCTION FOR SYMMERTY;  Surgeon: Peggye Form, DO;  Location: [facility];  Service: Plastics;  Laterality: Bilateral;   CHOLECYSTECTOMY  2011   lap choli   COLONOSCOPY     EYE SURGERY Bilateral    cataract extraction   MICROLARYNGOSCOPY Left 01/01/2015   Procedure: MICROLARYNGOSCOPY WITH EXCISION OF LEFT VOCAL CORD NODULE ;  Surgeon: Osborn Coho, MD;  Location: [facility];  Service: ENT;  Laterality: Left;   REDUCTION MAMMAPLASTY Left      Current Outpatient Medications  Medication Sig Dispense Refill   Ascorbic Acid (VITAMIN C) 500 MG CHEW Chew 1 tablet by mouth daily.     calcium carbonate (OSCAL) 1500 (600 Ca) MG TABS tablet Take 1,500 mg by mouth daily with breakfast.     Cholecalciferol (VITAMIN D3) 1000 units CAPS Take 5 capsules (5,000 Units total) by mouth daily. 60 capsule    diphenhydrAMINE (BENADRYL) 25 mg capsule Take by mouth.     famotidine (PEPCID) 10 MG tablet Take 10 mg by mouth 2 (two) times daily.     levothyroxine (SYNTHROID) 88 MCG tablet Take 1 tablet (88 mcg total) by mouth  daily. 90 tablet 1   Menaquinone-7 40 MCG TABS Take 1 tablet by mouth daily.     rosuvastatin (CRESTOR) 10 MG tablet TAKE 1 TABLET (10 MG TOTAL) BY MOUTH DAILY. STOP SIMVASTATIN 90 tablet 0   Ubiquinol 100 MG CAPS Take 1 capsule by mouth daily.     No current facility-administered medications for this visit.    Allergies:   Zetia [ezetimibe], Alendronate, and Prozac [fluoxetine]    Social History:  The patient  reports that she quit smoking about 8 years ago. Her smoking use included cigarettes. She has a 48.00 pack-year smoking history. She has never used smokeless tobacco. She reports that she does not drink alcohol and does not use drugs.    Family History:  The patient's family history includes ALS in her brother; Alcohol abuse in her brother and mother; Breast cancer in her paternal aunt and paternal aunt; CVA in her mother; Cancer - Other in her father; Dementia in her maternal grandmother and mother; Hyperlipidemia in her mother; Hypertension in her mother; Hypothyroidism in her mother; Stroke in her maternal grandfather.    ROS:  Please see the history of present illness.   Otherwise, review of systems are positive for .   All other systems are reviewed and negative.    PHYSICAL EXAM: VS:  BP (!) 148/86   Pulse 81   Ht  (1.702 m)   Wt 167 lb 3.2 oz (75.8 kg)   SpO2 95%   BMI 26.19 kg/m  , BMI Body mass index is 26.19 kg/m. GEN: Well nourished, well developed, in no acute distress HEENT: normal Neck: no JVD, carotid bruits, or masses Cardiac: RRR; no murmurs, rubs, or gallops,no edema  Respiratory:  clear to auscultation bilaterally, normal work of breathing GI: soft, nontender, nondistended, + BS MS: no deformity or atrophy Skin: warm and dry, no rash Neuro:  Strength and sensation are intact Psych: euthymic mood, full affect   EKG:   The ekg ordered today demonstrates normal sinus rhythm, LVH   Recent Labs: 01/16/2023: ALT 24; BUN 13; Creatinine, Ser 0.81; Hemoglobin 14.8; Platelets 199; Potassium 5.4; Sodium 141; TSH 2.810   Lipid Panel    Component Value Date/Time   CHOL 188 01/16/2023 0957   TRIG 105 01/16/2023 0957   HDL 49 01/16/2023 0957   CHOLHDL 3.8 01/16/2023 0957   LDLCALC 120 (H) 01/16/2023 0957     Other studies Reviewed: Additional studies/ records that were reviewed today with results demonstrating: cartia mobile strips reviewed.  Atrial flutter noted   ASSESSMENT AND PLAN:  Hyperlipidemia: Whole food, plant-based diet.  High-fiber diet.  Avoid processed foods.  Plan for calcium scoring CT. LDL 120 in February 2024 HDL 49 triglycerides 105 total cholesterol 188; these labs  were with the patient on rosuvastatin 5 mg daily.  Since the palpitations were more likely related to too much Synthroid, we could consider going back up to 10 mg on the Crestor depending on the calcium score CT Palpitations/Paroxysmal atrial flutter: PACs noted on prior ECG.  Tracings on the Kartia mobile are suspicious for atrial flutter but the one episode was brief, about 2 hours, occurring 6 months ago.  If there are more episodes where the heart rate is in the 141-144 range, let us know because that is probably more atrial flutter and we would have to consider potentially blood thinners or consider an ablation.  Only documented episode was in October 2023.  No further episodes since that time.  Dyspnea  on exertion: former smoker.  Stable.  Hypothyroid: Normal TSH in February 2024   Current medicines are reviewed at length with the patient today.  The patient concerns regarding her medicines were addressed.  The following changes have been made:  No change  Labs/ tests ordered today include:  No orders of the defined types were placed in this encounter.   Recommend 150 minutes/week of aerobic exercise Low fat, low carb, high fiber diet recommended  Disposition:   FU in 6 months   Signed, Lance Muss, MD  03/05/2023 1:39 PM    [facility] [address] [place], [place], [place]  [address] Phone: [phone]; Fax: [phone]

## 2023-03-05 ENCOUNTER — Ambulatory Visit: Payer: Medicare Other | Attending: Interventional Cardiology | Admitting: Interventional Cardiology

## 2023-03-05 ENCOUNTER — Encounter: Payer: Self-pay | Admitting: Interventional Cardiology

## 2023-03-05 VITALS — BP 148/86 | HR 81 | Ht 67.0 in | Wt 167.2 lb

## 2023-03-05 DIAGNOSIS — R0609 Other forms of dyspnea: Secondary | ICD-10-CM | POA: Diagnosis not present

## 2023-03-05 DIAGNOSIS — R002 Palpitations: Secondary | ICD-10-CM | POA: Diagnosis not present

## 2023-03-05 DIAGNOSIS — E039 Hypothyroidism, unspecified: Secondary | ICD-10-CM | POA: Diagnosis not present

## 2023-03-05 DIAGNOSIS — E782 Mixed hyperlipidemia: Secondary | ICD-10-CM

## 2023-03-05 DIAGNOSIS — I4892 Unspecified atrial flutter: Secondary | ICD-10-CM

## 2023-03-05 NOTE — Patient Instructions (Signed)
Medication Instructions:  Your physician recommends that you continue on your current medications as directed. Please refer to the Current Medication list given to you today.  *If you need a refill on your cardiac medications before your next appointment, please call your pharmacy*   Lab Work: none If you have labs (blood work) drawn today and your tests are completely normal, you will receive your results only by: MyChart Message (if you have MyChart) OR A paper copy in the mail If you have any lab test that is abnormal or we need to change your treatment, we will call you to review the results.   Testing/Procedures: Dr Eldridge Dace recommends you have a Calcium Score CT Scan   Follow-Up: At [facility], you and your health needs are our priority.  As part of our continuing mission to provide you with exceptional heart care, we have created designated Provider Care Teams.  These Care Teams include your primary Cardiologist (physician) and Advanced Practice Providers (APPs -  Physician Assistants and Nurse Practitioners) who all work together to provide you with the care you need, when you need it.  We recommend signing up for the patient portal called "MyChart".  Sign up information is provided on this After Visit Summary.  MyChart is used to connect with patients for Virtual Visits (Telemedicine).  Patients are able to view lab/test results, encounter notes, upcoming appointments, etc.  Non-urgent messages can be sent to your provider as well.   To learn more about what you can do with MyChart, go to [email or web address].    Your next appointment:   6 month(s)  Provider:   Lance Muss, MD     Other Instructions Continue to monitor your heart rate with Kardia mobile.  Let us know if you have episodes of increased heart rate.

## 2023-04-04 ENCOUNTER — Ambulatory Visit (HOSPITAL_BASED_OUTPATIENT_CLINIC_OR_DEPARTMENT_OTHER)
Admission: RE | Admit: 2023-04-04 | Discharge: 2023-04-04 | Disposition: A | Discharge status: Home / Self Care | Payer: Medicare Other | Source: Ambulatory Visit | Attending: Interventional Cardiology | Admitting: Interventional Cardiology

## 2023-04-04 DIAGNOSIS — E782 Mixed hyperlipidemia: Secondary | ICD-10-CM | POA: Insufficient documentation

## 2023-04-04 DIAGNOSIS — R002 Palpitations: Secondary | ICD-10-CM | POA: Insufficient documentation

## 2023-04-04 DIAGNOSIS — R0609 Other forms of dyspnea: Secondary | ICD-10-CM | POA: Insufficient documentation

## 2023-04-09 ENCOUNTER — Telehealth: Payer: Self-pay | Admitting: *Deleted

## 2023-04-09 ENCOUNTER — Other Ambulatory Visit: Payer: Self-pay | Admitting: Nurse Practitioner

## 2023-04-09 DIAGNOSIS — E039 Hypothyroidism, unspecified: Secondary | ICD-10-CM

## 2023-04-09 NOTE — Telephone Encounter (Signed)
Per last office note Dr Eldridge Dace wanted to see patient back in 6 months.  I called patient to schedule this appointment.  Left message to call office

## 2023-04-10 NOTE — Telephone Encounter (Signed)
Appointment has been scheduled for August 29, 2023

## 2023-05-21 ENCOUNTER — Other Ambulatory Visit: Payer: Self-pay | Admitting: Interventional Cardiology

## 2023-05-21 NOTE — Progress Notes (Signed)
Labs are stable and improved.  Continue to monitor.

## 2023-06-05 DIAGNOSIS — L03115 Cellulitis of right lower limb: Secondary | ICD-10-CM | POA: Diagnosis not present

## 2023-06-26 DIAGNOSIS — K080 Exfoliation of teeth due to systemic causes: Secondary | ICD-10-CM | POA: Diagnosis not present

## 2023-07-03 DIAGNOSIS — K080 Exfoliation of teeth due to systemic causes: Secondary | ICD-10-CM | POA: Diagnosis not present

## 2023-07-16 ENCOUNTER — Ambulatory Visit (INDEPENDENT_AMBULATORY_CARE_PROVIDER_SITE_OTHER): Payer: Medicare Other | Admitting: Family Medicine

## 2023-07-16 ENCOUNTER — Encounter: Payer: Self-pay | Admitting: Family Medicine

## 2023-07-16 VITALS — BP 135/75 | HR 73 | Resp 18 | Ht 67.0 in | Wt 165.0 lb

## 2023-07-16 DIAGNOSIS — F17210 Nicotine dependence, cigarettes, uncomplicated: Secondary | ICD-10-CM

## 2023-07-16 DIAGNOSIS — E039 Hypothyroidism, unspecified: Secondary | ICD-10-CM

## 2023-07-16 DIAGNOSIS — E782 Mixed hyperlipidemia: Secondary | ICD-10-CM | POA: Diagnosis not present

## 2023-07-16 NOTE — Assessment & Plan Note (Signed)
Last lipid panel: LDL 120, HDL 49, triglycerides 105.  Repeating lipid panel and CMP for hepatic function.  Will adjust regimen as indicated by results in coordination with cardiology.

## 2023-07-16 NOTE — Assessment & Plan Note (Signed)
We discussed recommendations for lung cancer screening with low-dose lung CT.  Patient will think about having this done, she is hesitant to start as she is aware that it is recommended to be repeated annually once started.

## 2023-07-16 NOTE — Assessment & Plan Note (Signed)
Last TSH within normal limits, but given recent symptoms rechecking thyroid panel.  If within normal limits, she would like to return to the old [facility].  She will be moving her prescriptions to [facility] in [place] as well.

## 2023-07-16 NOTE — Patient Instructions (Addendum)
PREVENTATIVE CARE: -DUE for tetanus booster (Tdap - once every 10 years), you can get this at your pharmacy -you will be due for your flu shot once it is available this season, usually starting around September  Once the lab results are back, I will get in touch with you on MyChart and let you know what we will do moving forward!  You can think about the lung scan and let me know. I included some information for you, but let me know if you have any questions!

## 2023-07-16 NOTE — Progress Notes (Signed)
   Established Patient Office Visit  Subjective   Patient ID: Lori Montoya, female    DOB: 20-Oct-1951  Age: 72 y.o. MRN: [id number]  Chief Complaint  Patient presents with   Hyperlipidemia   Hypothyroidism    HPI Lori Montoya is a 72 y.o. female presenting today for follow up of hyperlipidemia, hypothyroidism. Hyperlipidemia: tolerating rosuvastatin well with no myalgias or significant side effects. Currently consuming a low fat diet.  She stays active outside doing yardwork. The 10-year ASCVD risk score (Arnett DK, et al., 2019) is: 12.9% Hypothyroidism: Taking levothyroxine 88 mcg regularly in the AM away from food and vitamins. Denies fatigue, weight changes, heat/cold intolerance, bowel changes, CVS symptoms.  Ever since the manufacture was changed from [facility] in May, she has noticed that her hair has been particularly brittle in spite of her best efforts with shampoo/conditioner and hair treatments.  ROS Negative unless otherwise noted in HPI   Objective:     BP 135/75   Pulse 73   Resp 18   Ht 5\' 7"  (1.702 m)   Wt 165 lb (74.8 kg)   SpO2 95%   BMI 25.84 kg/m   Physical Exam Constitutional:      General: She is not in acute distress.    Appearance: Normal appearance.  HENT:     Head: Normocephalic and atraumatic.  Cardiovascular:     Rate and Rhythm: Normal rate and regular rhythm.     Heart sounds: No murmur heard.    No friction rub. No gallop.  Pulmonary:     Effort: Pulmonary effort is normal. No respiratory distress.     Breath sounds: No wheezing, rhonchi or rales.  Skin:    General: Skin is warm and dry.  Neurological:     Mental Status: She is alert and oriented to person, place, and time.     Assessment & Plan:  Mixed hyperlipidemia Assessment & Plan: Last lipid panel: LDL 120, HDL 49, triglycerides 105.  Repeating lipid panel and CMP for hepatic function.  Will adjust regimen as indicated by results in coordination with cardiology.  Orders: -      CBC with Differential/Platelet; Future -     Comprehensive metabolic panel; Future -     Lipid panel; Future  Hypothyroidism, unspecified type Assessment & Plan: Last TSH within normal limits, but given recent symptoms rechecking thyroid panel.  If within normal limits, she would like to return to the old [facility].  [facility] [place] [facility]  Orders: -     TSH; Future -     T4, free; Future  Smoking greater than 30 pack years-  quit Dec 2015 Assessment & Plan: We discussed recommendations for lung cancer screening with low-dose lung CT.  Patient will think about having this done, she is hesitant to start as she is aware that it is recommended to be repeated annually once started.     Return in about 3 days (around 07/19/2023) for fasting blood work.  Once results are back, this will determine whether we need to follow-up in another 6 months or sooner depending on changes in medication.   Melida Quitter, PA

## 2023-07-19 ENCOUNTER — Other Ambulatory Visit: Payer: Medicare Other

## 2023-07-19 DIAGNOSIS — E039 Hypothyroidism, unspecified: Secondary | ICD-10-CM

## 2023-07-19 DIAGNOSIS — E782 Mixed hyperlipidemia: Secondary | ICD-10-CM | POA: Diagnosis not present

## 2023-07-20 LAB — LIPID PANEL
Chol/HDL Ratio: 4.3 ratio (ref 0.0–4.4)
Cholesterol, Total: 177 mg/dL (ref 100–199)
HDL: 41 mg/dL (ref >39)
LDL Chol Calc (NIH): 113 mg/dL — ABNORMAL HIGH (ref 0–99)
Triglycerides: 129 mg/dL (ref 0–149)
VLDL Cholesterol Cal: 23 mg/dL (ref 5–40)

## 2023-07-20 LAB — COMPREHENSIVE METABOLIC PANEL
ALT: 28 IU/L (ref 0–32)
AST: 35 IU/L (ref 0–40)
Albumin: 4.3 g/dL (ref 3.8–4.8)
Alkaline Phosphatase: 160 IU/L — ABNORMAL HIGH (ref 44–121)
BUN/Creatinine Ratio: 17 (ref 12–28)
BUN: 12 mg/dL (ref 8–27)
Bilirubin Total: 0.5 mg/dL (ref 0.0–1.2)
CO2: 24 mmol/L (ref 20–29)
Calcium: 9.7 mg/dL (ref 8.7–10.3)
Chloride: 105 mmol/L (ref 96–106)
Creatinine, Ser: 0.72 mg/dL (ref 0.57–1.00)
Globulin, Total: 2.8 g/dL (ref 1.5–4.5)
Glucose: 83 mg/dL (ref 70–99)
Potassium: 4.6 mmol/L (ref 3.5–5.2)
Sodium: 141 mmol/L (ref 134–144)
Total Protein: 7.1 g/dL (ref 6.0–8.5)
eGFR: 89 mL/min/{1.73_m2} (ref >59)

## 2023-07-20 LAB — CBC WITH DIFFERENTIAL/PLATELET
Basophils Absolute: 0 10*3/uL (ref 0.0–0.2)
Basos: 1 %
EOS (ABSOLUTE): 0.2 10*3/uL (ref 0.0–0.4)
Eos: 3 %
Hematocrit: 40.2 % (ref 34.0–46.6)
Hemoglobin: 13.3 g/dL (ref 11.1–15.9)
Immature Grans (Abs): 0 10*3/uL (ref 0.0–0.1)
Immature Granulocytes: 0 %
Lymphocytes Absolute: 1.5 10*3/uL (ref 0.7–3.1)
Lymphs: 23 %
MCH: 31.1 pg (ref 26.6–33.0)
MCHC: 33.1 g/dL (ref 31.5–35.7)
MCV: 94 fL (ref 79–97)
Monocytes Absolute: 0.9 10*3/uL (ref 0.1–0.9)
Monocytes: 14 %
Neutrophils Absolute: 3.7 10*3/uL (ref 1.4–7.0)
Neutrophils: 59 %
Platelets: 199 10*3/uL (ref 150–450)
RBC: 4.28 x10E6/uL (ref 3.77–5.28)
RDW: 13.1 % (ref 11.7–15.4)
WBC: 6.3 10*3/uL (ref 3.4–10.8)

## 2023-07-20 LAB — TSH: TSH: 1.74 u[IU]/mL (ref 0.450–4.500)

## 2023-07-20 LAB — T4, FREE: Free T4: 1.48 ng/dL (ref 0.82–1.77)

## 2023-07-26 ENCOUNTER — Other Ambulatory Visit: Payer: Self-pay | Admitting: Family Medicine

## 2023-07-26 DIAGNOSIS — E039 Hypothyroidism, unspecified: Secondary | ICD-10-CM

## 2023-07-26 MED ORDER — LEVOTHYROXINE SODIUM 88 MCG PO TABS
88.0000 ug | ORAL_TABLET | Freq: Every day | ORAL | 1 refills | Status: AC
Start: 2023-07-26 — End: ?

## 2023-08-29 ENCOUNTER — Ambulatory Visit: Payer: Medicare Other | Admitting: Interventional Cardiology

## 2023-09-03 ENCOUNTER — Ambulatory Visit: Payer: Medicare Other | Admitting: Interventional Cardiology

## 2023-09-27 ENCOUNTER — Other Ambulatory Visit: Payer: Self-pay | Admitting: Family Medicine

## 2023-09-27 DIAGNOSIS — Z1231 Encounter for screening mammogram for malignant neoplasm of breast: Secondary | ICD-10-CM

## 2023-09-27 DIAGNOSIS — M810 Age-related osteoporosis without current pathological fracture: Secondary | ICD-10-CM | POA: Diagnosis not present

## 2023-09-27 DIAGNOSIS — Z23 Encounter for immunization: Secondary | ICD-10-CM | POA: Diagnosis not present

## 2023-09-27 DIAGNOSIS — R7989 Other specified abnormal findings of blood chemistry: Secondary | ICD-10-CM | POA: Diagnosis not present

## 2023-09-27 DIAGNOSIS — E039 Hypothyroidism, unspecified: Secondary | ICD-10-CM | POA: Diagnosis not present

## 2023-09-27 DIAGNOSIS — E7800 Pure hypercholesterolemia, unspecified: Secondary | ICD-10-CM | POA: Diagnosis not present

## 2023-10-26 ENCOUNTER — Ambulatory Visit: Payer: Medicare Other

## 2023-11-06 ENCOUNTER — Telehealth: Payer: Self-pay | Admitting: Family Medicine

## 2023-11-06 NOTE — Telephone Encounter (Signed)
Thank you for the update, PCP has been changed to [facility]

## 2023-11-06 NOTE — Telephone Encounter (Signed)
Spoke with patient to to schedule her AWV.  She stated she moved to [place] and is seeing [facility]

## 2023-11-10 DIAGNOSIS — H5203 Hypermetropia, bilateral: Secondary | ICD-10-CM | POA: Diagnosis not present

## 2023-11-16 ENCOUNTER — Telehealth: Payer: Self-pay | Admitting: Interventional Cardiology

## 2023-11-16 MED ORDER — ROSUVASTATIN CALCIUM 10 MG PO TABS: 10.0000 mg | ORAL_TABLET | Freq: Every day | ORAL | 0 refills | Status: DC

## 2023-11-16 NOTE — Telephone Encounter (Signed)
*  STAT* If patient is at the pharmacy, call can be transferred to refill team.   1. Which medications need to be refilled? (please list name of each medication and dose if known)   rosuvastatin (CRESTOR) 10 MG tablet   2. Would you like to learn more about the convenience, safety, & potential cost savings by using the [facility]?   3. Are you open to using the [facility] (Type [facility]. ).  4. Which pharmacy/location (including street and city if local pharmacy) is medication to be sent to?  [id number] - [place], [place] - [address] [place] [address] [place] [facility]   5. Do they need a 30 day or 90 day supply?   90 day  Patient stated she has 6 tablets left.

## 2023-11-26 ENCOUNTER — Encounter: Payer: Self-pay | Admitting: Cardiovascular Disease

## 2023-11-26 NOTE — Progress Notes (Signed)
  Cardiology Office Note:  .   Date:  11/28/2023  ID:  Lori Montoya, DOB 03/22/1951, MRN [id number] PCP: Stephanie Charlene CROME, MD  [facility] Providers Cardiologist:  Aleene Passe, MD {  History of Present Illness: .    Jan. 8, 2025   Lori Montoya is a 73 y.o. female former patient of Dr. Dann I am meeting her for the first time today   Hx of palpitations, HLD   Has not been exercising .   Recently moved and has not been exercising since the move   [name] mobile suggested atrial fib Review of the tracings shows NSR with frequent PACs   Quit smoking 15 years ago  Works in her husbands pharmacy     ROS:    Studies Reviewed: .          Risk Assessment/Calculations:     HYPERTENSION CONTROL Vitals:   11/28/23 1404 11/28/23 1428  BP: (!) 142/74 (!) 148/60    The patient's blood pressure is elevated above target today.  In order to address the patient's elevated BP: A new medication was prescribed today.          Physical Exam:   VS:  BP (!) 148/60   Pulse 83   Ht 5' 7 (1.702 m)   Wt 167 lb 12.8 oz (76.1 kg)   SpO2 97%   BMI 26.28 kg/m    Wt Readings from Last 3 Encounters:  11/28/23 167 lb 12.8 oz (76.1 kg)  07/16/23 165 lb (74.8 kg)  03/05/23 167 lb 3.2 oz (75.8 kg)    GEN: Well nourished, well developed in no acute distress NECK: No JVD; No carotid bruits CARDIAC: RRR, no murmurs, rubs, gallops RESPIRATORY:  Clear to auscultation without rales, wheezing or rhonchi  ABDOMEN: Soft, non-tender, non-distended EXTREMITIES:  No edema; No deformity   ASSESSMENT AND PLAN: .    Premature atrial contractions: Jamina has very frequent premature atrial contractions.  These are symptomatic at times.  Her blood pressure is also a little on the high side.  Will start her on metoprolol  25 mg twice a day.  2.  Hypertension: Blood pressure is moderately elevated.  She still eats quite a bit of salt.  Will have her reduce her salt intake.  I will see her  again in 2 to 3 months.  Anticipate starting her on possibly ARB and/or diuretic at that time.       Dispo:   Signed, Aleene Passe, MD

## 2023-11-28 ENCOUNTER — Encounter: Payer: Self-pay | Admitting: Cardiovascular Disease

## 2023-11-28 ENCOUNTER — Ambulatory Visit: Payer: Medicare Other | Attending: Interventional Cardiology | Admitting: Cardiovascular Disease

## 2023-11-28 VITALS — BP 148/60 | HR 83 | Ht 67.0 in | Wt 167.8 lb

## 2023-11-28 DIAGNOSIS — I491 Atrial premature depolarization: Secondary | ICD-10-CM | POA: Diagnosis not present

## 2023-11-28 MED ORDER — METOPROLOL TARTRATE 25 MG PO TABS: 25.0000 mg | ORAL_TABLET | Freq: Two times a day (BID) | ORAL | 3 refills | Status: DC

## 2023-11-28 NOTE — Patient Instructions (Signed)
 Medication Instructions:  Your physician has recommended you make the following change in your medication:   1) START metoprolol  tartrate (Lopressor ) 25 mg twice daily  *If you need a refill on your cardiac medications before your next appointment, please call your pharmacy*  Lab Work: None ordered today.  Testing/Procedures: None ordered today.  Follow-Up: At [facility], you and your health needs are our priority.  As part of our continuing mission to provide you with exceptional heart care, we have created designated Provider Care Teams.  These Care Teams include your primary Cardiologist (physician) and Advanced Practice Providers (APPs -  Physician Assistants and Nurse Practitioners) who all work together to provide you with the care you need, when you need it.  Your next appointment:   2-3 month(s)  The format for your next appointment:   In Person  Provider:   Aleene Passe, MD

## 2023-12-10 ENCOUNTER — Other Ambulatory Visit: Payer: Self-pay | Admitting: Cardiovascular Disease

## 2023-12-27 ENCOUNTER — Encounter: Payer: Self-pay | Admitting: Family Medicine

## 2023-12-27 DIAGNOSIS — K080 Exfoliation of teeth due to systemic causes: Secondary | ICD-10-CM | POA: Diagnosis not present

## 2024-02-21 ENCOUNTER — Encounter: Payer: Self-pay | Admitting: Cardiovascular Disease

## 2024-02-21 NOTE — Progress Notes (Unsigned)
  Cardiology Office Note:  .   Date:  02/22/2024  ID:  Lori Montoya, DOB Jun 06, 1951, MRN [id number] PCP: Ailene Ravel, MD  [facility] Providers Cardiologist:  Kristeen Miss, MD {  History of Present Illness: .    Jan. 8, 2025   Lori Montoya is a 73 y.o. female former patient of Dr. Eldridge Dace I am meeting her for the first time today   Hx of palpitations, HLD   Has not been exercising .   Recently moved and has not been exercising since the move   Her Lori Montoya mobile suggested atrial fib Review of the tracings shows NSR with frequent PACs   Quit smoking 15 years ago  Works in her husbands pharmacy   We started metoprolol 25 BID for her PACs   February 22, 2024 Lori Montoya is seen for follow up of her palpitations  Previously seen by Dr. Eldridge Dace BP is a bit elevated today  She never started the metoprolol  BP has been fairly well controlled at home  She has reduced her salt   Dr. Nathanial Rancher manages her lipids       ROS:    Studies Reviewed: .          Risk Assessment/Calculations:      Physical Exam:     Physical Exam: Blood pressure 135/70, pulse 68, height 5\' 7"  (1.702 m), weight 169 lb 12.8 oz (77 kg), SpO2 97%.       GEN:  Well nourished, well developed in no acute distress HEENT: Normal NECK: No JVD; No carotid bruits LYMPHATICS: No lymphadenopathy CARDIAC: RRR , no murmurs, rubs, gallops RESPIRATORY:  Clear to auscultation without rales, wheezing or rhonchi  ABDOMEN: Soft, non-tender, non-distended MUSCULOSKELETAL:  No edema; No deformity  SKIN: Warm and dry NEUROLOGIC:  Alert and oriented x 3   ASSESSMENT AND PLAN: .    Premature atrial contractions:   I add metoprolol at her last visit but she did not start it yet.    Will change it to PRN .   2.  Hypertension:     She is trying to reduce her salt .Marland Kitchen  Has started exercising .     Dispo:  1  year    Signed, Kristeen Miss, MD

## 2024-02-22 ENCOUNTER — Encounter: Payer: Self-pay | Admitting: Cardiovascular Disease

## 2024-02-22 ENCOUNTER — Ambulatory Visit: Payer: Medicare Other | Attending: Internal Medicine | Admitting: Cardiovascular Disease

## 2024-02-22 VITALS — BP 135/70 | HR 68 | Ht 67.0 in | Wt 169.8 lb

## 2024-02-22 DIAGNOSIS — R002 Palpitations: Secondary | ICD-10-CM | POA: Diagnosis not present

## 2024-02-22 DIAGNOSIS — E782 Mixed hyperlipidemia: Secondary | ICD-10-CM | POA: Diagnosis not present

## 2024-02-22 MED ORDER — METOPROLOL TARTRATE 25 MG PO TABS: ORAL_TABLET | ORAL | 1 refills | Status: DC

## 2024-02-22 NOTE — Patient Instructions (Signed)
 Medication Instructions:  CHANGE Metoprolol to as needed only *If you need a refill on your cardiac medications before your next appointment, please call your pharmacy*  Follow-Up: At [facility], you and your health needs are our priority.  As part of our continuing mission to provide you with exceptional heart care, our providers are all part of one team.  This team includes your primary Cardiologist (physician) and Advanced Practice Providers or APPs (Physician Assistants and Nurse Practitioners) who all work together to provide you with the care you need, when you need it.  Your next appointment:   1 year(s)  Provider:   Kristeen Miss, MD         1st Floor: - Lobby - Registration  - Pharmacy  - Lab - Cafe  2nd Floor: - PV Lab - Diagnostic Testing (echo, CT, nuclear med)  3rd Floor: - Vacant  4th Floor: - TCTS (cardiothoracic surgery) - AFib Clinic - Structural Heart Clinic - Vascular Surgery  - Vascular Ultrasound  5th Floor: - HeartCare Cardiology (general and EP) - Clinical Pharmacy for coumadin, hypertension, lipid, weight-loss medications, and med management appointments    Valet parking services will be available as well.

## 2024-02-26 ENCOUNTER — Ambulatory Visit
Admission: RE | Admit: 2024-02-26 | Discharge: 2024-02-26 | Disposition: A | Discharge status: Home / Self Care | Payer: Medicare Other | Source: Ambulatory Visit | Attending: Family Medicine | Admitting: Family Medicine

## 2024-02-26 DIAGNOSIS — Z1231 Encounter for screening mammogram for malignant neoplasm of breast: Secondary | ICD-10-CM

## 2024-03-26 ENCOUNTER — Other Ambulatory Visit: Payer: Self-pay | Admitting: Family Medicine

## 2024-03-26 DIAGNOSIS — E7800 Pure hypercholesterolemia, unspecified: Secondary | ICD-10-CM | POA: Diagnosis not present

## 2024-03-26 DIAGNOSIS — E559 Vitamin D deficiency, unspecified: Secondary | ICD-10-CM | POA: Diagnosis not present

## 2024-03-26 DIAGNOSIS — L659 Nonscarring hair loss, unspecified: Secondary | ICD-10-CM | POA: Diagnosis not present

## 2024-03-26 DIAGNOSIS — E2839 Other primary ovarian failure: Secondary | ICD-10-CM

## 2024-03-26 DIAGNOSIS — E039 Hypothyroidism, unspecified: Secondary | ICD-10-CM | POA: Diagnosis not present

## 2024-03-26 DIAGNOSIS — Z131 Encounter for screening for diabetes mellitus: Secondary | ICD-10-CM | POA: Diagnosis not present

## 2024-03-26 DIAGNOSIS — Z1331 Encounter for screening for depression: Secondary | ICD-10-CM | POA: Diagnosis not present

## 2024-03-26 DIAGNOSIS — R7989 Other specified abnormal findings of blood chemistry: Secondary | ICD-10-CM | POA: Diagnosis not present

## 2024-03-26 DIAGNOSIS — Z9181 History of falling: Secondary | ICD-10-CM | POA: Diagnosis not present

## 2024-03-26 DIAGNOSIS — M810 Age-related osteoporosis without current pathological fracture: Secondary | ICD-10-CM | POA: Diagnosis not present

## 2024-03-26 DIAGNOSIS — Z139 Encounter for screening, unspecified: Secondary | ICD-10-CM | POA: Diagnosis not present

## 2024-03-27 ENCOUNTER — Encounter: Payer: Self-pay | Admitting: Family Medicine

## 2024-04-18 DIAGNOSIS — I491 Atrial premature depolarization: Secondary | ICD-10-CM | POA: Diagnosis not present

## 2024-04-18 DIAGNOSIS — Z1211 Encounter for screening for malignant neoplasm of colon: Secondary | ICD-10-CM | POA: Diagnosis not present

## 2024-05-07 DIAGNOSIS — K635 Polyp of colon: Secondary | ICD-10-CM | POA: Diagnosis not present

## 2024-05-07 DIAGNOSIS — Z1211 Encounter for screening for malignant neoplasm of colon: Secondary | ICD-10-CM | POA: Diagnosis not present

## 2024-05-17 ENCOUNTER — Other Ambulatory Visit: Payer: Self-pay | Admitting: Cardiovascular Disease

## 2024-07-01 DIAGNOSIS — K080 Exfoliation of teeth due to systemic causes: Secondary | ICD-10-CM | POA: Diagnosis not present

## 2024-07-10 DIAGNOSIS — H00019 Hordeolum externum unspecified eye, unspecified eyelid: Secondary | ICD-10-CM | POA: Diagnosis not present

## 2024-08-26 ENCOUNTER — Ambulatory Visit (HOSPITAL_BASED_OUTPATIENT_CLINIC_OR_DEPARTMENT_OTHER)
Admission: RE | Admit: 2024-08-26 | Discharge: 2024-08-26 | Disposition: A | Discharge status: Home / Self Care | Source: Ambulatory Visit | Attending: Family Medicine | Admitting: Family Medicine

## 2024-08-26 DIAGNOSIS — E2839 Other primary ovarian failure: Secondary | ICD-10-CM | POA: Diagnosis not present

## 2024-08-26 DIAGNOSIS — M810 Age-related osteoporosis without current pathological fracture: Secondary | ICD-10-CM | POA: Diagnosis not present

## 2024-08-26 DIAGNOSIS — Z780 Asymptomatic menopausal state: Secondary | ICD-10-CM | POA: Diagnosis not present

## 2024-09-24 DIAGNOSIS — E559 Vitamin D deficiency, unspecified: Secondary | ICD-10-CM | POA: Diagnosis not present

## 2024-09-24 DIAGNOSIS — E039 Hypothyroidism, unspecified: Secondary | ICD-10-CM | POA: Diagnosis not present

## 2024-09-24 DIAGNOSIS — Z23 Encounter for immunization: Secondary | ICD-10-CM | POA: Diagnosis not present

## 2024-09-24 DIAGNOSIS — R7989 Other specified abnormal findings of blood chemistry: Secondary | ICD-10-CM | POA: Diagnosis not present

## 2024-09-24 DIAGNOSIS — E7800 Pure hypercholesterolemia, unspecified: Secondary | ICD-10-CM | POA: Diagnosis not present

## 2024-09-24 DIAGNOSIS — M810 Age-related osteoporosis without current pathological fracture: Secondary | ICD-10-CM | POA: Diagnosis not present

## 2024-09-25 LAB — LAB REPORT - SCANNED: EGFR: 78

## 2024-12-01 ENCOUNTER — Other Ambulatory Visit: Payer: Self-pay | Admitting: Physician Assistant

## 2024-12-01 MED ORDER — ROSUVASTATIN CALCIUM 10 MG PO TABS: 10.0000 mg | ORAL_TABLET | Freq: Every day | ORAL | 1 refills | Status: AC

## 2024-12-15 ENCOUNTER — Other Ambulatory Visit

## 2025-02-20 ENCOUNTER — Ambulatory Visit: Admitting: Internal Medicine
